# Patient Record
Sex: Female | Born: 1975 | Race: Black or African American | Hispanic: No | Marital: Single | Smoking: Current every day smoker
Health system: Southern US, Community
[De-identification: ages and names within clinical notes are randomized; demographics above are authoritative.]

## PROBLEM LIST (undated history)

## (undated) DIAGNOSIS — D696 Thrombocytopenia, unspecified: Secondary | ICD-10-CM

## (undated) DIAGNOSIS — F319 Bipolar disorder, unspecified: Secondary | ICD-10-CM

## (undated) DIAGNOSIS — K859 Acute pancreatitis without necrosis or infection, unspecified: Secondary | ICD-10-CM

## (undated) DIAGNOSIS — F1010 Alcohol abuse, uncomplicated: Secondary | ICD-10-CM

## (undated) DIAGNOSIS — F1910 Other psychoactive substance abuse, uncomplicated: Secondary | ICD-10-CM

## (undated) HISTORY — PX: NO PAST SURGERIES: SHX2092

---

## 2011-10-29 ENCOUNTER — Emergency Department: Payer: Self-pay | Admitting: Emergency Medicine

## 2012-02-16 ENCOUNTER — Inpatient Hospital Stay: Payer: Self-pay | Admitting: Internal Medicine

## 2012-02-16 LAB — SALICYLATE LEVEL
Salicylates, Serum: 4.2 mg/dL — ABNORMAL HIGH
Salicylates, Serum: 3.2 mg/dL — ABNORMAL HIGH
Salicylates, Serum: <1.7 mg/dL

## 2012-02-16 LAB — COMPREHENSIVE METABOLIC PANEL
Albumin: 4.3 g/dL (ref 3.4–5.0)
Alkaline Phosphatase: 77 U/L (ref 50–136)
Anion Gap: 34 — ABNORMAL HIGH (ref 7–16)
Bilirubin,Total: 0.9 mg/dL (ref 0.2–1.0)
Calcium, Total: 8.4 mg/dL — ABNORMAL LOW (ref 8.5–10.1)
Chloride: 95 mmol/L — ABNORMAL LOW (ref 98–107)
Co2: 4 mmol/L — CL (ref 21–32)
Creatinine: 0.52 mg/dL — ABNORMAL LOW (ref 0.60–1.30)
Glucose: 78 mg/dL (ref 65–99)
Osmolality: 263 (ref 275–301)
Potassium: 3.8 mmol/L (ref 3.5–5.1)
Sodium: 133 mmol/L — ABNORMAL LOW (ref 136–145)
Total Protein: 9.5 g/dL — ABNORMAL HIGH (ref 6.4–8.2)

## 2012-02-16 LAB — ETHANOL
Ethanol %: 0.05 % (ref 0.000–0.080)
Ethanol %: <0.003 % (ref 0.000–0.080)

## 2012-02-16 LAB — DRUG SCREEN, URINE
Amphetamines, Ur Screen: NEGATIVE (ref ?–1000)
Cocaine Metabolite,Ur ~~LOC~~: NEGATIVE (ref ?–300)
MDMA (Ecstasy)Ur Screen: NEGATIVE (ref ?–500)
Methadone, Ur Screen: NEGATIVE (ref ?–300)
Phencyclidine (PCP) Ur S: NEGATIVE (ref ?–25)

## 2012-02-16 LAB — URINALYSIS, COMPLETE
Bacteria: NONE SEEN
Bilirubin,UR: NEGATIVE
Hyaline Cast: 3
Leukocyte Esterase: NEGATIVE
Ph: 6 (ref 4.5–8.0)
Protein: 30
RBC,UR: 1 /HPF (ref 0–5)
Specific Gravity: 1.005 (ref 1.003–1.030)
Squamous Epithelial: <1
WBC UR: <1 /HPF (ref 0–5)

## 2012-02-16 LAB — FOLATE: Folic Acid: 4.7 ng/mL (ref 3.1–100.0)

## 2012-02-16 LAB — BASIC METABOLIC PANEL
Anion Gap: 12 (ref 7–16)
BUN: 4 mg/dL — ABNORMAL LOW (ref 7–18)
BUN: 3 mg/dL — ABNORMAL LOW (ref 7–18)
Calcium, Total: 8.1 mg/dL — ABNORMAL LOW (ref 8.5–10.1)
Creatinine: 0.46 mg/dL — ABNORMAL LOW (ref 0.60–1.30)
Creatinine: 0.32 mg/dL — ABNORMAL LOW (ref 0.60–1.30)
EGFR (Non-African Amer.): >60
EGFR (Non-African Amer.): >60
Glucose: 136 mg/dL — ABNORMAL HIGH (ref 65–99)
Potassium: 3.4 mmol/L — ABNORMAL LOW (ref 3.5–5.1)
Potassium: 3 mmol/L — ABNORMAL LOW (ref 3.5–5.1)
Sodium: 135 mmol/L — ABNORMAL LOW (ref 136–145)

## 2012-02-16 LAB — LACTATE DEHYDROGENASE: LDH: 296 U/L — ABNORMAL HIGH (ref 84–246)

## 2012-02-16 LAB — CBC WITH DIFFERENTIAL/PLATELET
Basophil #: 0.1 10*3/uL (ref 0.0–0.1)
Eosinophil #: 0 10*3/uL (ref 0.0–0.7)
HGB: 13.9 g/dL (ref 12.0–16.0)
Lymphocyte #: 0.6 10*3/uL — ABNORMAL LOW (ref 1.0–3.6)
MCH: 35.6 pg — ABNORMAL HIGH (ref 26.0–34.0)
MCHC: 32.3 g/dL (ref 32.0–36.0)
Monocyte %: 3.9 %
Neutrophil #: 13.8 10*3/uL — ABNORMAL HIGH (ref 1.4–6.5)
Neutrophil %: 91.8 %
Platelet: 177 10*3/uL (ref 150–440)
RDW: 14.5 % (ref 11.5–14.5)
WBC: 15 10*3/uL — ABNORMAL HIGH (ref 3.6–11.0)

## 2012-02-16 LAB — AMMONIA: Ammonia, Plasma: 92 mcmol/L — ABNORMAL HIGH (ref 11–32)

## 2012-02-16 LAB — LIPASE, BLOOD: Lipase: 108 U/L (ref 73–393)

## 2012-02-16 LAB — ACETAMINOPHEN LEVEL: Acetaminophen: <2 ug/mL

## 2012-02-17 LAB — COMPREHENSIVE METABOLIC PANEL
Albumin: 3.4 g/dL (ref 3.4–5.0)
Creatinine: 0.58 mg/dL — ABNORMAL LOW (ref 0.60–1.30)
EGFR (African American): >60
EGFR (Non-African Amer.): >60
Potassium: 2.8 mmol/L — ABNORMAL LOW (ref 3.5–5.1)
SGPT (ALT): 32 U/L
Total Protein: 7 g/dL (ref 6.4–8.2)

## 2012-02-17 LAB — CBC WITH DIFFERENTIAL/PLATELET
Basophil %: 0.1 %
Eosinophil #: 0 10*3/uL (ref 0.0–0.7)
Eosinophil %: 0.3 %
Lymphocyte #: 1.7 10*3/uL (ref 1.0–3.6)
Lymphocyte %: 23.7 %
MCHC: 35.5 g/dL (ref 32.0–36.0)
MCV: 105 fL — ABNORMAL HIGH (ref 80–100)
Monocyte #: 0.5 x10 3/mm (ref 0.2–0.9)
Neutrophil #: 4.9 10*3/uL (ref 1.4–6.5)
Neutrophil %: 68.9 %
RBC: 2.98 10*6/uL — ABNORMAL LOW (ref 3.80–5.20)
WBC: 7.1 10*3/uL (ref 3.6–11.0)

## 2012-02-17 LAB — HEMOGLOBIN A1C: Hemoglobin A1C: 4.6 % (ref 4.2–6.3)

## 2012-02-17 LAB — SALICYLATE LEVEL: Salicylates, Serum: <1.7 mg/dL

## 2012-02-18 LAB — BASIC METABOLIC PANEL
Anion Gap: 5 — ABNORMAL LOW (ref 7–16)
BUN: 3 mg/dL — ABNORMAL LOW (ref 7–18)
Calcium, Total: 8.4 mg/dL — ABNORMAL LOW (ref 8.5–10.1)
EGFR (African American): >60
EGFR (Non-African Amer.): >60
Glucose: 99 mg/dL (ref 65–99)
Osmolality: 274 (ref 275–301)
Potassium: 3.4 mmol/L — ABNORMAL LOW (ref 3.5–5.1)

## 2012-02-18 LAB — AMMONIA: Ammonia, Plasma: <25 mcmol/L (ref 11–32)

## 2012-02-19 LAB — CBC WITH DIFFERENTIAL/PLATELET
Basophil #: 0 10*3/uL (ref 0.0–0.1)
Basophil %: 0.3 %
Eosinophil #: 0.2 10*3/uL (ref 0.0–0.7)
Eosinophil %: 1.9 %
Lymphocyte #: 1.8 10*3/uL (ref 1.0–3.6)
MCHC: 33.7 g/dL (ref 32.0–36.0)
Monocyte #: 0.5 x10 3/mm (ref 0.2–0.9)
Neutrophil #: 5.8 10*3/uL (ref 1.4–6.5)
RBC: 2.39 10*6/uL — ABNORMAL LOW (ref 3.80–5.20)

## 2012-02-19 LAB — BASIC METABOLIC PANEL
BUN: 3 mg/dL — ABNORMAL LOW (ref 7–18)
Calcium, Total: 8.3 mg/dL — ABNORMAL LOW (ref 8.5–10.1)
Chloride: 112 mmol/L — ABNORMAL HIGH (ref 98–107)
Co2: 25 mmol/L (ref 21–32)
Creatinine: 0.48 mg/dL — ABNORMAL LOW (ref 0.60–1.30)
EGFR (African American): >60
EGFR (Non-African Amer.): >60
Glucose: 90 mg/dL (ref 65–99)
Potassium: 3.7 mmol/L (ref 3.5–5.1)
Sodium: 146 mmol/L — ABNORMAL HIGH (ref 136–145)

## 2012-02-20 ENCOUNTER — Inpatient Hospital Stay: Payer: Self-pay | Admitting: Psychiatry

## 2012-02-20 LAB — CBC WITH DIFFERENTIAL/PLATELET
Basophil #: 0.1 10*3/uL (ref 0.0–0.1)
Basophil %: 1.3 %
Eosinophil #: 0.2 10*3/uL (ref 0.0–0.7)
HCT: 27 % — ABNORMAL LOW (ref 35.0–47.0)
MCH: 36.8 pg — ABNORMAL HIGH (ref 26.0–34.0)
Neutrophil #: 8.3 10*3/uL — ABNORMAL HIGH (ref 1.4–6.5)
Neutrophil %: 80.2 %
RDW: 14.2 % (ref 11.5–14.5)

## 2012-02-20 LAB — COMPREHENSIVE METABOLIC PANEL
Albumin: 3.1 g/dL — ABNORMAL LOW (ref 3.4–5.0)
Alkaline Phosphatase: 64 U/L (ref 50–136)
BUN: 4 mg/dL — ABNORMAL LOW (ref 7–18)
Bilirubin,Total: 0.5 mg/dL (ref 0.2–1.0)
Co2: 22 mmol/L (ref 21–32)
EGFR (African American): >60
Osmolality: 280 (ref 275–301)
Potassium: 4 mmol/L (ref 3.5–5.1)
SGOT(AST): 55 U/L — ABNORMAL HIGH (ref 15–37)
SGPT (ALT): 39 U/L
Sodium: 142 mmol/L (ref 136–145)
Total Protein: 6.8 g/dL (ref 6.4–8.2)

## 2012-02-21 LAB — URINALYSIS, COMPLETE
Glucose,UR: NEGATIVE mg/dL (ref 0–75)
Protein: NEGATIVE
RBC,UR: 8 /HPF (ref 0–5)
Specific Gravity: 1.017 (ref 1.003–1.030)
Squamous Epithelial: 2

## 2012-02-22 LAB — CBC WITH DIFFERENTIAL/PLATELET
Basophil #: 0 10*3/uL (ref 0.0–0.1)
Eosinophil %: 1.6 %
HCT: 31.5 % — ABNORMAL LOW (ref 35.0–47.0)
HGB: 10.7 g/dL — ABNORMAL LOW (ref 12.0–16.0)
Lymphocyte #: 1.8 10*3/uL (ref 1.0–3.6)
Lymphocyte %: 22 %
MCH: 36.5 pg — ABNORMAL HIGH (ref 26.0–34.0)
MCHC: 33.9 g/dL (ref 32.0–36.0)
MCV: 108 fL — ABNORMAL HIGH (ref 80–100)
Monocyte #: 1 x10 3/mm — ABNORMAL HIGH (ref 0.2–0.9)
Neutrophil %: 64.1 %

## 2012-02-23 LAB — OCCULT BLOOD X 1 CARD TO LAB, STOOL: Occult Blood, Feces: NEGATIVE

## 2012-02-25 LAB — URINALYSIS, COMPLETE
Bilirubin,UR: NEGATIVE
Blood: NEGATIVE
Ph: 6 (ref 4.5–8.0)
Protein: NEGATIVE
Squamous Epithelial: 1
WBC UR: 2 /HPF (ref 0–5)

## 2012-02-25 LAB — CBC WITH DIFFERENTIAL/PLATELET
Basophil %: 0.7 %
Eosinophil %: 0.7 %
HCT: 30.3 % — ABNORMAL LOW (ref 35.0–47.0)
HGB: 10.3 g/dL — ABNORMAL LOW (ref 12.0–16.0)
Lymphocyte #: 1.7 10*3/uL (ref 1.0–3.6)
MCH: 36.3 pg — ABNORMAL HIGH (ref 26.0–34.0)
MCHC: 34 g/dL (ref 32.0–36.0)
MCV: 107 fL — ABNORMAL HIGH (ref 80–100)
Monocyte #: 1.1 x10 3/mm — ABNORMAL HIGH (ref 0.2–0.9)
Monocyte %: 14.9 %
Neutrophil #: 4.2 10*3/uL (ref 1.4–6.5)
Neutrophil %: 59.9 %
Platelet: 375 10*3/uL (ref 150–440)
RDW: 14.4 % (ref 11.5–14.5)
WBC: 7.1 10*3/uL (ref 3.6–11.0)

## 2012-02-25 LAB — COMPREHENSIVE METABOLIC PANEL
Albumin: 3.4 g/dL (ref 3.4–5.0)
Alkaline Phosphatase: 62 U/L (ref 50–136)
Anion Gap: 9 (ref 7–16)
Calcium, Total: 8.9 mg/dL (ref 8.5–10.1)
Co2: 24 mmol/L (ref 21–32)
EGFR (Non-African Amer.): >60
Glucose: 78 mg/dL (ref 65–99)
Osmolality: 276 (ref 275–301)
Potassium: 3.7 mmol/L (ref 3.5–5.1)
SGPT (ALT): 36 U/L
Sodium: 140 mmol/L (ref 136–145)
Total Protein: 7.5 g/dL (ref 6.4–8.2)

## 2012-02-25 LAB — MAGNESIUM: Magnesium: 2 mg/dL

## 2012-02-26 LAB — CBC WITH DIFFERENTIAL/PLATELET
Basophil #: 0 10*3/uL (ref 0.0–0.1)
Basophil %: 0.7 %
Eosinophil #: 0.1 10*3/uL (ref 0.0–0.7)
Eosinophil %: 1.1 %
Lymphocyte %: 25.9 %
MCH: 36.6 pg — ABNORMAL HIGH (ref 26.0–34.0)
MCHC: 33.3 g/dL (ref 32.0–36.0)
Monocyte %: 11.2 %
Platelet: 394 10*3/uL (ref 150–440)
RBC: 3.01 10*6/uL — ABNORMAL LOW (ref 3.80–5.20)
RDW: 14.5 % (ref 11.5–14.5)
WBC: 6.8 10*3/uL (ref 3.6–11.0)

## 2012-02-26 LAB — URINE CULTURE

## 2012-02-26 LAB — BASIC METABOLIC PANEL
BUN: 4 mg/dL — ABNORMAL LOW (ref 7–18)
Calcium, Total: 8.8 mg/dL (ref 8.5–10.1)
Chloride: 105 mmol/L (ref 98–107)
Co2: 24 mmol/L (ref 21–32)
Creatinine: 0.55 mg/dL — ABNORMAL LOW (ref 0.60–1.30)
EGFR (African American): >60
EGFR (Non-African Amer.): >60
Osmolality: 276 (ref 275–301)

## 2012-06-29 ENCOUNTER — Inpatient Hospital Stay: Payer: Self-pay | Admitting: Student

## 2012-06-29 LAB — COMPREHENSIVE METABOLIC PANEL
Albumin: 3.9 g/dL (ref 3.4–5.0)
Alkaline Phosphatase: 77 U/L (ref 50–136)
BUN: 5 mg/dL — ABNORMAL LOW (ref 7–18)
Calcium, Total: 9 mg/dL (ref 8.5–10.1)
Co2: 9 mmol/L — CL (ref 21–32)
Creatinine: 0.59 mg/dL — ABNORMAL LOW (ref 0.60–1.30)
EGFR (Non-African Amer.): >60
Osmolality: 263 (ref 275–301)
Potassium: 4 mmol/L (ref 3.5–5.1)
SGOT(AST): 46 U/L — ABNORMAL HIGH (ref 15–37)
SGPT (ALT): 25 U/L (ref 12–78)
Total Protein: 9.7 g/dL — ABNORMAL HIGH (ref 6.4–8.2)

## 2012-06-29 LAB — ACETAMINOPHEN LEVEL: Acetaminophen: <2 ug/mL

## 2012-06-29 LAB — DRUG SCREEN, URINE
Amphetamines, Ur Screen: NEGATIVE (ref ?–1000)
Benzodiazepine, Ur Scrn: NEGATIVE (ref ?–200)
Cannabinoid 50 Ng, Ur ~~LOC~~: POSITIVE (ref ?–50)
MDMA (Ecstasy)Ur Screen: NEGATIVE (ref ?–500)
Phencyclidine (PCP) Ur S: NEGATIVE (ref ?–25)
Tricyclic, Ur Screen: NEGATIVE (ref ?–1000)

## 2012-06-29 LAB — BASIC METABOLIC PANEL
Anion Gap: 12 (ref 7–16)
BUN: 2 mg/dL — ABNORMAL LOW (ref 7–18)
Chloride: 92 mmol/L — ABNORMAL LOW (ref 98–107)
Creatinine: 0.5 mg/dL — ABNORMAL LOW (ref 0.60–1.30)
EGFR (Non-African Amer.): >60
Glucose: 256 mg/dL — ABNORMAL HIGH (ref 65–99)
Osmolality: 269 (ref 275–301)
Potassium: 3.4 mmol/L — ABNORMAL LOW (ref 3.5–5.1)
Sodium: 132 mmol/L — ABNORMAL LOW (ref 136–145)

## 2012-06-29 LAB — CBC
MCH: 34.8 pg — ABNORMAL HIGH (ref 26.0–34.0)
MCHC: 34 g/dL (ref 32.0–36.0)
Platelet: 352 10*3/uL (ref 150–440)
RDW: 14.3 % (ref 11.5–14.5)
WBC: 23.5 10*3/uL — ABNORMAL HIGH (ref 3.6–11.0)

## 2012-06-29 LAB — URINALYSIS, COMPLETE
Leukocyte Esterase: NEGATIVE
Nitrite: NEGATIVE
Ph: 5 (ref 4.5–8.0)
Protein: 100
RBC,UR: 2 /HPF (ref 0–5)
Specific Gravity: 1.013 (ref 1.003–1.030)
Squamous Epithelial: NONE SEEN

## 2012-06-29 LAB — ETHANOL
Ethanol %: 0.28 % — ABNORMAL HIGH (ref 0.000–0.080)
Ethanol: 280 mg/dL

## 2012-06-29 LAB — TSH: Thyroid Stimulating Horm: 0.28 u[IU]/mL — ABNORMAL LOW

## 2012-06-29 LAB — SALICYLATE LEVEL: Salicylates, Serum: 2.4 mg/dL

## 2012-06-29 LAB — PREGNANCY, URINE: Pregnancy Test, Urine: NEGATIVE m[IU]/mL

## 2012-06-29 LAB — LITHIUM LEVEL: Lithium: <0.2 mmol/L — ABNORMAL LOW

## 2012-06-29 LAB — T4, FREE: Free Thyroxine: 0.71 ng/dL — ABNORMAL LOW (ref 0.76–1.46)

## 2012-06-29 LAB — HEMOGLOBIN: HGB: 12.2 g/dL (ref 12.0–16.0)

## 2012-06-30 LAB — BASIC METABOLIC PANEL
Anion Gap: 8 (ref 7–16)
BUN: 3 mg/dL — ABNORMAL LOW (ref 7–18)
Calcium, Total: 8.2 mg/dL — ABNORMAL LOW (ref 8.5–10.1)
Co2: 35 mmol/L — ABNORMAL HIGH (ref 21–32)
Creatinine: 0.75 mg/dL (ref 0.60–1.30)
EGFR (African American): >60
EGFR (Non-African Amer.): >60
Glucose: 311 mg/dL — ABNORMAL HIGH (ref 65–99)
Potassium: 3.3 mmol/L — ABNORMAL LOW (ref 3.5–5.1)
Sodium: 134 mmol/L — ABNORMAL LOW (ref 136–145)

## 2012-06-30 LAB — CBC WITH DIFFERENTIAL/PLATELET
Basophil #: 0 10*3/uL (ref 0.0–0.1)
Eosinophil #: 0 10*3/uL (ref 0.0–0.7)
Eosinophil %: 0 %
HCT: 33.6 % — ABNORMAL LOW (ref 35.0–47.0)
Lymphocyte #: 1 10*3/uL (ref 1.0–3.6)
MCH: 35.6 pg — ABNORMAL HIGH (ref 26.0–34.0)
MCV: 99 fL (ref 80–100)
Monocyte %: 8.3 %
Neutrophil #: 11.6 10*3/uL — ABNORMAL HIGH (ref 1.4–6.5)
RBC: 3.4 10*6/uL — ABNORMAL LOW (ref 3.80–5.20)
RDW: 14.5 % (ref 11.5–14.5)
WBC: 13.8 10*3/uL — ABNORMAL HIGH (ref 3.6–11.0)

## 2012-06-30 LAB — MAGNESIUM: Magnesium: 1.5 mg/dL — ABNORMAL LOW

## 2012-07-01 LAB — BASIC METABOLIC PANEL
Anion Gap: 7 (ref 7–16)
Anion Gap: 4 — ABNORMAL LOW (ref 7–16)
BUN: 5 mg/dL — ABNORMAL LOW (ref 7–18)
Calcium, Total: 8.7 mg/dL (ref 8.5–10.1)
Chloride: 99 mmol/L (ref 98–107)
Co2: 35 mmol/L — ABNORMAL HIGH (ref 21–32)
Creatinine: 0.62 mg/dL (ref 0.60–1.30)
Creatinine: 0.84 mg/dL (ref 0.60–1.30)
EGFR (African American): >60
EGFR (Non-African Amer.): >60
Glucose: 116 mg/dL — ABNORMAL HIGH (ref 65–99)
Osmolality: 273 (ref 275–301)
Potassium: 3 mmol/L — ABNORMAL LOW (ref 3.5–5.1)
Sodium: 137 mmol/L (ref 136–145)

## 2012-07-01 LAB — CBC WITH DIFFERENTIAL/PLATELET
Basophil #: 0.1 10*3/uL (ref 0.0–0.1)
Basophil %: 0.7 %
Eosinophil #: 0 10*3/uL (ref 0.0–0.7)
Eosinophil %: 0.1 %
HCT: 35.4 % (ref 35.0–47.0)
HGB: 12.4 g/dL (ref 12.0–16.0)
Lymphocyte #: 1.5 10*3/uL (ref 1.0–3.6)
Lymphocyte %: 15.9 %
MCH: 35.4 pg — ABNORMAL HIGH (ref 26.0–34.0)
MCHC: 35.1 g/dL (ref 32.0–36.0)
MCV: 101 fL — ABNORMAL HIGH (ref 80–100)
Monocyte #: 0.7 x10 3/mm (ref 0.2–0.9)
Monocyte %: 7.9 %
Neutrophil #: 7 10*3/uL — ABNORMAL HIGH (ref 1.4–6.5)
Neutrophil %: 75.4 %
RBC: 3.52 10*6/uL — ABNORMAL LOW (ref 3.80–5.20)
RDW: 14.7 % — ABNORMAL HIGH (ref 11.5–14.5)
WBC: 9.3 10*3/uL (ref 3.6–11.0)

## 2012-07-01 LAB — PROTIME-INR
INR: 0.9
Prothrombin Time: 12.8 secs (ref 11.5–14.7)

## 2012-07-29 ENCOUNTER — Emergency Department: Payer: Self-pay

## 2012-07-29 LAB — COMPREHENSIVE METABOLIC PANEL
Alkaline Phosphatase: 55 U/L (ref 50–136)
Anion Gap: 12 (ref 7–16)
Calcium, Total: 8.4 mg/dL — ABNORMAL LOW (ref 8.5–10.1)
Chloride: 104 mmol/L (ref 98–107)
Co2: 20 mmol/L — ABNORMAL LOW (ref 21–32)
Creatinine: 0.58 mg/dL — ABNORMAL LOW (ref 0.60–1.30)
EGFR (African American): >60
EGFR (Non-African Amer.): >60
SGOT(AST): 24 U/L (ref 15–37)
SGPT (ALT): 13 U/L (ref 12–78)
Total Protein: 7.3 g/dL (ref 6.4–8.2)

## 2012-07-29 LAB — CBC
HCT: 35.4 % (ref 35.0–47.0)
HGB: 11.9 g/dL — ABNORMAL LOW (ref 12.0–16.0)
MCH: 34.4 pg — ABNORMAL HIGH (ref 26.0–34.0)
MCHC: 33.6 g/dL (ref 32.0–36.0)
Platelet: 163 10*3/uL (ref 150–440)
RBC: 3.46 10*6/uL — ABNORMAL LOW (ref 3.80–5.20)
RDW: 13.6 % (ref 11.5–14.5)

## 2012-07-29 LAB — URINALYSIS, COMPLETE
Glucose,UR: NEGATIVE mg/dL (ref 0–75)
Ketone: NEGATIVE
Leukocyte Esterase: NEGATIVE
Nitrite: NEGATIVE
Ph: 7 (ref 4.5–8.0)
WBC UR: 1 /HPF (ref 0–5)

## 2012-07-29 LAB — DRUG SCREEN, URINE
Amphetamines, Ur Screen: NEGATIVE (ref ?–1000)
Benzodiazepine, Ur Scrn: NEGATIVE (ref ?–200)
Cocaine Metabolite,Ur ~~LOC~~: NEGATIVE (ref ?–300)
MDMA (Ecstasy)Ur Screen: NEGATIVE (ref ?–500)
Methadone, Ur Screen: NEGATIVE (ref ?–300)
Phencyclidine (PCP) Ur S: NEGATIVE (ref ?–25)
Tricyclic, Ur Screen: NEGATIVE (ref ?–1000)

## 2012-07-29 LAB — ETHANOL
Ethanol %: 0.125 % — ABNORMAL HIGH (ref 0.000–0.080)
Ethanol: 125 mg/dL

## 2012-07-29 LAB — TROPONIN I: Troponin-I: <0.02 ng/mL

## 2012-07-29 LAB — LIPASE, BLOOD: Lipase: 64 U/L — ABNORMAL LOW (ref 73–393)

## 2012-10-09 ENCOUNTER — Observation Stay: Payer: Self-pay | Admitting: Family Medicine

## 2012-10-09 LAB — DRUG SCREEN, URINE
Barbiturates, Ur Screen: NEGATIVE (ref ?–200)
Cocaine Metabolite,Ur ~~LOC~~: NEGATIVE (ref ?–300)
Methadone, Ur Screen: NEGATIVE (ref ?–300)
Opiate, Ur Screen: POSITIVE (ref ?–300)
Phencyclidine (PCP) Ur S: NEGATIVE (ref ?–25)
Tricyclic, Ur Screen: NEGATIVE (ref ?–1000)

## 2012-10-09 LAB — COMPREHENSIVE METABOLIC PANEL
Alkaline Phosphatase: 66 U/L (ref 50–136)
Calcium, Total: 7.8 mg/dL — ABNORMAL LOW (ref 8.5–10.1)
Chloride: 109 mmol/L — ABNORMAL HIGH (ref 98–107)
Co2: 21 mmol/L (ref 21–32)
Creatinine: 0.7 mg/dL (ref 0.60–1.30)
EGFR (African American): >60
EGFR (Non-African Amer.): >60
Potassium: 4.2 mmol/L (ref 3.5–5.1)
SGOT(AST): 33 U/L (ref 15–37)
SGPT (ALT): 17 U/L (ref 12–78)
Sodium: 142 mmol/L (ref 136–145)
Total Protein: 7.6 g/dL (ref 6.4–8.2)

## 2012-10-09 LAB — CBC
HGB: 11 g/dL — ABNORMAL LOW (ref 12.0–16.0)
MCV: 100 fL (ref 80–100)
Platelet: 229 10*3/uL (ref 150–440)
RDW: 14.7 % — ABNORMAL HIGH (ref 11.5–14.5)
WBC: 11.2 10*3/uL — ABNORMAL HIGH (ref 3.6–11.0)

## 2012-10-09 LAB — URINALYSIS, COMPLETE
Nitrite: NEGATIVE
Ph: 8 (ref 4.5–8.0)
Protein: 100
Specific Gravity: 1.032 (ref 1.003–1.030)
WBC UR: 6 /HPF (ref 0–5)

## 2012-10-09 LAB — PREGNANCY, URINE: Pregnancy Test, Urine: NEGATIVE m[IU]/mL

## 2012-10-09 LAB — TROPONIN I: Troponin-I: <0.02 ng/mL

## 2012-10-10 LAB — COMPREHENSIVE METABOLIC PANEL
Albumin: 3.1 g/dL — ABNORMAL LOW (ref 3.4–5.0)
Alkaline Phosphatase: 58 U/L (ref 50–136)
Anion Gap: 9 (ref 7–16)
Calcium, Total: 7.2 mg/dL — ABNORMAL LOW (ref 8.5–10.1)
Co2: 22 mmol/L (ref 21–32)
EGFR (African American): >60
EGFR (Non-African Amer.): >60
Glucose: 166 mg/dL — ABNORMAL HIGH (ref 65–99)
Potassium: 2.6 mmol/L — ABNORMAL LOW (ref 3.5–5.1)
SGOT(AST): 15 U/L (ref 15–37)

## 2012-10-10 LAB — CBC WITH DIFFERENTIAL/PLATELET
Basophil %: 0.7 %
Eosinophil %: 1 %
Lymphocyte %: 25.2 %
MCH: 37.4 pg — ABNORMAL HIGH (ref 26.0–34.0)
Monocyte %: 5.6 %
Neutrophil %: 67.5 %
Platelet: 127 10*3/uL — ABNORMAL LOW (ref 150–440)
WBC: 9.1 10*3/uL (ref 3.6–11.0)

## 2012-10-10 LAB — LITHIUM LEVEL: Lithium: 0.5 mmol/L — ABNORMAL LOW

## 2012-10-16 ENCOUNTER — Emergency Department: Payer: Self-pay | Admitting: Emergency Medicine

## 2012-10-16 LAB — LIPASE, BLOOD: Lipase: 64 U/L — ABNORMAL LOW (ref 73–393)

## 2012-10-16 LAB — CBC
HCT: 32.7 % — ABNORMAL LOW (ref 35.0–47.0)
HGB: 11.4 g/dL — ABNORMAL LOW (ref 12.0–16.0)
MCHC: 34.8 g/dL (ref 32.0–36.0)
Platelet: 280 10*3/uL (ref 150–440)
RBC: 3.25 10*6/uL — ABNORMAL LOW (ref 3.80–5.20)

## 2012-10-16 LAB — COMPREHENSIVE METABOLIC PANEL
Albumin: 3.7 g/dL (ref 3.4–5.0)
Anion Gap: 11 (ref 7–16)
Calcium, Total: 8.8 mg/dL (ref 8.5–10.1)
Chloride: 109 mmol/L — ABNORMAL HIGH (ref 98–107)
Glucose: 117 mg/dL — ABNORMAL HIGH (ref 65–99)
Osmolality: 282 (ref 275–301)
Potassium: 3 mmol/L — ABNORMAL LOW (ref 3.5–5.1)
Total Protein: 7.8 g/dL (ref 6.4–8.2)

## 2012-10-16 LAB — URINALYSIS, COMPLETE
Bacteria: NONE SEEN
Bilirubin,UR: NEGATIVE
Blood: NEGATIVE
Glucose,UR: NEGATIVE mg/dL (ref 0–75)
Nitrite: NEGATIVE
Ph: 9 (ref 4.5–8.0)
Specific Gravity: 1.023 (ref 1.003–1.030)
Squamous Epithelial: 25
WBC UR: 2 /HPF (ref 0–5)

## 2012-10-16 LAB — PREGNANCY, URINE: Pregnancy Test, Urine: NEGATIVE m[IU]/mL

## 2012-10-16 LAB — LITHIUM LEVEL: Lithium: <0.2 mmol/L — ABNORMAL LOW

## 2013-01-28 ENCOUNTER — Emergency Department: Payer: Self-pay | Admitting: Emergency Medicine

## 2013-01-31 ENCOUNTER — Inpatient Hospital Stay: Payer: Self-pay | Admitting: Internal Medicine

## 2013-01-31 LAB — CBC
HCT: 35.4 % (ref 35.0–47.0)
MCH: 37.4 pg — ABNORMAL HIGH (ref 26.0–34.0)
Platelet: 637 10*3/uL — ABNORMAL HIGH (ref 150–440)
RDW: 17 % — ABNORMAL HIGH (ref 11.5–14.5)
WBC: 18.3 10*3/uL — ABNORMAL HIGH (ref 3.6–11.0)

## 2013-01-31 LAB — PREGNANCY, URINE: Pregnancy Test, Urine: NEGATIVE m[IU]/mL

## 2013-01-31 LAB — COMPREHENSIVE METABOLIC PANEL
Alkaline Phosphatase: 88 U/L (ref 50–136)
BUN: 8 mg/dL (ref 7–18)
Bilirubin,Total: 0.6 mg/dL (ref 0.2–1.0)
Calcium, Total: 8.2 mg/dL — ABNORMAL LOW (ref 8.5–10.1)
Chloride: 102 mmol/L (ref 98–107)
Creatinine: 0.45 mg/dL — ABNORMAL LOW (ref 0.60–1.30)
Glucose: 70 mg/dL (ref 65–99)
Osmolality: 274 (ref 275–301)
Potassium: 3.3 mmol/L — ABNORMAL LOW (ref 3.5–5.1)
SGOT(AST): 100 U/L — ABNORMAL HIGH (ref 15–37)
SGPT (ALT): 39 U/L (ref 12–78)
Sodium: 139 mmol/L (ref 136–145)
Total Protein: 8.4 g/dL — ABNORMAL HIGH (ref 6.4–8.2)

## 2013-01-31 LAB — DRUG SCREEN, URINE
Amphetamines, Ur Screen: NEGATIVE (ref ?–1000)
Barbiturates, Ur Screen: NEGATIVE (ref ?–200)
Benzodiazepine, Ur Scrn: NEGATIVE (ref ?–200)
Cannabinoid 50 Ng, Ur ~~LOC~~: POSITIVE (ref ?–50)
Cocaine Metabolite,Ur ~~LOC~~: NEGATIVE (ref ?–300)
MDMA (Ecstasy)Ur Screen: NEGATIVE (ref ?–500)
Methadone, Ur Screen: POSITIVE (ref ?–300)
Tricyclic, Ur Screen: NEGATIVE (ref ?–1000)

## 2013-01-31 LAB — TSH: Thyroid Stimulating Horm: 0.2 u[IU]/mL — ABNORMAL LOW

## 2013-01-31 LAB — BASIC METABOLIC PANEL
BUN: 5 mg/dL — ABNORMAL LOW (ref 7–18)
Calcium, Total: 8 mg/dL — ABNORMAL LOW (ref 8.5–10.1)
Creatinine: 0.63 mg/dL (ref 0.60–1.30)
EGFR (African American): >60
EGFR (Non-African Amer.): >60
Glucose: 180 mg/dL — ABNORMAL HIGH (ref 65–99)
Osmolality: 277 (ref 275–301)
Potassium: 3.4 mmol/L — ABNORMAL LOW (ref 3.5–5.1)

## 2013-01-31 LAB — ETHANOL
Ethanol %: 0.294 % — ABNORMAL HIGH (ref 0.000–0.080)
Ethanol: 294 mg/dL

## 2013-01-31 LAB — SALICYLATE LEVEL: Salicylates, Serum: 2.3 mg/dL

## 2013-01-31 LAB — LITHIUM LEVEL: Lithium: <0.2 mmol/L — ABNORMAL LOW

## 2013-02-01 LAB — COMPREHENSIVE METABOLIC PANEL
Albumin: 2.7 g/dL — ABNORMAL LOW (ref 3.4–5.0)
BUN: 3 mg/dL — ABNORMAL LOW (ref 7–18)
Bilirubin,Total: 1.9 mg/dL — ABNORMAL HIGH (ref 0.2–1.0)
Calcium, Total: 7.7 mg/dL — ABNORMAL LOW (ref 8.5–10.1)
Chloride: 111 mmol/L — ABNORMAL HIGH (ref 98–107)
Co2: 23 mmol/L (ref 21–32)
EGFR (African American): >60
EGFR (Non-African Amer.): >60
SGOT(AST): 50 U/L — ABNORMAL HIGH (ref 15–37)
SGPT (ALT): 23 U/L (ref 12–78)
Total Protein: 5.9 g/dL — ABNORMAL LOW (ref 6.4–8.2)

## 2013-02-01 LAB — CBC WITH DIFFERENTIAL/PLATELET
Basophil #: 0.1 10*3/uL (ref 0.0–0.1)
Eosinophil #: 0 10*3/uL (ref 0.0–0.7)
Eosinophil %: 0 %
HGB: 8.6 g/dL — ABNORMAL LOW (ref 12.0–16.0)
Lymphocyte #: 1.6 10*3/uL (ref 1.0–3.6)
MCH: 36.8 pg — ABNORMAL HIGH (ref 26.0–34.0)
Neutrophil #: 13.4 10*3/uL — ABNORMAL HIGH (ref 1.4–6.5)
Neutrophil %: 86.3 %
Platelet: 302 10*3/uL (ref 150–440)
RBC: 2.32 10*6/uL — ABNORMAL LOW (ref 3.80–5.20)
RDW: 16.1 % — ABNORMAL HIGH (ref 11.5–14.5)
WBC: 15.6 10*3/uL — ABNORMAL HIGH (ref 3.6–11.0)

## 2013-02-01 LAB — LIPID PANEL
Cholesterol: 147 mg/dL (ref 0–200)
HDL Cholesterol: 81 mg/dL — ABNORMAL HIGH (ref 40–60)
Ldl Cholesterol, Calc: 48 mg/dL (ref 0–100)
Triglycerides: 91 mg/dL (ref 0–200)

## 2013-02-02 DIAGNOSIS — I4581 Long QT syndrome: Secondary | ICD-10-CM

## 2013-02-02 LAB — CBC WITH DIFFERENTIAL/PLATELET
Basophil #: 0 10*3/uL (ref 0.0–0.1)
Basophil %: 0.4 %
Eosinophil #: 0.1 10*3/uL (ref 0.0–0.7)
Eosinophil %: 0.9 %
Lymphocyte #: 2.3 10*3/uL (ref 1.0–3.6)
Lymphocyte %: 26.4 %
MCHC: 35 g/dL (ref 32.0–36.0)
MCV: 108 fL — ABNORMAL HIGH (ref 80–100)
Neutrophil #: 6.1 10*3/uL (ref 1.4–6.5)
Neutrophil %: 70.6 %
Platelet: 238 10*3/uL (ref 150–440)
RBC: 2.4 10*6/uL — ABNORMAL LOW (ref 3.80–5.20)
WBC: 8.6 10*3/uL (ref 3.6–11.0)

## 2013-02-02 LAB — COMPREHENSIVE METABOLIC PANEL
Anion Gap: 9 (ref 7–16)
BUN: 1 mg/dL — ABNORMAL LOW (ref 7–18)
Bilirubin,Total: 1 mg/dL (ref 0.2–1.0)
Calcium, Total: 7.8 mg/dL — ABNORMAL LOW (ref 8.5–10.1)
Chloride: 112 mmol/L — ABNORMAL HIGH (ref 98–107)
Co2: 22 mmol/L (ref 21–32)
EGFR (African American): >60
EGFR (Non-African Amer.): >60
Glucose: 180 mg/dL — ABNORMAL HIGH (ref 65–99)
Osmolality: 285 (ref 275–301)
SGPT (ALT): 24 U/L (ref 12–78)
Sodium: 143 mmol/L (ref 136–145)
Total Protein: 6.2 g/dL — ABNORMAL LOW (ref 6.4–8.2)

## 2013-02-03 DIAGNOSIS — I4581 Long QT syndrome: Secondary | ICD-10-CM

## 2013-02-03 LAB — CBC WITH DIFFERENTIAL/PLATELET
Basophil #: 0.1 10*3/uL (ref 0.0–0.1)
Basophil %: 1.3 %
Eosinophil %: 1.6 %
HCT: 27 % — ABNORMAL LOW (ref 35.0–47.0)
HGB: 9.2 g/dL — ABNORMAL LOW (ref 12.0–16.0)
MCHC: 34.1 g/dL (ref 32.0–36.0)
MCV: 109 fL — ABNORMAL HIGH (ref 80–100)
Monocyte %: 2.2 %
Neutrophil #: 4.5 10*3/uL (ref 1.4–6.5)
Neutrophil %: 64.6 %
RBC: 2.48 10*6/uL — ABNORMAL LOW (ref 3.80–5.20)
WBC: 7 10*3/uL (ref 3.6–11.0)

## 2013-02-03 LAB — URINALYSIS, COMPLETE
Bilirubin,UR: NEGATIVE
Blood: NEGATIVE
Glucose,UR: NEGATIVE mg/dL (ref 0–75)
Ketone: NEGATIVE
Nitrite: NEGATIVE
Protein: NEGATIVE
WBC UR: 1 /HPF (ref 0–5)

## 2013-02-03 LAB — BASIC METABOLIC PANEL
Calcium, Total: 7.8 mg/dL — ABNORMAL LOW (ref 8.5–10.1)
Chloride: 117 mmol/L — ABNORMAL HIGH (ref 98–107)
Co2: 22 mmol/L (ref 21–32)
Creatinine: 0.49 mg/dL — ABNORMAL LOW (ref 0.60–1.30)
EGFR (Non-African Amer.): >60

## 2013-02-03 LAB — PHOSPHORUS: Phosphorus: 1.7 mg/dL — ABNORMAL LOW (ref 2.5–4.9)

## 2013-02-04 LAB — MAGNESIUM: Magnesium: 1.5 mg/dL — ABNORMAL LOW

## 2013-02-04 LAB — CBC WITH DIFFERENTIAL/PLATELET
Basophil %: 1.6 %
Eosinophil #: 0.1 10*3/uL (ref 0.0–0.7)
Eosinophil %: 1.3 %
HCT: 25.7 % — ABNORMAL LOW (ref 35.0–47.0)
Lymphocyte #: 2.7 10*3/uL (ref 1.0–3.6)
MCHC: 35 g/dL (ref 32.0–36.0)
Monocyte #: 0.2 x10 3/mm (ref 0.2–0.9)
Platelet: 147 10*3/uL — ABNORMAL LOW (ref 150–440)
RBC: 2.4 10*6/uL — ABNORMAL LOW (ref 3.80–5.20)
WBC: 7.2 10*3/uL (ref 3.6–11.0)

## 2013-02-04 LAB — COMPREHENSIVE METABOLIC PANEL
Albumin: 2.8 g/dL — ABNORMAL LOW (ref 3.4–5.0)
Alkaline Phosphatase: 77 U/L (ref 50–136)
Bilirubin,Total: 0.4 mg/dL (ref 0.2–1.0)
Calcium, Total: 8.5 mg/dL (ref 8.5–10.1)
Chloride: 113 mmol/L — ABNORMAL HIGH (ref 98–107)
Co2: 25 mmol/L (ref 21–32)
Creatinine: 0.4 mg/dL — ABNORMAL LOW (ref 0.60–1.30)
EGFR (African American): >60
EGFR (Non-African Amer.): >60
Osmolality: 281 (ref 275–301)
Potassium: 3.4 mmol/L — ABNORMAL LOW (ref 3.5–5.1)
SGOT(AST): 47 U/L — ABNORMAL HIGH (ref 15–37)
SGPT (ALT): 34 U/L (ref 12–78)
Sodium: 143 mmol/L (ref 136–145)
Total Protein: 6.5 g/dL (ref 6.4–8.2)

## 2013-02-06 LAB — CULTURE, BLOOD (SINGLE)

## 2013-02-13 ENCOUNTER — Observation Stay: Payer: Self-pay | Admitting: Internal Medicine

## 2013-02-13 LAB — COMPREHENSIVE METABOLIC PANEL
Albumin: 3.3 g/dL — ABNORMAL LOW (ref 3.4–5.0)
Anion Gap: 16 (ref 7–16)
BUN: 2 mg/dL — ABNORMAL LOW (ref 7–18)
Bilirubin,Total: 1.2 mg/dL — ABNORMAL HIGH (ref 0.2–1.0)
Chloride: 107 mmol/L (ref 98–107)
Co2: 17 mmol/L — ABNORMAL LOW (ref 21–32)
EGFR (Non-African Amer.): >60
Osmolality: 274 (ref 275–301)
Potassium: 4 mmol/L (ref 3.5–5.1)
SGOT(AST): 40 U/L — ABNORMAL HIGH (ref 15–37)
SGPT (ALT): 23 U/L (ref 12–78)
Sodium: 140 mmol/L (ref 136–145)
Total Protein: 7.9 g/dL (ref 6.4–8.2)

## 2013-02-13 LAB — URINALYSIS, COMPLETE
Bilirubin,UR: NEGATIVE
Glucose,UR: NEGATIVE mg/dL (ref 0–75)
Leukocyte Esterase: NEGATIVE
Nitrite: NEGATIVE
Ph: 6 (ref 4.5–8.0)
Protein: NEGATIVE
Specific Gravity: 1.003 (ref 1.003–1.030)
WBC UR: NONE SEEN /HPF (ref 0–5)

## 2013-02-13 LAB — LITHIUM LEVEL: Lithium: <0.2 mmol/L — ABNORMAL LOW

## 2013-02-13 LAB — DRUG SCREEN, URINE

## 2013-02-13 LAB — CBC
HGB: 11.3 g/dL — ABNORMAL LOW (ref 12.0–16.0)
MCHC: 33.7 g/dL (ref 32.0–36.0)
Platelet: 629 10*3/uL — ABNORMAL HIGH (ref 150–440)
RBC: 3.04 10*6/uL — ABNORMAL LOW (ref 3.80–5.20)

## 2013-02-13 LAB — CK: CK, Total: 71 U/L (ref 21–215)

## 2013-02-13 LAB — ETHANOL: Ethanol %: 0.325 % (ref 0.000–0.080)

## 2013-02-13 LAB — LIPASE, BLOOD: Lipase: 81 U/L (ref 73–393)

## 2013-02-27 ENCOUNTER — Emergency Department: Payer: Self-pay | Admitting: Emergency Medicine

## 2013-02-27 LAB — BASIC METABOLIC PANEL
Anion Gap: 17 — ABNORMAL HIGH (ref 7–16)
BUN: 2 mg/dL — ABNORMAL LOW (ref 7–18)
Co2: 16 mmol/L — ABNORMAL LOW (ref 21–32)
Creatinine: 0.3 mg/dL — ABNORMAL LOW (ref 0.60–1.30)
EGFR (African American): >60
EGFR (Non-African Amer.): >60

## 2013-02-27 LAB — URINALYSIS, COMPLETE
Glucose,UR: NEGATIVE mg/dL (ref 0–75)
Ph: 6 (ref 4.5–8.0)
Protein: NEGATIVE
RBC,UR: NONE SEEN /HPF (ref 0–5)
Squamous Epithelial: <1
WBC UR: <1 /HPF (ref 0–5)

## 2013-02-27 LAB — COMPREHENSIVE METABOLIC PANEL
Alkaline Phosphatase: 61 U/L (ref 50–136)
Anion Gap: 17 — ABNORMAL HIGH (ref 7–16)
BUN: 2 mg/dL — ABNORMAL LOW (ref 7–18)
Bilirubin,Total: 2.1 mg/dL — ABNORMAL HIGH (ref 0.2–1.0)
Calcium, Total: 8.2 mg/dL — ABNORMAL LOW (ref 8.5–10.1)
Chloride: 110 mmol/L — ABNORMAL HIGH (ref 98–107)
EGFR (Non-African Amer.): >60
Glucose: 55 mg/dL — ABNORMAL LOW (ref 65–99)
Total Protein: 7.4 g/dL (ref 6.4–8.2)

## 2013-02-27 LAB — ETHANOL: Ethanol: 209 mg/dL

## 2013-02-27 LAB — CBC WITH DIFFERENTIAL/PLATELET
Basophil #: 0.2 10*3/uL — ABNORMAL HIGH (ref 0.0–0.1)
Basophil %: 1 %
Eosinophil #: 0 10*3/uL (ref 0.0–0.7)
Eosinophil %: 0.1 %
HGB: 11.4 g/dL — ABNORMAL LOW (ref 12.0–16.0)
Lymphocyte #: 1.5 10*3/uL (ref 1.0–3.6)
Lymphocyte %: 8.8 %
MCV: 106 fL — ABNORMAL HIGH (ref 80–100)
Monocyte %: 3.1 %
Neutrophil #: 14.8 10*3/uL — ABNORMAL HIGH (ref 1.4–6.5)
Neutrophil %: 87 %
Platelet: 270 10*3/uL (ref 150–440)
RBC: 3.13 10*6/uL — ABNORMAL LOW (ref 3.80–5.20)
RDW: 14.4 % (ref 11.5–14.5)

## 2013-02-27 LAB — LIPASE, BLOOD: Lipase: 125 U/L (ref 73–393)

## 2013-02-27 LAB — HCG, QUANTITATIVE, PREGNANCY: Beta Hcg, Quant.: <1 m[IU]/mL — ABNORMAL LOW

## 2013-02-27 LAB — PREGNANCY, URINE: Pregnancy Test, Urine: NEGATIVE m[IU]/mL

## 2013-05-22 ENCOUNTER — Emergency Department: Payer: Self-pay | Admitting: Emergency Medicine

## 2013-05-22 LAB — CBC
HCT: 40 % (ref 35.0–47.0)
HGB: 13.3 g/dL (ref 12.0–16.0)
MCH: 35.4 pg — ABNORMAL HIGH (ref 26.0–34.0)
MCHC: 33.2 g/dL (ref 32.0–36.0)
Platelet: 252 10*3/uL (ref 150–440)
WBC: 13.4 10*3/uL — ABNORMAL HIGH (ref 3.6–11.0)

## 2013-05-22 LAB — URINALYSIS, COMPLETE
Glucose,UR: NEGATIVE mg/dL (ref 0–75)
Leukocyte Esterase: NEGATIVE
Nitrite: NEGATIVE
Ph: 8 (ref 4.5–8.0)
Protein: 30
Specific Gravity: 1.021 (ref 1.003–1.030)
Squamous Epithelial: NONE SEEN

## 2013-05-22 LAB — COMPREHENSIVE METABOLIC PANEL
Albumin: 3.7 g/dL (ref 3.4–5.0)
Alkaline Phosphatase: 67 U/L (ref 50–136)
BUN: 12 mg/dL (ref 7–18)
Bilirubin,Total: 0.4 mg/dL (ref 0.2–1.0)
Chloride: 109 mmol/L — ABNORMAL HIGH (ref 98–107)
EGFR (Non-African Amer.): >60
Glucose: 54 mg/dL — ABNORMAL LOW (ref 65–99)
Osmolality: 280 (ref 275–301)
Potassium: 3.4 mmol/L — ABNORMAL LOW (ref 3.5–5.1)
SGOT(AST): 42 U/L — ABNORMAL HIGH (ref 15–37)
SGPT (ALT): 31 U/L (ref 12–78)
Total Protein: 8.6 g/dL — ABNORMAL HIGH (ref 6.4–8.2)

## 2013-05-22 LAB — LIPASE, BLOOD: Lipase: 112 U/L (ref 73–393)

## 2013-05-22 LAB — DRUG SCREEN, URINE
Barbiturates, Ur Screen: NEGATIVE (ref ?–200)
Benzodiazepine, Ur Scrn: NEGATIVE (ref ?–200)
Cocaine Metabolite,Ur ~~LOC~~: NEGATIVE (ref ?–300)
MDMA (Ecstasy)Ur Screen: NEGATIVE (ref ?–500)
Opiate, Ur Screen: NEGATIVE (ref ?–300)

## 2013-06-01 LAB — CBC
HCT: 39.3 % (ref 35.0–47.0)
MCH: 37.1 pg — ABNORMAL HIGH (ref 26.0–34.0)
MCHC: 35.2 g/dL (ref 32.0–36.0)
MCV: 105 fL — ABNORMAL HIGH (ref 80–100)
Platelet: 258 10*3/uL (ref 150–440)
RBC: 3.74 10*6/uL — ABNORMAL LOW (ref 3.80–5.20)

## 2013-06-01 LAB — COMPREHENSIVE METABOLIC PANEL
Anion Gap: 15 (ref 7–16)
BUN: 6 mg/dL — ABNORMAL LOW (ref 7–18)
Calcium, Total: 9.3 mg/dL (ref 8.5–10.1)
Chloride: 102 mmol/L (ref 98–107)
Co2: 22 mmol/L (ref 21–32)
EGFR (African American): >60
EGFR (Non-African Amer.): >60
Glucose: 76 mg/dL (ref 65–99)
Osmolality: 274 (ref 275–301)
SGOT(AST): 86 U/L — ABNORMAL HIGH (ref 15–37)
SGPT (ALT): 48 U/L (ref 12–78)

## 2013-06-01 LAB — LIPASE, BLOOD: Lipase: 85 U/L (ref 73–393)

## 2013-06-01 LAB — ETHANOL
Ethanol %: 0.179 % — ABNORMAL HIGH (ref 0.000–0.080)
Ethanol: 179 mg/dL

## 2013-06-02 ENCOUNTER — Inpatient Hospital Stay: Payer: Self-pay | Admitting: Psychiatry

## 2013-06-02 LAB — URINALYSIS, COMPLETE
Ph: 6 (ref 4.5–8.0)
RBC,UR: 2 /HPF (ref 0–5)
WBC UR: 2 /HPF (ref 0–5)

## 2013-06-02 LAB — DRUG SCREEN, URINE
Cocaine Metabolite,Ur ~~LOC~~: NEGATIVE (ref ?–300)
MDMA (Ecstasy)Ur Screen: NEGATIVE (ref ?–500)
Methadone, Ur Screen: NEGATIVE (ref ?–300)
Phencyclidine (PCP) Ur S: NEGATIVE (ref ?–25)

## 2013-06-04 LAB — COMPREHENSIVE METABOLIC PANEL
Albumin: 3.7 g/dL (ref 3.4–5.0)
Bilirubin,Total: 1.1 mg/dL — ABNORMAL HIGH (ref 0.2–1.0)
Calcium, Total: 9.7 mg/dL (ref 8.5–10.1)
Chloride: 104 mmol/L (ref 98–107)
Creatinine: 0.47 mg/dL — ABNORMAL LOW (ref 0.60–1.30)
EGFR (African American): >60
EGFR (Non-African Amer.): >60
Glucose: 88 mg/dL (ref 65–99)
Potassium: 3.4 mmol/L — ABNORMAL LOW (ref 3.5–5.1)
SGOT(AST): 70 U/L — ABNORMAL HIGH (ref 15–37)
SGPT (ALT): 40 U/L (ref 12–78)
Total Protein: 8 g/dL (ref 6.4–8.2)

## 2013-06-04 LAB — AMMONIA: Ammonia, Plasma: <25 mcmol/L (ref 11–32)

## 2013-11-20 ENCOUNTER — Inpatient Hospital Stay: Payer: Self-pay | Admitting: Student

## 2013-11-20 LAB — URINALYSIS, COMPLETE
BLOOD: NEGATIVE
Bacteria: NONE SEEN
Bilirubin,UR: NEGATIVE
Glucose,UR: >=500 mg/dL (ref 0–75)
Leukocyte Esterase: NEGATIVE
NITRITE: NEGATIVE
PROTEIN: NEGATIVE
Ph: 6 (ref 4.5–8.0)
RBC,UR: NONE SEEN /HPF (ref 0–5)
SPECIFIC GRAVITY: 1.002 (ref 1.003–1.030)
Squamous Epithelial: <1
WBC UR: <1 /HPF (ref 0–5)

## 2013-11-20 LAB — COMPREHENSIVE METABOLIC PANEL
ALK PHOS: 70 U/L
AST: 24 U/L (ref 15–37)
Albumin: 4.2 g/dL (ref 3.4–5.0)
Anion Gap: 13 (ref 7–16)
BILIRUBIN TOTAL: 0.3 mg/dL (ref 0.2–1.0)
BUN: 8 mg/dL (ref 7–18)
CALCIUM: 9 mg/dL (ref 8.5–10.1)
Chloride: 110 mmol/L — ABNORMAL HIGH (ref 98–107)
Co2: 17 mmol/L — ABNORMAL LOW (ref 21–32)
Creatinine: 0.54 mg/dL — ABNORMAL LOW (ref 0.60–1.30)
EGFR (African American): >60
GLUCOSE: 47 mg/dL — AB (ref 65–99)
Osmolality: 275 (ref 275–301)
Potassium: 3 mmol/L — ABNORMAL LOW (ref 3.5–5.1)
SGPT (ALT): 18 U/L (ref 12–78)
SODIUM: 140 mmol/L (ref 136–145)
Total Protein: 9 g/dL — ABNORMAL HIGH (ref 6.4–8.2)

## 2013-11-20 LAB — CBC
HCT: 42.5 % (ref 35.0–47.0)
HGB: 14.2 g/dL (ref 12.0–16.0)
MCH: 32.1 pg (ref 26.0–34.0)
MCHC: 33.4 g/dL (ref 32.0–36.0)
MCV: 96 fL (ref 80–100)
PLATELETS: 320 10*3/uL (ref 150–440)
RBC: 4.43 10*6/uL (ref 3.80–5.20)
RDW: 14.2 % (ref 11.5–14.5)
WBC: 27.4 10*3/uL — ABNORMAL HIGH (ref 3.6–11.0)

## 2013-11-20 LAB — DRUG SCREEN, URINE
Amphetamines, Ur Screen: NEGATIVE (ref ?–1000)
BARBITURATES, UR SCREEN: NEGATIVE (ref ?–200)
Benzodiazepine, Ur Scrn: NEGATIVE (ref ?–200)
COCAINE METABOLITE, UR ~~LOC~~: NEGATIVE (ref ?–300)
Cannabinoid 50 Ng, Ur ~~LOC~~: POSITIVE (ref ?–50)
MDMA (Ecstasy)Ur Screen: NEGATIVE (ref ?–500)
Methadone, Ur Screen: NEGATIVE (ref ?–300)
OPIATE, UR SCREEN: POSITIVE (ref ?–300)
Phencyclidine (PCP) Ur S: NEGATIVE (ref ?–25)
Tricyclic, Ur Screen: NEGATIVE (ref ?–1000)

## 2013-11-20 LAB — T4, FREE: Free Thyroxine: 1.42 ng/dL (ref 0.76–1.46)

## 2013-11-20 LAB — ETHANOL
Ethanol %: 0.367 % (ref 0.000–0.080)
Ethanol: 367 mg/dL

## 2013-11-20 LAB — AMMONIA: Ammonia, Plasma: 16 mcmol/L (ref 11–32)

## 2013-11-20 LAB — ACETAMINOPHEN LEVEL: Acetaminophen: <2 ug/mL

## 2013-11-20 LAB — SALICYLATE LEVEL: SALICYLATES, SERUM: 7.9 mg/dL — AB

## 2013-11-20 LAB — TSH: THYROID STIMULATING HORM: 0.38 u[IU]/mL — AB

## 2013-11-21 LAB — VANCOMYCIN, TROUGH: VANCOMYCIN, TROUGH: 9 ug/mL — AB (ref 10–20)

## 2013-11-22 LAB — COMPREHENSIVE METABOLIC PANEL
ALK PHOS: 71 U/L
AST: 18 U/L (ref 15–37)
Albumin: 3.4 g/dL (ref 3.4–5.0)
Anion Gap: 9 (ref 7–16)
BUN: 4 mg/dL — AB (ref 7–18)
Bilirubin,Total: 1.2 mg/dL — ABNORMAL HIGH (ref 0.2–1.0)
CHLORIDE: 107 mmol/L (ref 98–107)
CO2: 18 mmol/L — AB (ref 21–32)
Calcium, Total: 8.9 mg/dL (ref 8.5–10.1)
Creatinine: 0.5 mg/dL — ABNORMAL LOW (ref 0.60–1.30)
EGFR (Non-African Amer.): >60
Glucose: 167 mg/dL — ABNORMAL HIGH (ref 65–99)
OSMOLALITY: 269 (ref 275–301)
POTASSIUM: 3.3 mmol/L — AB (ref 3.5–5.1)
SGPT (ALT): 13 U/L (ref 12–78)
SODIUM: 134 mmol/L — AB (ref 136–145)
Total Protein: 7.9 g/dL (ref 6.4–8.2)

## 2013-11-22 LAB — CBC WITH DIFFERENTIAL/PLATELET
BASOS ABS: 0.2 10*3/uL — AB (ref 0.0–0.1)
Basophil %: 1.4 %
Eosinophil #: 0 10*3/uL (ref 0.0–0.7)
Eosinophil %: 0.3 %
HCT: 36.8 % (ref 35.0–47.0)
HGB: 12.2 g/dL (ref 12.0–16.0)
Lymphocyte #: 1.7 10*3/uL (ref 1.0–3.6)
Lymphocyte %: 12.9 %
MCH: 32 pg (ref 26.0–34.0)
MCHC: 33.2 g/dL (ref 32.0–36.0)
MCV: 96 fL (ref 80–100)
Monocyte #: 0.8 x10 3/mm (ref 0.2–0.9)
Monocyte %: 6.1 %
NEUTROS PCT: 79.3 %
Neutrophil #: 10.4 10*3/uL — ABNORMAL HIGH (ref 1.4–6.5)
PLATELETS: 204 10*3/uL (ref 150–440)
RBC: 3.81 10*6/uL (ref 3.80–5.20)
RDW: 14.1 % (ref 11.5–14.5)
WBC: 13.1 10*3/uL — ABNORMAL HIGH (ref 3.6–11.0)

## 2013-11-22 LAB — LIPID PANEL
Cholesterol: 131 mg/dL (ref 0–200)
HDL Cholesterol: 35 mg/dL — ABNORMAL LOW (ref 40–60)
LDL CHOLESTEROL, CALC: 77 mg/dL (ref 0–100)
Triglycerides: 97 mg/dL (ref 0–200)
VLDL Cholesterol, Calc: 19 mg/dL (ref 5–40)

## 2013-11-22 LAB — MAGNESIUM: MAGNESIUM: 1.5 mg/dL — AB

## 2013-11-22 LAB — HEMOGLOBIN A1C: Hemoglobin A1C: 5.1 % (ref 4.2–6.3)

## 2013-11-22 LAB — URINE CULTURE

## 2013-11-23 LAB — BASIC METABOLIC PANEL
Anion Gap: 5 — ABNORMAL LOW (ref 7–16)
BUN: 2 mg/dL — AB (ref 7–18)
CO2: 22 mmol/L (ref 21–32)
CREATININE: 0.48 mg/dL — AB (ref 0.60–1.30)
Calcium, Total: 8.7 mg/dL (ref 8.5–10.1)
Chloride: 110 mmol/L — ABNORMAL HIGH (ref 98–107)
EGFR (African American): >60
GLUCOSE: 137 mg/dL — AB (ref 65–99)
OSMOLALITY: 272 (ref 275–301)
POTASSIUM: 3.2 mmol/L — AB (ref 3.5–5.1)
Sodium: 137 mmol/L (ref 136–145)

## 2013-11-23 LAB — VANCOMYCIN, TROUGH: Vancomycin, Trough: 10 ug/mL (ref 10–20)

## 2013-11-25 LAB — CULTURE, BLOOD (SINGLE)

## 2014-07-16 ENCOUNTER — Emergency Department: Payer: Self-pay | Admitting: Emergency Medicine

## 2014-07-16 LAB — DRUG SCREEN, URINE
Amphetamines, Ur Screen: NEGATIVE (ref ?–1000)
Barbiturates, Ur Screen: NEGATIVE (ref ?–200)
Benzodiazepine, Ur Scrn: NEGATIVE (ref ?–200)
COCAINE METABOLITE, UR ~~LOC~~: NEGATIVE (ref ?–300)
Cannabinoid 50 Ng, Ur ~~LOC~~: POSITIVE (ref ?–50)
MDMA (ECSTASY) UR SCREEN: NEGATIVE (ref ?–500)
METHADONE, UR SCREEN: NEGATIVE (ref ?–300)
Opiate, Ur Screen: POSITIVE (ref ?–300)
PHENCYCLIDINE (PCP) UR S: NEGATIVE (ref ?–25)
Tricyclic, Ur Screen: POSITIVE (ref ?–1000)

## 2014-07-16 LAB — CBC WITH DIFFERENTIAL/PLATELET
Basophil #: 0.1 10*3/uL (ref 0.0–0.1)
Basophil %: 1.4 %
Eosinophil #: 0 10*3/uL (ref 0.0–0.7)
Eosinophil %: 0.1 %
HCT: 35.8 % (ref 35.0–47.0)
HGB: 11.7 g/dL — AB (ref 12.0–16.0)
LYMPHS ABS: 1.2 10*3/uL (ref 1.0–3.6)
LYMPHS PCT: 17.6 %
MCH: 30.5 pg (ref 26.0–34.0)
MCHC: 32.7 g/dL (ref 32.0–36.0)
MCV: 93 fL (ref 80–100)
MONOS PCT: 2.6 %
Monocyte #: 0.2 x10 3/mm (ref 0.2–0.9)
NEUTROS ABS: 5.3 10*3/uL (ref 1.4–6.5)
NEUTROS PCT: 78.3 %
Platelet: 387 10*3/uL (ref 150–440)
RBC: 3.84 10*6/uL (ref 3.80–5.20)
RDW: 14.4 % (ref 11.5–14.5)
WBC: 6.8 10*3/uL (ref 3.6–11.0)

## 2014-07-16 LAB — URINALYSIS, COMPLETE
Bacteria: NONE SEEN
Bilirubin,UR: NEGATIVE
Blood: NEGATIVE
Ketone: NEGATIVE
Leukocyte Esterase: NEGATIVE
Nitrite: NEGATIVE
PH: 7 (ref 4.5–8.0)
PROTEIN: NEGATIVE
Specific Gravity: 1.015 (ref 1.003–1.030)
Squamous Epithelial: 3

## 2014-07-16 LAB — COMPREHENSIVE METABOLIC PANEL
ALBUMIN: 3.4 g/dL (ref 3.4–5.0)
ALK PHOS: 57 U/L
ANION GAP: 15 (ref 7–16)
AST: 27 U/L (ref 15–37)
BILIRUBIN TOTAL: 0.5 mg/dL (ref 0.2–1.0)
BUN: 5 mg/dL — AB (ref 7–18)
CO2: 18 mmol/L — AB (ref 21–32)
Calcium, Total: 8.9 mg/dL (ref 8.5–10.1)
Chloride: 112 mmol/L — ABNORMAL HIGH (ref 98–107)
Creatinine: 0.67 mg/dL (ref 0.60–1.30)
EGFR (African American): >60
EGFR (Non-African Amer.): >60
Glucose: 99 mg/dL (ref 65–99)
Osmolality: 286 (ref 275–301)
Potassium: 3.6 mmol/L (ref 3.5–5.1)
SGPT (ALT): 24 U/L
Sodium: 145 mmol/L (ref 136–145)
Total Protein: 8.7 g/dL — ABNORMAL HIGH (ref 6.4–8.2)

## 2014-07-16 LAB — TROPONIN I: Troponin-I: <0.02 ng/mL

## 2014-07-16 LAB — ETHANOL: Ethanol: 221 mg/dL

## 2014-07-16 LAB — HCG, QUANTITATIVE, PREGNANCY: Beta Hcg, Quant.: <1 m[IU]/mL — ABNORMAL LOW

## 2014-07-16 LAB — LIPASE, BLOOD: Lipase: 67 U/L — ABNORMAL LOW (ref 73–393)

## 2014-07-17 ENCOUNTER — Emergency Department: Payer: Self-pay | Admitting: Emergency Medicine

## 2014-07-18 ENCOUNTER — Emergency Department: Payer: Self-pay | Admitting: Emergency Medicine

## 2014-07-27 ENCOUNTER — Emergency Department: Payer: Self-pay | Admitting: Emergency Medicine

## 2014-07-29 ENCOUNTER — Emergency Department: Payer: Self-pay | Admitting: Emergency Medicine

## 2014-09-19 ENCOUNTER — Emergency Department: Payer: Self-pay | Admitting: Emergency Medicine

## 2015-02-16 NOTE — Consult Note (Signed)
General Aspect need for emergent dialysis secondary to acidosis    Present Illness The patient is a 39 year old female with history of hypertension, history of significant alcohol abuse and withdrawals in the past, depression, and hepatosteatosis who presented to the hospital for alcohol poisoning. She recently started drinking about a week ago drinking a bottle of vodka. Two days ago she started to experience nausea and vomiting and abdominal pain which is more in the epigastric and lower abdomen area. The vomiting initially was per her regular, however, she then started to have hematemesis about six to seven times.  She continues to have hematemesis with severe abdominal pain.  In hte ER she was noted to have severe acidosis with serum CO2 of 9 and pH of 7.12 and the patient was also showing alcohol in the system with level of 0.28%. She stated she last drank alcohol last night. The decision has been made to initiate dialysis secondary to her acidosis.  PAST MEDICAL HISTORY:  1. Alcohol abuse. 2. Hypertension likely in the setting of withdrawals in the past. 3. Depression. 4. Hepatosteatosis.  5. Renal cyst. 6. History of anemia and thrombocytopenia. 7. History of severe acute anion gap metabolic acidosis in the past, required one time dialysis in the middle of April 2013. 8. History of hypokalemia and hypomagnesemia.   Home Medications: Medication Instructions Status  citalopram 40 mg oral tablet 1 tab(s) orally once a day Active  lithium 300 mg oral capsule 1 cap(s) orally once a day Active  trazodone 100 mg oral tablet 1 tab(s) orally once a day (at bedtime). Active    No Known Allergies:   Case History:   Family History Non-Contributory    Social History negative tobacco, positive ETOH, negative Illicit drugs   Review of Systems:   ROS Pt not able to provide ROS  confused with inappropriate responces   Physical Exam:   GEN well developed, well nourished, disheveled     HEENT pale conjunctivae, PERRL, hearing intact to voice    NECK supple  trachea midline    RESP normal resp effort  no use of accessory muscles    CARD regular rate  no JVD    ABD positive tenderness  soft    EXTR negative cyanosis/clubbing, negative edema    SKIN No rashes, No ulcers, skin turgor poor    PSYCH poor insight, lethargic   Nursing/Ancillary Notes: **Vital Signs.:   31-Aug-13 13:46   Vital Signs Type Admission   Temperature Temperature (F) 98.5   Celsius 36.9   Pulse Pulse 98   Respirations Respirations 56   Systolic BP Systolic BP 132   Diastolic BP (mmHg) Diastolic BP (mmHg) 440   Mean BP 131   Pulse Ox % Pulse Ox % 99   Pulse Ox Heart Rate 98   Thyroid:  31-Aug-13 05:17    Thyroid Stimulating Hormone  0.28 (0.45-4.50 (International Unit)  ----------------------- Pregnant patients have  different reference  ranges for TSH:  - - - - - - - - - -  Pregnant, first trimetser:  0.36 - 2.50 uIU/mL)    14:01    Thyroxine, Free  0.71 (Result(s) reported on 29 Jun 2012 at 03:06PM.)  Hepatic:  31-Aug-13 05:17    Bilirubin, Total 0.5   Alkaline Phosphatase 77   SGPT (ALT) 25   SGOT (AST)  46   Total Protein, Serum  9.7   Albumin, Serum 3.9  TDMs:  31-Aug-13 14:01    Lithium, Serum  <  0.20 (0.60-1.20 TOXIC >= 1.50 mmol/L)  Routine BB:  31-Aug-13 08:52    ABO Group + Rh Type O Positive   Antibody Screen NEGATIVE (Result(s) reported on 29 Jun 2012 at 10:00AM.)  Routine Micro:  31-Aug-13 10:49    Micro Text Report WET PREP   COMMENT                   NO TRICHOMONAS,SPERMATOZOA,YEAST,OR CLUE CELLS SEEN   COMMENT                   NO WHITE BLOOD CELLS SEEN   ANTIBIOTIC                        Comment 1. NO TRICHOMONAS,SPERMATOZOA,YEAST,OR CLUE CELLS SEEN   Comment 2. NO WHITE BLOOD CELLS SEEN  Result(s) reported on 29 Jun 2012 at 10:55AM.  Lab:  31-Aug-13 07:15    Lactic Acid, Cardiopulmonary  4.80 (Result(s) reported on 29 Jun 2012 at 10:47AM.)    pH (ABG)  7.12   PCO2  25   PO2 94   FiO2 21   Base Excess  -19.7   HCO3  8.1   O2 Saturation 94.0   Specimen Site (ABG) RT BRACHIAL   Specimen Type (ABG) ARTERIAL   Patient Temp (ABG) 37.0  General Ref:  31-Aug-13 14:01    Acetaminophen, Serum < 2.0 (10-30 POTENTIALLY TOXIC:  > 200 mcg/mL  > 50 mcg/mL at 12 hr after  ingestion  > 300 mcg/mL at 4 hr after  ingestion)   Salicylates, Serum 2.4 (0.0-2.8 Therapeutic 2.8-20.0 mg/dL Toxic >30.0 mg/dL)  Routine Chem:  31-Aug-13 05:17    Result Comment CO2 - RESULTS VERIFIED BY REPEAT TESTING.  - NOTIFIED OF CRITICAL VALUE  - C/ HANNA MEYER @0621  06-29-12  - READ-BACK PROCESS PERFORMED.  Result(s) reported on 29 Jun 2012 at 06:16AM.   Lipase  51 (Result(s) reported on 29 Jun 2012 at 07:44AM.)   Glucose, Serum  149   BUN  5   Creatinine (comp)  0.59   Sodium, Serum  131   Potassium, Serum 4.0   Chloride, Serum 99   CO2, Serum  9   Calcium (Total), Serum 9.0   Osmolality (calc) 263   eGFR (African American) >60   eGFR (Non-African American) >60 (eGFR values <43m/min/1.73 m2 may be an indication of chronic kidney disease (CKD). Calculated eGFR is useful in patients with stable renal function. The eGFR calculation will not be reliable in acutely ill patients when serum creatinine is changing rapidly. It is not useful in  patients on dialysis. The eGFR calculation may not be applicable to patients at the low and high extremes of body sizes, pregnant women, and vegetarians.)   Anion Gap  23   Ethanol, S. 280   Ethanol % (comp)  0.280 (Result(s) reported on 29 Jun 2012 at 06:16AM.)    07:15    Result Comment - NOTIFIED OF CRITICAL VALUE  - READ-BACK PROCESS PERFORMED.  - dr mCinda Questat [phone] 06/29/2012  Result(s) reported on 29 Jun 2012 at 07:24AM.    08:14    Result Comment OPIATE/CANNABINOIDS - RESULTS VERIFIED BY REPEAT TESTING.  Result(s) reported on 29 Jun 2012 at 08:52AM.    08:20    Result Comment OSMOLALITY BLOOD  - RESULTS VERIFIED BY REPEAT TESTING.  - NOTIFIED OF CRITICAL VALUE  - C/CHRIS VODILA @ 1001 ON 06/29/2012 [facility]  - READ-BACK PROCESS PERFORMED.  Result(s) reported on 29 Jun 2012 at 09:59AM.   Osmolality, Blood  342  Urine Drugs:  [id number]-YNW-[id number] 56:21    Tricyclic Antidepressant, Ur Qual (comp) NEGATIVE (Result(s) reported on 29 Jun 2012 at 08:52AM.)   Amphetamines, Urine Qual. NEGATIVE   MDMA, Urine Qual. NEGATIVE   Cocaine Metabolite, Urine Qual. NEGATIVE   Opiate, Urine qual POSITIVE   Phencyclidine, Urine Qual. NEGATIVE   Cannabinoid, Urine Qual. POSITIVE   Barbiturates, Urine Qual. NEGATIVE   Benzodiazepine, Urine Qual. NEGATIVE (----------------- The URINE DRUG SCREEN provides only a preliminary, unconfirmed analytical test result and should not be used for non-medical  purposes.  Clinical consideration and professional judgment should be  applied to any positive drug screen result due to possible interfering substances.  A more specific alternate chemical method must be used in order to obtain a confirmed analytical result.  Gas chromatography/mass spectrometry (GC/MS) is the preferred confirmatory method.)   Methadone, Urine Qual. NEGATIVE  Routine UA:  31-Aug-13 08:14    Color (UA) Yellow   Clarity (UA) Hazy   Glucose (UA) Negative   Bilirubin (UA) Negative   Ketones (UA) 2+   Specific Gravity (UA) 1.013   Blood (UA) 2+   pH (UA) 5.0   Protein (UA) 100 mg/dL   Nitrite (UA) Negative   Leukocyte Esterase (UA) Negative (Result(s) reported on 29 Jun 2012 at 08:52AM.)   RBC (UA) 2 /HPF   WBC (UA) 1 /HPF   Bacteria (UA) TRACE   Epithelial Cells (UA) NONE SEEN   Mucous (UA) PRESENT   Amorphous Crystal (UA) PRESENT (Result(s) reported on 29 Jun 2012 at 08:52AM.)  Routine Sero:  31-Aug-13 08:14    Pregnancy Test, Urine NEGATIVE (The results of the qualitative urine HCG (Pregnancy Test) should be evaluated in light of other clinical information.  There are limitations to the  test which, in certain clinical situations, may result in a false positive or negative result. Thehigh dose hook effect can occur in urine samples with extremely high HCG concentrations.  This effect can produce a negative result in certain situations. It is suggested that results of the qualitative HCG be confirmed by an alternate methodology, such as the quantitative serum beta HCG test.)  Routine Hem:  31-Aug-13 05:17    WBC (CBC)  23.5   RBC (CBC) 4.22   Hemoglobin (CBC) 14.7   Hematocrit (CBC) 43.2   Platelet Count (CBC) 352 (Result(s) reported on 29 Jun 2012 at 05:45AM.)   MCV  102   MCH  34.8   MCHC 34.0   RDW 14.3     Impression 1.  Acidosis         will place right IJ temp cath with ultrasound 2.  Alcohol toxicity         will need to cover for withdrawl 3.  GI bleed          IV ppi    Plan level 3 consult   Electronic Signatures: Hortencia Pilar (MD)  (Signed 31-Aug-13 17:04)  Authored: General Aspect/Present Illness, Home Medications, Allergies, History and Physical Exam, Vital Signs, Labs, Impression/Plan   Last Updated: 31-Aug-13 17:04 by Hortencia Pilar (MD)

## 2015-02-16 NOTE — Discharge Summary (Signed)
PATIENT NAME:  Sarah Serrano, Sarah Serrano MR#:  [id number] DATE OF BIRTH:  August 19, 1976  DATE OF ADMISSION:  10/09/2012 DATE OF DISCHARGE:  10/10/2012  Patient left AGAINST MEDICAL ADVICE on 10/10/2012.    REASON FOR ADMISSION: Intractable nausea and vomiting with abdominal pain.   DIAGNOSES AT DISCHARGE:  1. Alcohol withdrawal.  2. Abdominal pain, likely due to gastritis.  3. Patient was admitted mostly with vomiting.  4. She has history of severe erosive esophagitis and gastritis per recent esophagogastroduodenoscopy done on 07/02/2012. We consulted gastroenterology to address this issue. The patient was taken off non-steroidal anti-inflammatory medications, she was given intravenous Protonix and Carafate and she was counseled for alcohol problems.   NOTE: I did have a long discussion with her. I spent probably over an hour dealing with her issues but she states that since we were not going to give her high dose of narcotics and IV doses of benzodiazepines that she was going to leave. She compromised at some point to get her CT scan done and if it was normal probably go the next day. I had a long discussion about why we were not going to give her Toradol, why we were not going to give her heavy doses of narcotics. Patient still felt adamant about the decisions. I confronted her about the fact that she had no prescription for opiates and she was opiate positive. She had urine positive for marijuana and urine positive for MDMA which is ecstasy. Patient says that she does not do drugs and that probably somebody put all those things on one of her cigarettes. The patient had her CT scan. Her CT scan did not show any significant pathology. Patient had an H. pylori antigen check and that is pending. Needs to follow up with GI. At this moment the patient just left the hospital without any prescriptions and without waiting for any test results or anything. She had a very bad behavior during this hospitalization where  she was pretty much saying that her pain was 10/10, but she was walking around the nursing station, she was being a little bit adamant about her care and she was very high strung against the nurses. The patient also had very concerning behavior calling defunct friends and boyfriend to bring her pills, to bring her medications and acting suspicious.   Her potassium was 2.6 and her magnesium was 0.6 which were replaced IV prior to patient leaving.   ____________________________ [place] Sink, MD rsg:cms D: 10/11/2012 07:22:33 ET T: 10/11/2012 12:45:03 ET JOB#: [id number]  cc: [place] Sink, MD, <Dictator> Murel Wigle America Brown MD ELECTRONICALLY SIGNED 10/13/2012 14:12

## 2015-02-16 NOTE — Consult Note (Signed)
Chief Complaint:   Subjective/Chief Complaint continues with abdominal pain and nausea.  no recurrent emesis. c/o retrosternal burning. no bm. tremulous   VITAL SIGNS/ANCILLARY NOTES: **Vital Signs.:   02-Sep-13 06:00   Vital Signs Type Routine   Temperature Temperature (F) 98.9   Celsius 37.1   Temperature Source Oral   Pulse Pulse 106   Respirations Respirations 18   Systolic BP Systolic BP 166   Diastolic BP (mmHg) Diastolic BP (mmHg) 76   Mean BP 88   Pulse Ox % Pulse Ox % 96   Pulse Ox Activity Level  At rest   Oxygen Delivery Room Air/ 21 %   Brief Assessment:   Cardiac Regular    Respiratory clear BS    Gastrointestinal details normal Soft  No rebound tenderness  mild diffuse tenderness, positive bowel sounds.   Lab Results:  Routine Chem:  02-Sep-13 05:36    Glucose, Serum  116   BUN  5   Creatinine (comp) 0.62   Sodium, Serum 137   Potassium, Serum  3.0   Chloride, Serum 98   CO2, Serum 32   Calcium (Total), Serum 8.6   Anion Gap 7   Osmolality (calc) 272   eGFR (African American) >60   eGFR (Non-African American) >60 (eGFR values <88m/min/1.73 m2 may be an indication of chronic kidney disease (CKD). Calculated eGFR is useful in patients with stable renal function. The eGFR calculation will not be reliable in acutely ill patients when serum creatinine is changing rapidly. It is not useful in  patients on dialysis. The eGFR calculation may not be applicable to patients at the low and high extremes of body sizes, pregnant women, and vegetarians.)   Result Comment POTASSIUM - Slight hemolysis, interpret results with  - caution.  Result(s) reported on 01 Jul 2012 at 06:36AM.  Routine Coag:  02-Sep-13 05:36    Prothrombin 12.8   INR 0.9 (INR reference interval applies to patients on anticoagulant therapy. A single INR therapeutic range for coumarins is not optimal for all indications; however, the suggested range for most indications is 2.0 -  3.0. Exceptions to the INR Reference Range may include: Prosthetic heart valves, acute myocardial infarction, prevention of myocardial infarction, and combinations of aspirin and anticoagulant. The need for a higher or lower target INR must be assessed individually. Reference: The Pharmacology and Management of the Vitamin K  antagonists: the seventh ACCP Conference on Antithrombotic and Thrombolytic Therapy. CAYTKZ.6010Sept:126 (3suppl): [id number] A HCT value >55% may artifactually increase the PT.  In one study,  the increase was an average of 25%. Reference:  "Effect on Routine and Special Coagulation Testing Values of Citrate Anticoagulant Adjustment in Patients with High HCT Values." American Journal of Clinical Pathology 2006;126:400-405.)  Routine Hem:  02-Sep-13 05:36    WBC (CBC) 9.3   RBC (CBC)  3.52   Hemoglobin (CBC) 12.4   Hematocrit (CBC) 35.4   Platelet Count (CBC) 161   MCV  101   MCH  35.4   MCHC 35.1   RDW  14.7   Neutrophil % 75.4   Lymphocyte % 15.9   Monocyte % 7.9   Eosinophil % 0.1   Basophil % 0.7   Neutrophil #  7.0   Lymphocyte # 1.5   Monocyte # 0.7   Eosinophil # 0.0   Basophil # 0.1 (Result(s) reported on 01 Jul 2012 at 06:34AM.)   Assessment/Plan:  Assessment/Plan:   Assessment 1) hematemesis in the setting of etoh abuse, likely etoh  related gastropathy.  no recurrent emesis, no melena.  2) polysubstance abuse    Plan 1) patietn threatening to go AMA, declining EGD.  recommend psych involvement if possible.   Electronic Signatures for Addendum Section:  Loistine Simas (MD) (Signed Addendum 02-Sep-13 08:57)  patient has changed her mind and wishes to do EGD.   Electronic Signatures: Loistine Simas (MD)  (Signed 02-Sep-13 08:10)  Authored: Chief Complaint, VITAL SIGNS/ANCILLARY NOTES, Brief Assessment, Lab Results, Assessment/Plan   Last Updated: 02-Sep-13 08:57 by Loistine Simas (MD)

## 2015-02-16 NOTE — Consult Note (Signed)
Brief Consult Note: Diagnosis: Alcohol dependence.   Patient was seen by consultant.   Consult note dictated.   Recommend further assessment or treatment.   Orders entered.   Discussed with Attending MD.   Comments: Ms. Pepitone is an alcoholic. She relapsed 2 weeks ago. Here for abdominal pain and GI bleed, kidney failure. The patient initially threatened to leave AMA and was IVCed. She initially refused EGD but eventually agreed to it.  MSE: Alert, oriented, pleasant, polite apologetic. She explains that "she was confused and hurting" when she refuswed treatment and was difficult. She wants to get better and is ready to accept treatment. She understands that she needs to change her lifestyle. She is ready to start therapy for substance abuse treatment.   PLAN: 1. Please continue CIWA protocol.   2. Will continue Celexa and Trazodone for depression and insomnia. She has been off Lithium lately. Will hold it.   3. We will make appointment with [facility] for medication managmenet and substance abuyse.   4. I will follow up.  Electronic Signatures: Orson Slick (MD)  (Signed 03-Sep-13 18:34)  Authored: Brief Consult Note   Last Updated: 03-Sep-13 18:34 by Orson Slick (MD)

## 2015-02-16 NOTE — Consult Note (Signed)
PATIENT NAME:  Sarah, Serrano MR#:  [id number] DATE OF BIRTH:  [birth year]  DATE OF CONSULTATION:  06/29/2012  REFERRING PHYSICIAN:  Dr. Bridgette Habermann CONSULTING PHYSICIAN:  Lollie Sails, MD  REASON FOR CONSULTATION: Hematemesis, abdominal pain.   HISTORY OF PRESENT ILLNESS: [name] is a 39 year old African American female who presented to the Emergency Room this morning with abdominal pain and emesis. She has had several episodes of a coffee-ground-colored emesis. She states that she has been throwing up and having abdominal pain for the past several days. However, she denies seeing any black stools, except perhaps some last week. She has a long term history of alcohol abuse, began drinking very heavily in her late 47s. She stated that she was in the hospital this past April at [facility] and had at that time decided to quit drinking. However, over the past week she has been drinking quite heavily as well as using marijuana. Her abdominal pain is exacerbated when she throws up. She denies any previous history of peptic ulcer disease or having had an esophagogastroduodenoscopy or colonoscopy in the past. She denied to me that she had seen psychiatry in the past. However, in looking over her hospitalization notes from this past April she was seen by psychiatry on multiple occasions by multiple providers. She denies use of aspirins or NSAIDs.   PAST MEDICAL HISTORY:  1. History of severe alcohol abuse as noted above.  2. Bilateral tubal ligation.  3. On her last hospitalization there was some concern in regards to possible ethylene, glycol and/or methanol consumption. She also has a history of multiple substance abuse including heroin as well as other medications, use of cocaine and marijuana.  4. She presented in April as well with a severe acidosis with a bicarbonate at 4. She did not undergo a luminal evaluation at that time.   GASTROINTESTINAL FAMILY HISTORY:  Pertinent  for alcohol abuse, uncertain for other gastrointestinal problems.   PHYSICAL EXAMINATION:  VITAL SIGNS: Temperature 98.9, pulse 91, respirations 19, blood pressure 176/89, pulse oximetry 98%.   GENERAL: She is a 39 year old African American female in obvious discomfort. She quiets down fairly well after being given pain medications.   HEENT: Normocephalic, atraumatic.   EYES: Anicteric.   NOSE: Septum midline.   OROPHARYNX: Fair dentition.   NECK: Supple. No JVD.   HEART: Regular rate and rhythm without rub or gallop.   LUNGS: Clear.   ABDOMEN: Soft. She is mild to moderately tender throughout the epigastrium and into the right upper quadrant, somewhat toward the right lower quadrant. The opposite side of the abdomen is much less tender. Bowel sounds are positive, normoactive. There are no masses or rebound noted.   ANORECTAL: Deferred.   EXTREMITIES: No clubbing, cyanosis, or edema.   NEUROLOGICAL: Cranial nerves II through XII grossly intact. Muscle strength bilaterally equal and symmetric.   OUTPATIENT MEDICATIONS:  1. Citalopram 40 mg once a day. 2. Lithium 300 mg once a day. 3. Trazodone 100 mg once a day.   ALLERGIES: She has no known drug allergies.   LABORATORY, DIAGNOSTIC, AND RADIOLOGICAL DATA: On admission to the hospital she had a glucose 149, BUN 5, creatinine 0.59, sodium 131, potassium 4.0, chloride 99, bicarbonate 9, lipase 51. Her blood osmolality was 342. Ethanol level was 0.280. Her hepatic profile showing a total protein of 9.7, albumin 3.9, bilirubin 0.5, alkaline phosphatase 77, AST 46, ALT 25. TSH decreased at 0.28. She was positive on urine drug screen for both  cannabinoid and opiates. Her hemogram shows a white count of 23.5, hemoglobin and hematocrit of 14.7 and 43.2, platelet count 352. Her MCV is 102. She has been screened. Urinalysis shows 2+ ketones, 2+ blood, 100 mg/dL protein, negative for nitrites, negative for leukocyte esterase. An ABG shows her  to have a pH of 7.12, pCO2 of 25, pO2 94, lactic acid 4.80. She had a portable chest film which showed no evidence of acute cardiopulmonary disease. Lungs were clear. Otherwise, unremarkable film. She had a CT scan of the abdomen and pelvis with contrast showing some mild to moderate diffuse bowel wall thickening, alternatively these may represent nondistention. No evidence of significant inflammatory change in the surrounding mesenteric fat or free fluid. This also speaking toward nondistention. No evidence of obstructive or inflammatory abnormalities. The liver shows a diffuse, mild low attenuation, a focal 5 to 7 mm nodule in the right kidney likely a small cyst. Small to moderate hiatal hernia. Possible thickening of the mucosa in the hiatal hernia, probably secondary to gastroesophageal reflux disease.   ASSESSMENT:  1. The patient is presenting with coffee-ground emesis and significant alcohol level following recrudescence of alcohol abuse. She is quite acidotic with ketones, likely secondary to above as well. She has demonstrated some coffee-ground type emesis. However, no black stools at least over the course of today or the past day or two. Hemoglobin is normal, however, she may also be somewhat volume contracted.  2. Alcohol abuse/polysubstance abuse.   RECOMMENDATIONS:  1. The case was discussed with Dr. Bridgette Habermann. Recommend octreotide bolus and then drip with scheduled around the clock antiemetics as well as IV PPI.  2. Correct acidosis.  3. Esophagogastroduodenoscopy when clinically feasible.  4. Serial hemoglobins she is rehydrated.  5. Would also consider repeat evaluation by psych in regards to recrudescence substance abuse. 6. We will follow with you.  ____________________________ Lollie Sails, MD mus:ap D: 06/29/2012 12:54:49 ET T: 06/29/2012 13:23:18 ET JOB#: [id number] cc: Lollie Sails, MD, <Dictator> Lollie Sails MD ELECTRONICALLY SIGNED 07/22/2012 9:05

## 2015-02-16 NOTE — Consult Note (Signed)
Brief Consult Note: Diagnosis: alcohol dependence.   Recommend further assessment or treatment.   Orders entered.   Comments: Psychiatry: I came by to see this patient for consult today. She had been taken to have a CT scan done. I did review her chart. Patient has a history of alcohol dependence with a past history of complicated withdrawal and possible delirium at times. No alcohol level was done on admission here and the patient says that she has not had a drink in several days according to the admitting note. Nevertheless, this would suggest that she is still at risk for alcohol withdrawal and delirium tremens. I have put in the CIWA orders for now. I will followup with seeing the patient tomorrow.  Electronic Signatures: Gonzella Lex (MD)  (Signed 12-Dec-13 18:06)  Authored: Brief Consult Note   Last Updated: 12-Dec-13 18:06 by Gonzella Lex (MD)

## 2015-02-16 NOTE — Consult Note (Signed)
Chief Complaint:   Subjective/Chief Complaint continues with generalized abdominal pain.  positive n no vomiting no bm.   VITAL SIGNS/ANCILLARY NOTES: **Vital Signs.:   01-Sep-13 08:00   Vital Signs Type Routine   Temperature Temperature (F) 98.1   Celsius 36.7   Temperature Source oral   Pulse Pulse 116   Respirations Respirations 21   Systolic BP Systolic BP 601   Diastolic BP (mmHg) Diastolic BP (mmHg) 97   Mean BP 115   Pulse Ox % Pulse Ox % 96   Oxygen Delivery Room Air/ 21 %   Pulse Ox Heart Rate 116    10:00   Vital Signs Type Routine   Pulse Pulse 110   Respirations Respirations 18   Systolic BP Systolic BP 093   Diastolic BP (mmHg) Diastolic BP (mmHg) 235   Mean BP 118   Pulse Ox % Pulse Ox % 98   Oxygen Delivery Room Air/ 21 %   Pulse Ox Heart Rate 110   Brief Assessment:   Cardiac tachycardia    Respiratory clear BS    Gastrointestinal details normal Soft  Nondistended  No rebound tenderness  positive bowel sounds, moderate diffuse discomfort/tenderness to palpation   Lab Results: Routine Chem:  01-Sep-13 03:25    Result Comment LABS - This specimen was collected through an   - indwelling catheter or arterial line.  - A minimum of 102ms of blood was wasted prior    - to collecting the sample.  Interpret  - results with caution.  Result(s) reported on 30 Jun 2012 at 03:58AM.   Glucose, Serum  311   BUN  3   Creatinine (comp) 0.75   Sodium, Serum  134   Potassium, Serum  3.3   Chloride, Serum  91   CO2, Serum  35   Calcium (Total), Serum  8.2   Anion Gap 8   Osmolality (calc) 277   eGFR (African American) >60   eGFR (Non-African American) >60 (eGFR values <650mmin/1.73 m2 may be an indication of chronic kidney disease (CKD). Calculated eGFR is useful in patients with stable renal function. The eGFR calculation will not be reliable in acutely ill patients when serum creatinine is changing rapidly. It is not useful in  patients on dialysis. The  eGFR calculation may not be applicable to patients at the low and high extremes of body sizes, pregnant women, and vegetarians.)   Magnesium, Serum  1.5 (1.8-2.4 THERAPEUTIC RANGE: 4-7 mg/dL TOXIC: > 10 mg/dL  -----------------------)  Routine Hem:  01-Sep-13 03:25    WBC (CBC)  13.8   RBC (CBC)  3.40   Hemoglobin (CBC) 12.1   Hematocrit (CBC)  33.6   Platelet Count (CBC) 159   MCV 99   MCH  35.6   MCHC  36.1   RDW 14.5   Neutrophil % 84.0   Lymphocyte % 7.4   Monocyte % 8.3   Eosinophil % 0.0   Basophil % 0.3   Neutrophil #  11.6   Lymphocyte # 1.0   Monocyte #  1.1   Eosinophil # 0.0   Basophil # 0.0   Assessment/Plan:  Assessment/Plan:   Assessment 1) abdominal pain-etoh gastropathy, many episodes of emesis and wretching.  2) etoh abuse, polysubstance abuse 2) abnormal ct scan-inferring colitis.  likely underdistension.  no diarrhea. 4) coffeeground emesis-not recurrent, likely etoh related gastropathsy, less likely Mallory-Weiss tear-on octreotide and iv ppi.  continue.    Plan 1) will plan for egd tomorrow.  I  have discussed the risks benefits and complicatons of egd to include not limited to bleeding infection perforation and sedation and she wishes to proceed.   Electronic Signatures: Loistine Simas (MD)  (Signed 01-Sep-13 11:22)  Authored: Chief Complaint, VITAL SIGNS/ANCILLARY NOTES, Brief Assessment, Lab Results, Assessment/Plan   Last Updated: 01-Sep-13 11:22 by Loistine Simas (MD)

## 2015-02-16 NOTE — H&P (Signed)
PATIENT NAME:  Sarah Serrano, Sarah Serrano MR#:  [id number] DATE OF BIRTH:  [birth year]  DATE OF ADMISSION:  06/29/2012  REFERRING PHYSICIAN: Conni Slipper, MD  CHIEF COMPLAINT: Nausea, vomiting, and abdominal pain.   HISTORY OF PRESENT ILLNESS: The patient is a 39 year old African American female with history of hypertension, history of significant alcohol abuse and withdrawals in the past, depression, and hepatosteatosis who presented to the hospital for the above chief complaint. The patient stated that since her last discharge from the hospital earlier this year she stopped drinking. She typically before that was drinking at 8 to 10 vodka drinks per day. She recently started drinking about a week ago drinking a bottle of vodka. Two days ago she started to experience nausea and vomiting and abdominal pain which is more in the epigastric and lower abdomen area. The vomiting initially was per her regular, however, she then started to have hematemesis about six to seven times.  She continues to have hematemesis with severe abdominal pain which  She has no fevers but has been feeling "hot and cold". There is a dry cough. Here she was noted to have severe acidosis with serum CO2 of 9 and pH of 7.12 and the patient was also showing alcohol in the system with level of 0.28%. She stated she last drank alcohol last night. Hospitalist service was contacted for further evaluation and management.   PAST MEDICAL HISTORY:  1. Alcohol abuse. 2. Hypertension likely in the setting of withdrawals in the past. 3. Depression. 4. Hepatosteatosis.  5. Renal cyst. 6. History of anemia and thrombocytopenia. 7. History of severe acute anion gap metabolic acidosis in the past, required one time dialysis in the middle of April 2013. 8. History of hypokalemia and hypomagnesemia.   PAST SURGICAL HISTORY: Bilateral tubal ligation.  DRUG ALLERGIES: No known drug allergies.   CURRENT MEDICATIONS:  1. Celexa 40 mg  daily. 2. Trazodone 100 mg at bedtime.  3. Lithium 300 mg.   FAMILY HISTORY: Alcohol abuse, otherwise she does not know.   SOCIAL HISTORY: Positive for marijuana. No tobacco. Positive for alcohol, recently 8 to 10 drinks a day for the past week.   REVIEW OF SYSTEMS: CONSTITUTIONAL: No fever or fatigue, but has been feeling "hot and cold". No weight changes. EYES: Some blurry vision. ENT: No tinnitus. Positive for some dizziness. RESPIRATORY: Positive for dry cough. No wheezing. No hemoptysis. CARDIOVASCULAR: Denies chest pain. No edema. No swelling in the legs. GI: Positive for nausea, vomiting and abdominal pain as above. Positive for hematemesis. No dark stools that she could see, but she is unsure. No diarrhea. GU: Positive for dysuria. ENDOCRINE: Denies polyuria or nocturia. HEME/LYMPH: History of anemia in the past, also history of thrombocytopenia. SKIN: No new rashes. MUSCULOSKELETAL: Denies arthritis. NEUROLOGIC: Denies any focal weakness but has some global weakness. No history of cerebrovascular accident or transient ischemic attack. PSYCHIATRIC: History of depression.   PHYSICAL EXAMINATION:   VITAL SIGNS: Temperature on arrival 98.9, initial blood pressure 193/113, and the last one 176/89, respiratory rate 19, and oxygen saturation 98% on room air. Pulse rate was 91, last one.  GENERAL: An obese African American female lying in bed in moderate distress, heaving and actively vomiting.   HEENT: Normocephalic, atraumatic. Pupils are equal and reactive. Dry mucous membranes. Extraocular muscles are intact.   NECK: Supple. No thyroid tenderness.  RESPIRATORY: Clear to auscultation bilaterally without wheezing or rhonchi. Good effort.   CARDIOVASCULAR: S1 and S2 slightly tachycardic. No murmurs, rubs, or gallops.  ABDOMEN: Soft and nondistended. Generalized tenderness to palpation without any rebound or guarding. Hyperactive bowel sounds in all quadrants.   EXTREMITIES: No  significant lower extremity edema.   SKIN: No obvious rashes.   NEUROLOGIC: Cranial nerves II through XII grossly intact. Strength is 5 out of 5 in all extremities, however, neuro exam is limited due to patient being in discomfort and not fully cooperating.   PSYCH: Awake, alert, oriented x3; somewhat anxious.   LABORATORY, DIAGNOSTIC AND RADIOLOGIC DATA: BUN 5, creatinine 0.59, sodium 131, and serum CO2 9. Anion gap 23. Blood osmolality 342. Alcohol is elevated at 0.28%. Lipase 51. Total protein 9.7 and ALT 46. TSH 0.28. Urine tox positive for cannabinoids and opiates. WBC 23.5, hemoglobin 14.7, hematocrit 43.2, and platelets 352,000.  Urinalysis: 2+ ketones, 2+ blood, and no nitrites or leukocyte esterase.   ABG: pH 7.12, pCO2 25, pO2 94, base excess negative 19.7, and lactic acid 4.8.   CT of abdomen and pelvis with contrast is showing findings which may represent early or mild colitis within the ascending colon. No evidence of associated free air or loculated fluid collection.   Chest x-ray, portable, is showing no acute cardiopulmonary disease.   ASSESSMENT AND PLAN: We have a 39 year old [place] American female with history of alcohol abuse, polysubstance abuse, and depression with history of severe acidosis requiring dialysis previously which was thought to be secondary to possible salicylate ingestion as well as mouthwash who presents with severe acidosis again with elevated alcohol level and low serum CO2 and high osmolar gap with nausea, vomiting, abdominal pain, and hematemesis. At this point, we will admit the patient to the Critical Care Unit.   In regards to severe acidosis, it is unclear if there is a multifactorial component, but at least the patient does have alcoholic ketoacidosis likely. The patient will be checked for a salicylate level and ethylene glycol and Tylenol level as well. The patient had stopped drinking alcohol for several months after her previous discharge in  April but has started to drink again in the last week. At this point, we would start her on a bicarbonate drip, check a BMP q12 hours and obtain a nephrology consult.   In regards to abdominal pain, the patient described it as more burning and pain is worse with vomiting. It is possible the patient has been having alcoholic gastritis or esophagitis. Also Mallory-Weiss tears are possible in the setting of severe vomiting. There is also the possibility of variceal bleeding. We would obtain a gastroenterology consult and start the patient on PPI drip and octreotide drip. I will check hemoglobin every 8 hours as well. The patient has stable hemoglobin, but I suspect it will trend down given multiple episodes of hematemesis and the fact that the patient appears to be dehydrated. I have discussed the patient's condition with Dr. Gustavo Lah already. There might be a possibility of doing an EGD once the patient is stable to rule out portal gastropathy and variceal. We would also start the patient on around-the-clock Zofran and morphine for pain. In regards to abdominal pain, the CAT scan shows possibility of mild colitis. I would check stool cultures. There is no diarrhea, however. The patient does have significant leukocytosis and SIRS criteria given the tachycardia and leukocytosis. We would start Cipro and Flagyl and follow with stool cultures at this point.   The patient also had complained of some dysuria, but has a negative urinalysis. I would check urine cultures.   In regards to alcohol abuse,  I would start the patient on CIWA protocol. The patient does have elevated blood pressure currently, but I think it is likely in the setting of her acute pain. We would follow with CIWA protocol and clonidine p.r.n. I would start the patient on SCDs and TEDs for deep vein thrombosis prophylaxis.   The case was discussed with Dr. Candiss Norse and Dr. Gustavo Lah and the patient.     CODE STATUS: FULL CODE   TOTAL CRITICAL  CARE TIME: 65 minutes.  ____________________________ Vivien Presto, MD sa:slb D: 06/29/2012 11:57:25 ET T: 06/29/2012 12:20:36 ET JOB#: [id number]  cc: Vivien Presto, MD, <Dictator> Vivien Presto MD ELECTRONICALLY SIGNED 07/10/2012 0:24

## 2015-02-16 NOTE — Consult Note (Signed)
CC: hematemesis.  EGD showed severe esophagitis worse moving proximal to distal.  Minimal focal gastritis.  Recommend Carafate slurry if tolerates, bid PPI start iv then oral.  Clear liquid then advance to full liquids.   Zofran and or phenergan if needed to prevent vomiting as vomiting could be very detrimental to her esophagus and possibly cause rupture.   Will follow with you.  Electronic Signatures: Manya Silvas (MD)  (Signed on 03-Sep-13 11:48)  Authored  Last Updated: 03-Sep-13 11:48 by Manya Silvas (MD)

## 2015-02-16 NOTE — Consult Note (Signed)
Serrano NAME:  Sarah Serrano, Sarah Serrano MR#:  [id number] DATE OF BIRTH:  May 16, 1976  DATE OF CONSULTATION:  07/01/2012  REFERRING PHYSICIAN:  Dr. Vivien Presto  CONSULTING PHYSICIAN:  Carvel Huskins B. Rowyn Spilde, MD  REASON FOR CONSULTATION: To evaluate Serrano with alcoholism.   IDENTIFYING DATA: Sarah Serrano is a 39 year old female with history of substance abuse and mood instability.   CHIEF COMPLAINT: "They are running a test."   HISTORY OF PRESENT ILLNESS: Sarah Serrano has a history of alcoholism and prescription pill abuse. She was admitted to medical floor for abdominal pain and GI bleed. She was consulted by Dr. Gustavo Lah who suggested EGD. Sarah Serrano is ambivalent; sometimes she agrees to it but to Dr. Gustavo Lah she said no. He suggested psychiatric consult. Sarah Serrano states to me that she is ready to undergone testing but would not want to stay in [facility] hospital and specifically she would not want to stay in [facility] hospital while receiving IV fluids because she believes that it limits her mobility. She also hates alarms in beds that would make nurses aware about her every move. She is rather irritable and short but makes a good attempt to cooperate in Sarah interview. She adamantly denies any symptoms of psychiatric problems. She does hold her stomach and complains of abdominal pain. She has not been eating. She is hardly able to hold down a drop of water. She denies psychotic symptoms, symptoms of anxiety. She denies other than alcohol, illicit substance use currently.   PAST PSYCHIATRIC HISTORY: Sarah Serrano was diagnosed in Sarah past with depression and anxiety. She has been admitted several times for substance abuse treatment, at least 3 times in [place], in [place] and at [facility] in April 2013. She reports drinking alcohol. She relapsed two weeks ago. She has used cocaine, marijuana in Sarah past. She has a history of narcotic painkiller abuse. She was able to maintain sobriety by  going to [place] and NA meetings.   PAST MEDICAL HISTORY: Hypertension.   ALLERGIES: No known drug allergies.    MEDICATIONS ON ADMISSION:  1. Celexa 40 mg daily.  2. Trazodone 100 mg at bedtime. 3. Lithium 300 mg at bedtime. She has not been compliant with lithium.   SOCIAL HISTORY: She lives with her fiance. She is not forthcoming with information. From Sarah old chart I know that she is originally from [place]. She has a 57 year old son who lives with his father. She is divorced. She has a boyfriend now.    REVIEW OF SYSTEMS: CONSTITUTIONAL: No fevers or chills. No weight changes. EYES: No double or blurred vision. ENT: No hearing loss. RESPIRATORY: No shortness of breath or cough. CARDIOVASCULAR: No chest pain or orthopnea. GASTROINTESTINAL: Positive for abdominal pain and nausea. GENITOURINARY: No incontinence or frequency. ENDOCRINE: No heat or cold intolerance. LYMPHATIC: No anemia or easy bruising. INTEGUMENTARY: No acne or rash. MUSCULOSKELETAL: No muscle or joint pain. NEUROLOGIC: No tingling or weakness. PSYCHIATRIC: See history of present illness for details.   PHYSICAL EXAMINATION:  VITAL SIGNS: Blood pressure 132/85, pulse 108, respirations 20, temperature 98.8.   GENERAL: This is a well-developed, slightly obese female in no acute distress. Sarah rest of Sarah physical examination is deferred to her primary attending.   LABORATORY, DIAGNOSTIC AND RADIOLOGICAL DATA: Chemistries are within normal limits except for blood glucose of 105 and potassium 3.2. Blood alcohol level on admission 0.28. LFTs within normal limits except for AST of 46. TSH 0.22 with free thyroxine of 0.71. Lithium serum level less  than 0.2. Urine tox screen positive for cannabinoids, opiates. CBC within normal limits. Urinalysis is not suggestive of urinary tract infection. Serum acetaminophen and salicylates are low. Urine pregnancy test is negative.   MENTAL STATUS EXAMINATION: Sarah Serrano is examined on Sarah medical  floor. She is in bed. She is rather irritable and short, at times unable to answer simple questions properly. I spoke with her primary attending this morning. She seemed to be delirious. Delirium may still be present. She tells me that she agrees to EGD but again seems confused about when Sarah test is going to be and surprised that she will have to wait until tomorrow. She is oriented to person, place, time, and somewhat to situation. She is marginally cooperative. There is limited eye contact. Her speech is pressured at times. Her mood is fine with expansive affect. Thought processing is logical. Thought content: She denies thoughts of hurting herself or others but refuses important laboratory testing. There are no delusions or paranoia. There are no auditory or visual hallucinations. Her cognition is grossly intact but Sarah Serrano is unwilling to participate in a formal assessment. Her insight and judgment are questionable.   SUICIDE RISK ASSESSMENT: This is a Serrano with history of mood instability and alcoholism who is most likely in alcohol withdrawal delirium who wants to leave [place] and refuses EGD.   DIAGNOSIS:  AXIS I:  1. Alcohol dependence.  2. Opioid, cannabis and cocaine abuse by history.   AXIS II: Personality disorder, not otherwise specified.   AXIS III:  1. GI bleed. 2. Alcoholic fatty liver.   AXIS IV: Mental illness, substance abuse, primary support, noncompliance with treatment.   AXIS V: GAF 35.   PLAN:  1. Sarah Serrano is threatening to leave Sarah hospital. We will initiate involuntary inpatient psychiatric commitment.  2. Will continue Celexa and trazodone for sleep and depression. We, will hold lithium for now as Sarah Serrano was noncompliant and I am not sure who prescribes it now. 3. I will follow up.   ____________________________ Herma Ard B. Bary Leriche, MD jbp:cms D: 07/01/2012 18:40:16 ET T: 07/02/2012 09:40:57 ET JOB#: [id number]  cc: Nia Nathaniel B.  Bary Leriche, MD, <Dictator> Clovis Fredrickson MD ELECTRONICALLY SIGNED 07/03/2012 23:57

## 2015-02-16 NOTE — Consult Note (Signed)
Chief Complaint:   Subjective/Chief Complaint unable to do EGD today due to hypokalemia and concern of acidosis noted on anesthesia review.  Patient rescheduled to tomorrow.  Recheck electrolytes in am.   Electronic Signatures: Loistine Simas (MD)  (Signed 02-Sep-13 22:03)  Authored: Chief Complaint   Last Updated: 02-Sep-13 22:03 by Loistine Simas (MD)

## 2015-02-16 NOTE — Consult Note (Signed)
PATIENT NAME:  Sarah Serrano, HORNSBY MR#:  [id number] DATE OF BIRTH:  1975/12/05  DATE OF CONSULTATION:  10/10/2012  REFERRING PHYSICIAN:  Fulton Reek, MD  CONSULTING PHYSICIAN:  Gaylyn Cheers, MD/Dejanae Helser A. Jerelene Redden, ANP  REASON FOR CONSULTATION: Nausea and vomiting.   HISTORY OF PRESENT ILLNESS: This is a 39 year old patient with history of alcohol abuse, hypertension, depression, and polysubstance abuse who was recently hospitalized in August for severe nausea, vomiting, and abdominal pain. She was severely acidotic and required hemodialysis. Dr. Vira Agar and Dr. Gustavo Lah attended to her case three months ago and she had an upper endoscopy that showed LA grade D reflux and acute erosive esophagitis as well as gastritis. The patient was treated with IV medication, Carafate. She left the hospital [place] and did not continue any further treatment for severe erosive esophagitis. She now presents with continued alcohol abuse, continued polysubstance abuse, and has had flare of nausea and vomiting starting over the last few days. She saw a little bit of blood at the end of the vomiting-no coffe grounds. The patient is a poor historian at this time.   PAST MEDICAL HISTORY:  1. Alcohol abuse.  2. Polysubstance abuse.  3. Anxiety/depression.  4. Hypertension.  5. History of hepatic steatosis.  6. Renal cyst.  7. Chronic anemia.  8. Thrombocytopenia.  9. Severe acidosis requiring hemodialysis with 08/31 through 07/04/2012 admission.  10. Chronic electrolyte disorder.  11. Personality disorder.  12. Severe esophagitis/gastritis found, severe LA grade D esophagitis as well as gastritis found per EGD 07/02/2012.   MEDICATIONS ON ARRIVAL: None listed.   ALLERGIES: No known drug allergies.   HABITS: The patient smokes 1 pack per day. She states she has not had any alcohol in 1-1/2 weeks and at that time it was some liquor. Says she stopped hard liquor. Vague historian.   FAMILY  HISTORY: Positive for alcohol abuse.   REVIEW OF SYSTEMS: The patient has received Ativan orally. She complains of abdominal pain from vomiting. She said soreness started after vomiting. She denies fevers or chills. She denies difficulty swallowing, food hanging, or painful swallowing. She denies chest pain or shortness of breath. She says she's not getting enough pain medication or enough pain relief. The patient admits to feeling depressed and anxious. Remaining 10 systems negative-although she is a vague and wondering historian.  PHYSICAL EXAMINATION:   VITAL SIGNS: Temperature 98, heart rate 89, respirations 22, blood pressure 154/78, 98% on 2 liters nasal cannula.   GENERAL: Well nourished African American female sitting up in bed. She is a little restless, tearful, difficult to obtain history as she directs conversation to the ill treatment she received on her last admission, repeated attempts to discuss the reasons why she left the hospital.   HEENT: Head is normocephalic. Conjunctivae pink. Trachea is midline.   NECK: Supple.   HEART: Heart tones S1, S2 without murmur.   LUNGS: Clear to auscultation.   ABDOMEN: Soft. Positive musculoskeletal tenderness. Bowel sounds are present. Soft abdomen.   RECTAL: Deferred.   EXTREMITIES: Without edema.   SKIN: Warm and dry. Affect and mood as noted.   EXTREMITIES: Equal bilateral. Follows commands. No tremors noted.   LABORATORY, DIAGNOSTIC, AND RADIOLOGICAL DATA: Admission blood work notable for BUN 4, creatinine 0.7, potassium 4.2 now down 2.6 today, magnesium 0.6, lipase 55, albumin 3.5 to 3.1, total bilirubin 1.3 to 1.2, alkaline phosphatase 58. Liver enzymes otherwise unremarkable. Troponin negative. Lithium low 0.50. Urine drug screen positive for cannabinoid, opiate, MDMA. Negative  for cocaine. Hemoglobin is 11 to 11.1, platelet count 229 down to 127 today. MCV is 102. Pregnancy test negative. Urine was positive for RBCs, 6 WBCs,  epithelial cells, mucus.   Chest x-ray, PA and lateral, obtained on admission was normal.   IMPRESSION: This patient has a history of significant alcohol abuse and presents with vomiting, associated musculoskeletal tenderness from repetitive vomiting. She has known severe erosive esophagitis as well as gastritis per recent EGD 07/02/2012. The patient left the hospital AGAINST MEDICAL ADVICE and did not take Carafate or proton pump inhibitor on discharge. She presents with similar presentation, although she admits that she is not as severe as she was in August.   PLAN:  1. Recommend IV Protonix, Carafate slurry.  2. Agree with drug and alcohol counseling.  3. The patient is emotionally labile during this evaluation with tearfulness, depression, admitted, and there is denial about alcohol use.  4. Further recommendations pending. This case was discussed with Dr. Vira Agar.    These services provided by Joelene Millin A. Jerelene Redden, ANP under collaborative agreement with Gaylyn Cheers, MD.   ____________________________ Janalyn Harder. Jerelene Redden, ANP kam:drc D: 10/10/2012 18:08:04 ET T: 10/11/2012 07:35:28 ET JOB#: [id number]  cc: Joelene Millin A. Jerelene Redden, ANP, <Dictator> Janalyn Harder. Sherlyn Hay, MSN, ANP-BC Adult Nurse Practitioner ELECTRONICALLY SIGNED 10/11/2012 8:54

## 2015-02-16 NOTE — Consult Note (Signed)
Details:    - Psychiatry: Came back to follow up on consult. PAtient no longer here. Seems to have left AMA. No further input.   Electronic Signatures: Clapacs, Madie Reno (MD)  (Signed 13-Dec-13 16:04)  Authored: Details   Last Updated: 13-Dec-13 16:04 by Gonzella Lex (MD)

## 2015-02-16 NOTE — Consult Note (Signed)
Brief Consult Note: Diagnosis: coffeeground emesis in the setting of etoh abuse.   Patient was seen by consultant.   Recommend further assessment or treatment.   Discussed with Attending MD.   Comments: Patient seen and examined full consult to follow.  Patietn presenting with a week of heavy etoh consumption (a fifth of vodka daily) after about 2-3 months of described abstinence. This past week using marijuana and etoh, several days of nausea and abdominal pain, states last etoh a day or so ago.  Material in emesis is cola colored, no red.  Patietn states she saw some black in the stool last week but apparently not since. Abdominal pain mostly epigastric and ruq, no rebound, moderate. Lfts mild /minimal etoh pattern.  Hemodynamically stable, normal hgb, although may be volume contracted/dehydrated.  Impression: etoh related gastropathy in the setting of long term etoh abuse. Acidosis possibly related to etoh ketosis.  Recommend octreotide bolus and drip, scheduled antiemetics, iv ppi.  Correct acidosis. EGD when clinically feasible, serial hgb with rehydration. Discussed with Dr Bridgette Habermann.  Electronic Signatures for Addendum Section:  Loistine Simas (MD) (Signed Addendum 31-Aug-13 12:55)  full consult [phone]   Electronic Signatures: Loistine Simas (MD)  (Signed 31-Aug-13 12:38)  Authored: Brief Consult Note   Last Updated: 31-Aug-13 12:55 by Loistine Simas (MD)

## 2015-02-16 NOTE — Consult Note (Signed)
CC severe esophagitis.  I spent 15 min talking to her about her esophagitis, diet, medication needed, avoid alcohol, find other coping mechanisms. Diet needs to be ultra soft, pureed, full liquid, mashed potatoes type food and take PPI and carafate slurry.  She can follow up in our office in 2-3 weeks for the esophagitis.  Electronic Signatures: Manya Silvas (MD)  (Signed on 04-Sep-13 15:00)  Authored  Last Updated: 04-Sep-13 15:00 by Manya Silvas (MD)

## 2015-02-16 NOTE — Consult Note (Signed)
Brief Consult Note: Diagnosis: Alcohol dependence.   Patient was seen by consultant.   Recommend further assessment or treatment.   Orders entered.   Discussed with Attending MD.   Comments: Ms. Ketchem is an alcoholic. She relapsed 2 weeks ago. Here for abdominal pain and GI bleed. The patient threatened this am to leave AMA. She initially refused EGD but now is agreable.   PLAN: 1. Please continue CIWA protocol.   2. Will continue Clelexa and Trazodone for depression and insomnia. She has been off Lithium lately. Will hold it.   3. I will follow up.  Electronic Signatures: Orson Slick (MD)  (Signed 02-Sep-13 15:54)  Authored: Brief Consult Note   Last Updated: 02-Sep-13 15:54 by Orson Slick (MD)

## 2015-02-16 NOTE — Consult Note (Signed)
Brief Consult Note: Diagnosis: Alcohol dependence.   Patient was seen by consultant.   Consult note dictated.   Recommend further assessment or treatment.   Orders entered.   Discussed with Attending MD.   Comments: Sarah Serrano is an alcoholic. She relapsed 2 weeks ago. Here for abdominal pain and GI bleed, kidney failure. The patient initially threatened to leave AMA and was IVCed. She initially refused treatment but is cooperative and agreable to treatment.   MSE: Alert, oriented, pleasant, polite apologetic. She wants to get well. She is ready to stay in the hospital as long as necessary but "not too long". She has someone sitting with her dogs and worries about money. She understands that she needs to change her lifestyle. She is ready to start psychotherapy for substance abuse.   PLAN: 1. Please continue CIWA protocol.   2. Will continue Celexa and Trazodone for depression and insomnia. She has been off Lithium lately. Will hold it.   3. She was given information about [facility], new provider in town dedicated to mental illness and substance abuse treatment. Her appointment there is on 09/10 at 11:00 am with [facility]. The provider is within a walking distance from her residence.    4. I will sign off.  Electronic Signatures: Orson Slick (MD)  (Signed 04-Sep-13 17:50)  Authored: Brief Consult Note   Last Updated: 04-Sep-13 17:50 by Orson Slick (MD)

## 2015-02-16 NOTE — Discharge Summary (Signed)
PATIENT NAME:  Sarah Serrano, Sarah Serrano MR#:  [id number] DATE OF BIRTH:  07-14-1976  DATE OF ADMISSION:  06/29/2012 DATE OF DISCHARGE:  07/04/2012  NOTE: The patient walked out [place] on 07/04/2012.   CONSULTANTS:  1. Dr. Gustavo Lah from GI. 2. Dr. Vira Agar from GI.  3. Dr. Bary Leriche from Psychiatry.  4. Dr. Delana Meyer from Vascular Surgery.  5. Dr. Candiss Norse from Nephrology.   DIAGNOSES: Diagnoses at the time of walking out [place].  1. Severe acidosis, likely from alcoholic ketoacidosis, requiring bicarbonate drip and one-time dialysis. 2. Polysubstance abuse, including marijuana, opioids and alcohol.  3. Abdominal pain from gastritis, and severe esophagitis seen on esophagogastroduodenoscopy done on 07/02/2012.  4. Possible mild colitis seen on the computerized tomography of the abdomen.  5. Volume depletion.  6. Hypertension.  7. Hypokalemia.  8. Hypomagnesemia.  9. Significant alcohol abuse with alcohol withdrawal.  10. Chronic alcohol abuse.  11. Personality disorder.   MEDICATIONS:  (At time of leaving [place]) 1. Tegretol 200 mg b.i.d.  2. Celexa 20 mg daily.  3. Lidocaine 2% viscous solution, 15 mL t.i.d.  4. Lorazepam 0.5 mg every 6 hours. 5. Nicotine patch 21 mg transdermal daily.  6. Protonix 40 mg p.o. b.i.d.  7. Potassium 40 mEq daily. 8. Percocet 5/325, 2 tabs every 6 hours p.r.n.  9. Clonidine 0.1 mg every 3 hours p.r.n. for hypertension.  10. Haldol p.r.n.  11. CIWA  protocol. 12. Phenergan 12.5 mg IV every 4 hours p.r.n. for nausea. 13. Zofran 4 mg IV every 4 hours p.r.n. for nausea.   SIGNIFICANT LABORATORY, DIAGNOSTIC AND RADIOLOGICAL DATA: Creatinine on arrival 0.59, sodium 131. Ethanol level elevated at 0.28. Lipase on arrival 51. Serum osmolality 342, calculated was 263, potassium on arrival 4.0. LFTs on arrival: AST 46, total protein 9.7, otherwise within normal limits. TSH 0.28, free thyroxine 0.71. Serum lithium less  than 0.2. Urine toxicity screen positive for cannabinoids and opiates. WBC on arrival 23.5 as of September 2nd and normalized to 9.3. Initial hemoglobin 14.7. Urinalysis was not suggestive of infection. Serum acetaminophen level within normal limits. Chlamydia and gonorrhea are negative. Serum ethylene glycol nondetected. Serum salicylates 2.4. Volatile serum acetone was high at 0.018% as well as ethanol at 0.54%. Initial ABG showed a pH of 7.12, pCO2 of 25, pO2 of 94. Lactic acid 4.8. CT of abdomen and pelvis with contrast: Evaluation of the ascending colon demonstrates a finding concerning for mild-to-moderate diffuse bowel wall thickening. Alternatively, these findings may represent nondistention. No evidence of significant inflammatory changes within the surrounding mesenteric fat nor free fluid. No evidence of bowel obstruction or enteritis. Possible mild colitis within the descending colon.[place] of the chest, one view on arrival: No acute cardiopulmonary disease. Upper gastrointestinal endoscopy done on 07/02/2012 showing grade D reflux, acute erosive esophagitis and gastritis.   HISTORY AND PHYSICAL: For full details of History and Physical, please see the dictation on 06/29/2012 by Dr. Bridgette Habermann. Briefly, this is a 39 year old African American female with significant alcohol abuse and withdrawals in the past, depression, hepatocytosis, who presented with nausea, vomiting, and severe abdominal pain and was found with severe acidosis and serum CO2 of 9, with pH of 7.12 on ABG.  The patient was also experiencing hematemesis for several days after she recently resorted to alcohol drinking again and was admitted to the Critical Care Unit.   HOSPITAL COURSE:  1. Severe acidosis: The patient did have acidosis as described above. Initial thought was perhaps this  is multifactorial, and multiple labs were sent including serum volatiles, ethylene glycol, salicylates, Tylenol and lactic acid. Lactic acid was  elevated as well as a serum ethanol, and the patient was seen by Nephrology. She was started on bicarbonate drip, and the patient was also dialyzed x1 and this acidosis resolved.  2. Abdominal pain: This was likely in the setting of alcohol and reflux, gastritis and esophagitis. The patient was started on Protonix drip as well as octreotide drip on arrival as she also had some hematemesis and some coffee-ground. She was seen by GI. She was also started on Carafate and started on an n.p.o. diet initially. The initial CAT scan of the abdomen had also shown possible mild colitis, and the patient did have significant leukocytosis on arrival and, therefore, was started on Cipro and Flagyl as well. The patient underwent an EGD, the result of which is above, and had severe dysphagia with burning in the esophagus and gastritis symptoms. She was started on around-the-clock high-dose Zofran as well as Phenergan, IV fluids, and Protonix drip. The leukocytosis did reverse; and after five days Cipro and Flagyl were held, however, the patient has significant abdominal pain and morphine was continued. The patient was seen by Dr. Vira Agar as well as Dr. Gustavo Lah from GI and counseled by me multiple times about not using alcohol. She was seen by Psychiatry as well for the her alcohol abuse. The nausea, vomiting, and hematemesis, resolved, and hemoglobin was stable. The PPI was changed to p.o. b.i.d. and octreotide drip was discontinued. She was started on Percocet, and morphine was unchanged. 3. Alcohol abuse: The patient did have elevated CIWA  scores on this admission and needed Ativan. The patient was also tremulous and tachycardic with high blood pressures and was getting Ativan and at that time wanted to walk out [place] even prior to resolution of her nausea and vomiting prior to the EGD. She was seen by Psychiatry and deemed to lack capacity at that time to make medical judgments adequately, and therefore she  was committed. Her lithium was held. She was not taking the medication anyway, and she was started on Celexa. Later she was also started on Tegretol for mood stabilization, and as her symptoms improved her commitment was reversed. The patient did have bouts of hypokalemia and hypomagnesemia which were repleted, and her blood pressures did come down; however, the patient walked out [place] on September 5th. At that time she had already had multiple counseling sessions by Psychiatry, and I had multiple conversations with the patient as well; and she was given information about [facility] and an appointment was given with Heather at 07/09/2012 at 11:00 a.m.   CODE STATUS:  The patient is FULL CODE.     TOTAL TIME SPENT: 40 minutes.  ____________________________ Vivien Presto, MD sa:cbb D: 07/05/2012 17:58:42 ET T: 07/08/2012 12:53:20 ET JOB#: [id number] Vivien Presto MD ELECTRONICALLY SIGNED 07/19/2012 14:20

## 2015-02-16 NOTE — Consult Note (Signed)
Pt seen by NP and we discussed her case.  I came to see pt but she was in CT.  Will see tomorrow.  Due to ulcerative esophagitis seen previously recommend d/c toradol.  Electronic Signatures: Manya Silvas (MD)  (Signed on 12-Dec-13 17:53)  Authored  Last Updated: 12-Dec-13 17:53 by Manya Silvas (MD)

## 2015-02-16 NOTE — Consult Note (Signed)
Brief Consult Note: Diagnosis: Alcohol dependence, mood disorder NOS.   Patient was seen by consultant.   Consult note dictated.   Recommend further assessment or treatment.   Orders entered.   Discussed with Attending MD.   Comments: Sarah Serrano has a h/o mood instability and alcoholism. She was discharged from [facility] recently but has not been compliant with Lithium as it made her into a "zombie". She relapsed on alcohol 2 weeks ago. Here for abdominal pain and GI bleed, kidney failure. The patient initially threatened to leave AMA and was placed on IVC. She is cooperative and agreable to treatment.   MSE: Alert, oriented, pleasant, polite apologetic. She wants to get well. She is ready to stay in the hospital as long as necessary but "not too long". She has someone sitting with her dogs and worries about money. She understands that she needs to change her lifestyle. She is ready to start psychotherapy for substance abuse.   PLAN: 1. She completed iv CIWA protocol but is still tremulous. I will extend standing Ativan taper for the next two days.   2. Will continue Celexa and Trazodone for depression and insomnia. We will add Tegretol for mood stabilization.    3. She was given information about [facility], new provider in town dedicated to mental illness and substance abuse treatment. Her appointment there is on 09/10 at 11:00 am with [facility]. The provider is within a walking distance from her residence.    4. Spoke with [facility] about [facility] application.   5. I will d/c IVC.  Electronic Signatures: Orson Slick (MD)  (Signed 05-Sep-13 13:39)  Authored: Brief Consult Note   Last Updated: 05-Sep-13 13:39 by Orson Slick (MD)

## 2015-02-16 NOTE — H&P (Signed)
PATIENT NAME:  Sarah Serrano, Sarah Serrano MR#:  [id number] DATE OF BIRTH:  14-Jul-1976  DATE OF ADMISSION:  10/09/2012  REFERRING PHYSICIAN: Dr. Cinda Quest.   FAMILY PHYSICIAN: None local.   REASON FOR ADMISSION: Intractable nausea and vomiting.   HISTORY OF PRESENT ILLNESS: The patient is a 39 year old female with a history of alcohol abuse, hypertension, and depression. She has a significant history of polysubstance abuse and was hospitalized in August with severe nausea, vomiting, and abdominal pain at which time she was severely acidotic and required hemodialysis. The patient now presents with a three day history of nausea, vomiting, epigastric pain, and burning. She is unable to keep food or liquids down. Her symptoms persisted in the Emergency Room and she is now admitted for further evaluation.   PAST MEDICAL HISTORY:  1. Alcohol abuse.  2. Anxiety/depression.  3. Benign hypertension.  4. History of hepatic steatosis.  5. Renal cyst.  6. Chronic anemia.  7. Chronic thrombocytopenia.  8. History of severe acidosis requiring hemodialysis.  9. Chronic electrolyte disorder.   MEDICATIONS: None.   ALLERGIES: No known drug allergies.   SOCIAL HISTORY: The patient smokes 1 pack per day. She states that she drinks alcohol regularly but not on a daily basis any longer. She has had no alcohol now in approximately one week.   FAMILY HISTORY: Positive for alcohol abuse but otherwise unremarkable.   REVIEW OF SYSTEMS: CONSTITUTIONAL: No fever or change in weight. EYES: No blurred or double vision. No glaucoma. ENT: No tinnitus or hearing loss. No nasal discharge or bleeding. No difficulty swallowing. RESPIRATORY: No cough or wheezing. Denies hemoptysis. CARDIOVASCULAR: No chest pain or orthopnea. No palpitations or syncope. GI: No diarrhea. GU: No dysuria or hematuria. No incontinence. ENDOCRINE: No polyuria or polydipsia. No heat or cold intolerance. HEMATOLOGIC: The patient denies anemia, easy bruising,  or bleeding. LYMPHATIC: No swollen glands. MUSCULOSKELETAL: The patient denies pain in her neck, back, shoulders, knees, or hips. No gout. NEUROLOGIC: No numbness or migraines. Denies stroke or seizures. PSYCH: The patient denies anxiety, insomnia, or depression.   PHYSICAL EXAMINATION:   GENERAL: The patient is in no acute distress.   VITAL SIGNS: Vital signs remarkable for a blood pressure of 156/96 with a heart rate of 99 and a respiratory rate of 16.   HEENT: Normocephalic, atraumatic. Pupils equally round and reactive to light and accommodation. Extraocular movements are intact. Sclerae are nonicteric. Conjunctivae are clear. Oropharynx is dry but clear.   NECK: Supple without JVD. No adenopathy or thyromegaly is noted.   LUNGS: Clear to auscultation and percussion without wheezes, rales, or rhonchi. No dullness.   CARDIAC: Regular rate and rhythm with a normal S1 and S2. No significant rubs, murmurs, or gallops. PMI is nondisplaced. Chest wall is nontender.   ABDOMEN: Soft but mildly tender. No rebound or guarding. Hyperactive bowel sounds.   EXTREMITIES: Without clubbing, cyanosis, edema. Pulses were 2+ bilaterally.   SKIN: Warm and dry without rash or lesions.   NEUROLOGIC: Cranial nerves II through XII grossly intact. Deep tendon reflexes were symmetric. Motor and sensory exams nonfocal.   PSYCH: Alert and oriented to person, place, and time. She was cooperative and used good judgment.   LABORATORY DATA: Urinalysis was unremarkable. White count was 11.2 with a hemoglobin of 11.0. Lipase was 55. Glucose 88 with a BUN of 4, creatinine 0.70 with a sodium of 142 and a potassium of 4.2. GFR was greater than 60. Troponin less than 0.02.   ASSESSMENT:  1. Intractable  nausea and vomiting.  2. Alcohol abuse.  3. Tobacco abuse.  4. Hypertension.  5. History of hepatic steatosis.  6. Renal cyst.  7. Chronic anemia.   PLAN:  1. The patient will be observed on the floor with IV  fluids and will use IV Zofran as needed for nausea and vomiting.  2. Will begin IV Reglan q.6 hours as well as Carafate.  3. Will obtain an abdominal ultrasound as well as a GI consult.  4. Follow-up routine labs in the morning.  5. Further treatment and evaluation will depend upon the patient's progress.   TOTAL TIME SPENT ON THIS PATIENT: 45 minutes.   ____________________________ Leonie Douglas Doy Hutching, MD jds:drc D: 10/09/2012 21:08:25 ET T: 10/10/2012 06:19:18 ET JOB#: [id number]  cc: Leonie Douglas. Doy Hutching, MD, <Dictator> Meilin Brosh Lennice Sites MD ELECTRONICALLY SIGNED 10/10/2012 6:50

## 2015-02-16 NOTE — Op Note (Signed)
PATIENT NAME:  Sarah Serrano, Sarah Serrano MR#:  [id number] DATE OF BIRTH:  01/03/1976  DATE OF PROCEDURE:  06/29/2012  PREOPERATIVE DIAGNOSES:  1. Acidosis.  2. Alcohol poisoning.  3. Abdominal pain.   POSTOPERATIVE DIAGNOSES:    1. Acidosis.  2. Alcohol poisoning.  3. Abdominal pain.   PROCEDURE PERFORMED: Insertion of right IJ triple-lumen temporary dialysis catheter with ultrasound guidance.   SURGEON: Katha Cabal, MD.   DESCRIPTION OF PROCEDURE: The patient is in the Intensive Care Unit critically ill. She is positioned supine. The right neck is prepped and draped in a sterile fashion. Ultrasound is placed in a sterile sleeve. Jugular vein is identified. It is echolucent and compressible indicating patency. Image is recorded. Under direct ultrasound visualization after 1% lidocaine has been infiltrated, the micropuncture needle is inserted into the jugular vein, microwire followed by micro sheath, J-wire followed by dilator and the triple-lumen temporary dialysis catheter. All three lumens are then aspirated and flushed easily and the catheter is secured to the neck with 2-0 nylon. Sterile dressing is applied with a Biopatch. The patient tolerated the procedure well. Chest x-ray demonstrates good position of the catheter with no evidence of hemopneumothorax.   ____________________________ Katha Cabal, MD ggs:ap D: 06/29/2012 17:14:24 ET T: 06/30/2012 14:57:02 ET JOB#: [id number]  cc: Katha Cabal, MD, <Dictator> Katha Cabal MD ELECTRONICALLY SIGNED 07/05/2012 16:24

## 2015-02-16 NOTE — Consult Note (Signed)
Brief Consult Note: Diagnosis: Vomiting, known severe erosive esophagitis and gastritis per recent EGD 07/02/12, Etoh abuse suspect the same. Cannot r/o viral illness as acute etiology of abd pain/vomiting given her immunocompromised state.   Patient was seen by consultant.   Comments: Toradol is contraindicated in any erosive gi condition. Continue with IV protonix, carafate slurry. Drug and ETOH counseling. No recommendations for repeat EGD at this time. Monitor pt progress. This case was d/w Dr. Vira Agar in collaboration of care.  Electronic Signatures: Gershon Mussel (NP)  (Signed 12-Dec-13 17:08)  Authored: Brief Consult Note   Last Updated: 12-Dec-13 17:08 by Gershon Mussel (NP)

## 2015-02-19 NOTE — Discharge Summary (Signed)
PATIENT NAME:  [name], Sarah Serrano MR#:  [id number] DATE OF BIRTH:  1976/07/05  DATE OF ADMISSION:  02/13/2013 DATE OF DISCHARGE:  02/13/2013  The patient signed out [place] today, 02/13/2013.   REASON FOR ADMISSION:  Generalized body aching.  HOSPITAL COURSE: The patient is a 39 year old African American female with a history of heavy alcohol abuse, substance abuse presented to the ED with complaints of generalized body aches. She was found to have an alcohol level 335, also have multiple lab abnormalities like elevated white count at 17,000, ABG showed pH 7.3. The patient was placed for observation. The patient has been requesting narcotics and was yelling loudly in ED. For detailed history and physical examination, please refer to the admission note dictated by Dr. Lunette Stands. After patient transferred to the floor, the patient still complains of generalized body pain, request IV pain medication. Since we did give the patient IV pain medication, the patient signed out [place] today.   FINAL DIAGNOSES: Alcohol intoxication, substance abuse, acidosis, hypertension, generalized body aches, tobacco abuse, alcohol abuse.    ____________________________ Demetrios Loll, MD qc:ce D: 02/13/2013 18:08:26 ET T: 02/13/2013 18:24:47 ET JOB#: [id number]  cc: Demetrios Loll, MD, <Dictator> Demetrios Loll MD ELECTRONICALLY SIGNED 02/14/2013 10:12

## 2015-02-19 NOTE — Consult Note (Signed)
Brief Consult Note: Diagnosis: limited IV access.   Comments: I conferred with RN, who confirmed patient has a functioning EJ. I placed an order for a PICC to be placed tomorrow. If patient loses her IV access over night, the RN will call me and I will place a CVC. O/W I believe a PICC would be advantageous.  Electronic Signatures: Consuela Mimes (MD)  (Signed 07-Apr-14 19:26)  Authored: Brief Consult Note   Last Updated: 07-Apr-14 19:26 by Consuela Mimes (MD)

## 2015-02-19 NOTE — Consult Note (Signed)
Brief Consult Note: Diagnosis: Alcohol dependence, Opioid dependence, Alcohol withdrawal delirium.   Patient was seen by consultant.   Consult note dictated.   Recommend further assessment or treatment.   Orders entered.   Comments: Sarah Serrano has a h/o substance dependence per her own admission. She came to the hospital on 4/4 asking for detox from alcohol and heroin. She has a h/o delirium tremens. She was drunk BAL 294 and positive for cannabinoid and methadone. She was admitted to CCU. Since admission, she received multiple doses of ativan: 4/5-14 mg, 4/6-6 mg, 4/7-13 mg that is now used for agitation as she may not receive antipsychotics due to QTc elongation. She also received multiple dose of Morphine for undefined pain of abdomen, hand, right flank and leg: 4/5-1 mg, 4/6-5 mg, 4/7 6 mg.   The patient is oriented to person, place and time. Not so much to situation. Per nursing staff, there are periods of confusion and agitation.   PLAN: 1. Alcohol detox-at this point the patient should be taken off ativan quickly and given standing rather than prn doses.  2. Opioid  dependence-given h/o addiction, opioids should be avoided unless we know what we are treating.   3. I will follow up..  Electronic Signatures: Orson Slick (MD)  (Signed 07-Apr-14 23:58)  Authored: Brief Consult Note   Last Updated: 07-Apr-14 23:58 by Orson Slick (MD)

## 2015-02-19 NOTE — Discharge Summary (Signed)
PATIENT NAME:  Sarah Serrano, Sarah Serrano MR#:  [id number] DATE OF BIRTH:  February 16, 1976  DATE OF ADMISSION:  01/31/2013 DATE OF DISCHARGE:  02/05/2013  DISCHARGE DIAGNOSES:    1.  Delirium tremens due to alcohol withdrawal.  2.  Abdominal pain.    3.  Severe electrolyte imbalance on presentation, with rest and corrected.  4.  Metabolic acidosis on presentation due to starvation 5.  Elevated white blood cell count.  Possibly reactivity to stress.  6.  Drug abuse.  7.  Macrocytic anemia.   Discharge medications and instructions were not given as patient left AMA without me seeing or having information about the patient.   HISTORY OF PRESENT ILLNESS:  A 39 year old female who was in the ER with delirium, was saying that she was drinking for the last 4 to 5 days and started having abdominal pain, not eating anything for the last four days, having vomiting and delirium, shouting for help and not able to give any history in the ER, was found having severe acidosis and electrolyte imbalance and so admitted to medical floor for further management.     HOSPITAL COURSE AND STAY:   1.  For the acid-base disorders she was having on presentation, due to starvation ketosis and vomiting and dehydration.  We gave IV normal saline, also gave D5 normal saline intermittently.  Rechecked electrolytes and that was getting slowly corrected.  Within the next 1 or 2 days her acid-based balance and electrolyte imbalance got corrected.  2.  Delirium tremens.  She was severely delirious on presentation due to alcohol withdrawal.  CIWA protocol was started.  Haldol and Ativan was continued as needed.  Psychiatric consult was done.  3.  Elevated white cell count.  Possibly it was due to delirium tremor and acidosis.  4.  Drug abuse.  She was taking multiple drugs like methadone, cannabinoids.  The plan was to transfer her to psych facility for detox from the drug, but she signed [facility] before this.    LABORATORY  RESULTS:  On presentation potassium was 3.3 which came down 2.9 and later on corrected.  She had severe acidosis with CO2 level of 11 on presentation, anion gap was 26, which was corrected gradually over the next day.  Ethanol level was 294 on presentation.  Lipase was 644.  Lithium level was less than 0.02.  Urine toxicology was positive for cannabinoids, methadone.  Total WBC count was 18,300 on presentation which came down to 8600.  Blood culture is negative.  ABG on presentation, pH 7.30, pCO2 20 and pO2 was 82.  Chest x-ray on presentation, no acute cardiopulmonary abnormality.   TOTAL TIME SPENT IN DISCHARGE SUMMARY:  30 minutes.    ____________________________ Ceasar Lund Anselm Jungling, MD vgv:ea D: 02/08/2013 15:53:18 ET T: 02/09/2013 06:41:54 ET JOB#: [id number]  cc: Ceasar Lund. Anselm Jungling, MD, <Dictator> Vaughan Basta MD ELECTRONICALLY SIGNED 02/09/2013 22:27
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
## 2015-02-19 NOTE — Consult Note (Signed)
General Aspect 39 year old female with a long hx of polysubstance abuse, presenting after ETOH intoxication, in state of delirium,  abdominal pain, not eating anything for the last 4 days, vomiting, shouting for help. ardiology has been consulted for prolonged QTc.  Not able to get any history from the patient. She has been aggitated since arrival, requring precedex, haldol IV, ativan. On arrival, ER physician called psychiatry, but they wanted to admit to medicine for her acute medical issues. Once she recovers, she can be transferred for detox.   She has had poor po intake, combative, "not thinking straight" per the nurses.  Chart indicates h/o of methadone abuse, MAJ, previous heroin.   PAST MEDICAL HISTORY:   alcohol abuse, polysubstance abuse anxiety and depression,  benign hypertension,  history of hepatic steatosis,  renal cyst,  chronic anemia,  chronic thrombocytopenia,  severe acidosis requiring hemodialysis in the past,  chronic electrolyte disorders.    ALLERGIES: No known drug allergies.   SOCIAL HISTORY:  She smokes 1 pack per day, drinks alcohol regularly, has been drinking for the last 5 days continuously.   FAMILY HISTORY:  Positive for alcohol abuse, otherwise unremarkable.   Physical Exam:  GEN well developed   HEENT pink conjunctivae   NECK supple    RESP normal resp effort  clear BS    CARD Regular rate and rhythm  Normal, S1, S2  No murmur    ABD soft    EXTR negative edema   SKIN normal to palpation   NEURO unable to test   [facility] sedated   Review of Systems:  ROS Pt not able to provide ROS    Medications/Allergies Reviewed Medications/Allergies reviewed           Admit Diagnosis:   DT, ACIDOSIS,ALCOHOL WITHDRAWAL DELIRIUM: Onset Date: 02-Feb-2013, Status: Active, Description: DT, ACIDOSIS,ALCOHOL WITHDRAWAL DELIRIUM  Home Medications:  Medication Instructions Status  ondansetron 4 mg oral tablet 1 tab(s) orally every 8 hours, As  Needed- for Nausea, Vomiting  Active  lithium 300 mg oral tablet 1 tab(s) orally once a day Active   Lab Results:  Hepatic:  06-Apr-14 05:15   Bilirubin, Total 1.0  Alkaline Phosphatase 67  SGPT (ALT) 24  SGOT (AST)  43  Total Protein, Serum  6.2  Albumin, Serum  2.6  Routine Chem:  06-Apr-14 05:15   Glucose, Serum  180  BUN  1  Creatinine (comp)  0.38  Sodium, Serum 143  Potassium, Serum  2.5  Chloride, Serum  112  CO2, Serum 22  Calcium (Total), Serum  7.8  Osmolality (calc) 285  eGFR (African American) >60  eGFR (Non-African American) >60 (eGFR values <62m/min/1.73 m2 may be an indication of chronic kidney disease (CKD). Calculated eGFR is useful in patients with stable renal function. The eGFR calculation will not be reliable in acutely ill patients when serum creatinine is changing rapidly. It is not useful in  patients on dialysis. The eGFR calculation may not be applicable to patients at the low and high extremes of body sizes, pregnant women, and vegetarians.)  Result Comment POTASSIUM - RESULTS VERIFIED BY REPEAT TESTING.  - C/BYRON HADDOCK/0630/02-02-13/[facility]  - NOTIFIED OF CRITICAL VALUE  - READ-BACK PROCESS PERFORMED.  Result(s) reported on 02 Feb 2013 at 05:49AM.  Anion Gap 9  Routine Hem:  06-Apr-14 05:15   WBC (CBC) 8.6  RBC (CBC)  2.40  Hemoglobin (CBC)  9.1  Hematocrit (CBC)  26.0  Platelet Count (CBC) 238  MCV  108  MCH  37.9  MCHC 35.0  RDW  15.5  Neutrophil % 70.6  Lymphocyte % 26.4  Monocyte % 1.7  Eosinophil % 0.9  Basophil % 0.4  Neutrophil # 6.1  Lymphocyte # 2.3  Monocyte #  0.1  Eosinophil # 0.1  Basophil # 0.0 (Result(s) reported on 02 Feb 2013 at 05:49AM.)   EKG:  Interpretation EKG showing NSR with prolonged QT, initially 500 on arrival several days agi, now 563 morphine   Radiology Results:  XRay:    05-Apr-14 11:03, Chest Portable Single View  Chest Portable Single View   REASON FOR EXAM:    fever, elevated  wbc  COMMENTS:       PROCEDURE: DXR - DXR PORTABLE CHEST SINGLE VIEW  - Feb 01 2013 11:03AM     RESULT: Comparison is made to the study of September 09, 2012.    The lungs are well-expanded and clear. The cardiac silhouette is normal   in size. The mediastinum is normal in width. The pulmonary vascularity is   not engorged. There is no pneumothorax or pleural effusion or   pneumomediastinum. The bony thorax exhibits no acute abnormality.    IMPRESSION:  There isno acute cardiopulmonary abnormality.     Dictation Site: 5    Verified By: DAVID A. Martinique, M.D., MD  CT:    06-Apr-14 10:24, CT Abdomen and Pelvis Without Contrast  CT Abdomen and Pelvis Without Contrast   REASON FOR EXAM:    (1) abdomina pain- RLQ; (2) Abdominal pain RLQ  COMMENTS:       PROCEDURE: CT  - CT ABDOMEN AND PELVIS W0  - Feb 02 2013 10:24AM     RESULT: History: Pain.    Comparison Study: Prior CT of 10/10/2012.    Findings: Standard nonenhanced CT obtained. Diffuse fatty replacement of   the liver is noted. Indeterminate left hepatic lobe lesion is again noted   and unchanged from 10/10/2012. Gallbladder is contracted. Minimal   gallbladder wall thickening noted. No surrounding inflammatory change. No   stones noted. No biliary distention. Pancreas normal. Spleen normal.   Stomach nondistended. Adrenals normal. Tiny cyst right kidney, stable. No     hydronephrosis or hydroureter. No obstructing stone. Phleboliths.   Moderate bladderdistention noted. Cystocele is noted. Stable left   ovarian cyst. Calcification in uterus most likely from fibroids. Aorta   normal caliber. Appendix normal. No bowel distention. A mild infiltrates   in the lung bases. No free air.    IMPRESSION:   1. Mild infiltrates in the lung bases.  2. Moderate bladder distention. Small cystocele noted.  3. Stable left ovarian cyst.  4. Stable left hepatic lobe indeterminate lesion.        Verified By: Osa Craver, M.D., MD     No Known Allergies:   Vital Signs/Nurse's Notes:  **Vital Signs.:   07-Apr-14 07:00  Pulse Pulse 70  Respirations Respirations 25  Systolic BP Systolic BP 182  Diastolic BP (mmHg) Diastolic BP (mmHg) 993  Mean BP 120  Pulse Ox % Pulse Ox % 100  Pulse Ox Activity Level  At rest  Oxygen Delivery Room Air/ 21 %  Pulse Ox Heart Rate 70    Impression 39 year old female with a long hx of polysubstance abuse, presenting after ETOH intoxication, in state of delirium,  abdominal pain, not eating anything for the last 4 days, vomiting, shouting for help. ardiology has been consulted for prolonged QTc.  1) Prolonged QTc Mid[place] long on arrival, worse  now. Etiology is not clear as QTc can prolong with haldol, also sometimes with precedex. Confusing picture also in the setting of profoundly depressed potassium. --Would replete potassium, add po if patient will take it. --Minimize use of haldol if possible, perhaps change to ativan, morphine for ABD pain --If QT remains long after potassium up to high 3 level to 4, could add low dose b-blocker (metoprolol 12.5 BID to minimize risk of arrhythmia).  2) Polysubstance abuse: management per medical and psychiatry  3) Psych notes indicate hx of prior issues   Electronic Signatures: Ida Rogue (MD)  (Signed 07-Apr-14 09:40)  Authored: General Aspect/Present Illness, History and Physical Exam, Review of System, Health Issues, Home Medications, Labs, EKG , Radiology, Allergies, Vital Signs/Nurse's Notes, Impression/Plan   Last Updated: 07-Apr-14 09:40 by Ida Rogue (MD)

## 2015-02-19 NOTE — Consult Note (Signed)
Brief Consult Note: Diagnosis: Alcohol dependence, opioid dependence.   Patient was seen by consultant.   Consult note dictated.   Recommend further assessment or treatment.   Orders entered.   Comments: Ms. Viernes has a h/o alcoholism and narcotics dependence. She is asking for detox.  PLAN: 1. Will admit to psychiatry.  2. Continue CIWA.  Electronic Signatures: Orson Slick (MD)  (Signed 04-Aug-14 10:48)  Authored: Brief Consult Note   Last Updated: 04-Aug-14 10:48 by Orson Slick (MD)

## 2015-02-19 NOTE — H&P (Signed)
PATIENT NAME:  Sarah Serrano, Sarah Serrano MR#:  [id number] DATE OF BIRTH:  01-03-1976  DATE OF ADMISSION:  01/31/2013  REFERRING PHYSICIAN: Orlie Dakin, MD  PRIMARY CARE PHYSICIAN: Nonlocal.   CHIEF COMPLAINT: Delirium, vomiting, alcohol detoxification.   HISTORY OF PRESENT ILLNESS: A 39 year old female who is in the ER in delirium, and she only says that she was drinking for the last 4 to 5 days and started having abdominal pain, not eating anything for the last 4 days, vomiting and delirium, shouting for help. Not able to get any history from the patient. Whatever history I have I got from the previous records available in the system and ER physician. As per the lab results, she was found having severe acidosis, possibly due to starvation, and alkalosis due to vomiting. ER physician called psychiatry, but they wanted to admit to medicine for her acute medical issues. Once she recovers, she can be transferred for detox.   REVIEW OF SYSTEMS: Unable to get it.   PAST MEDICAL HISTORY: As per the old records, alcohol abuse, anxiety and depression, benign hypertension, history of hepatic steatosis, renal cyst, chronic anemia, chronic thrombocytopenia, severe acidosis requiring hemodialysis in the past, chronic electrolyte disorders.   HOME MEDICATIONS: Not available right now as patient is delirious and not able to give any history, and pharmacy technician also tried to get further information but could not.   ALLERGIES: No known drug allergies.   SOCIAL HISTORY: She smokes 1 pack per day, drinks alcohol regularly, has been drinking for the last 5 days continuously.   FAMILY HISTORY: Positive for alcohol abuse, otherwise unremarkable.  PHYSICAL EXAMINATION: VITALS: Temperature 97.9, pulse rate ranging from 120 to 130, respirations from 20 to 25, blood pressure 171/92 and pulse oximetry 97 on room air.  GENERAL: A 39 year old female who is in acute distress and anxious, and she just vomited before I  entered in the room, so her stretcher in the room is covered with vomitus which is dark color, but appears to be some secretions and some kind of particles.  HEAD AND NECK: Atraumatic. Conjunctivae pale. Oral mucosa dry. Neck supple. No JVD.  RESPIRATORY: Tachypnea, bilateral clear air entry.  CARDIOVASCULAR: S1, S2 present. Tachycardia. Not able to appreciate any murmur due to tachycardia.  ABDOMEN: Mild tenderness. Bowel sounds present. No mass.  SKIN: No rashes.  JOINTS: No edema or tenderness.  NEUROLOGICAL: She is moving all 4 limbs spontaneously. No tremors. No edema on the legs.  PSYCHIATRIC: She is very anxious and agitated and not oriented to time, place and person.   LABORATORY RESULTS: Glucose 70, BUN 8, creatinine 0.45, sodium 139, potassium 3.3, chloride 102, CO2 11, anion gap 26, osmolarity 274, calcium 8.2. Ethanol 294 at 7:00 a.m. in the morning. Total protein 8.4, albumin 3.5, bilirubin 0.6, alkaline phosphate 88, SGOT 100, SGPT 39. Thyroid stimulating hormone 0.20. Lithium less than 0.2. Urine for drugs positive for cannabinoids and for methadone. WBCs 18.3, hemoglobin 12.1, platelet count 637, MCV 109. Acetaminophen level less than 2. Salicylate level 2.3. Pregnancy test negative. ABG: pH 7.30, pCO2 20, pO2 82 on room air.   ASSESSMENT AND PLAN: A 39 year old female with chronic alcohol and drug abuse, who came to the ER after drinking for 4 to 5 days and having vomiting and is delirious right now.   1. Acid-base disorders. She is currently having metabolic acidosis, respiratory alkalosis and metabolic alkalosis, triple disorder, due to starvation ketosis possibly and due to vomiting and dehydration. Will give IV normal  saline bolus, ER is also giving fluids, and we will maintain after that on D5 normal saline at 150 mL/hour. Recheck electrolytes and acid base for correction in the evening and will make necessary changes. 2. Delirium tremens. CIWA protocol, Haldol. CCU monitoring  and psych for detox when medically stable.  3. Elevated white blood cells. Possibly this is due to delirium tremens and acidosis, but will check urinalysis and blood cultures.  4. Drug abuse, methadone and cannabinoids are positive in the urine. Will continue monitoring and supportive care in CCU and wait for the improvement.   CODE STATUS: Full code.   TOTAL TIME SPENT IN CRITICAL CARE: 60 minutes in the management.   ____________________________ Ceasar Lund. Anselm Jungling, MD vgv:OSi D: 01/31/2013 13:54:44 ET T: 01/31/2013 14:13:27 ET JOB#: [id number]  cc: Ceasar Lund. Anselm Jungling, MD, <Dictator> Vaughan Basta MD ELECTRONICALLY SIGNED 02/09/2013 22:21

## 2015-02-19 NOTE — H&P (Signed)
PATIENT NAME:  Sarah Serrano, Sarah Serrano MR#:  [id number] DATE OF BIRTH:  Jun 11, 1976  DATE OF ADMISSION:  02/12/2013  REFERRING PHYSICIAN:  Dr. Lavonia Drafts.   PRIMARY CARE PHYSICIAN:  None.   CHIEF COMPLAINT:  Generalized body aches.   HISTORY OF PRESENT ILLNESS:  The patient is a 39 year old with history of heavy alcohol use, polysubstance abuse presented to the Emergency Department with complaints of generalized body aches.  The patient was found to have alcohol level of 335.  Also was found to have multiple lab abnormalities like elevated white blood cell count of 17,000, low bicarb and a pH of 7.3.  The decision was made to observe the patient.  The patient states has been experiencing generalized body aches for the last 3 to 4 days associated with some subjective fevers.  Denies having any cough, shortness of breath.  States has mild nausea.  States has some diarrhea.  Denies any dysuria.  The patient had a recent admission on January 31, 2013.  At that time, the patient was treated for delirium tremens and was also treated for the electrolyte imbalances.  The patient was extremely narcotic-seeking.  Concerning this, psychiatry was consulted for polysubstance dependence as well as alcohol detoxification.  Recommended to avoid any benzodiazepines or opiates, unless there is a strong indication.  The patient has been requesting in the Emergency Department and has been yelling loudly if patient is not given any narcotic medications.   PAST MEDICAL HISTORY:  1.  Alcohol dependence.  2.  Benzodiazepine dependence.  3.  Opiate dependence.  4.  Anxiety, depression.  5.  Hypertension.  6.  Hepatic steatosis. Marland Kitchen  8.  Chronic anemia.  9.  Chronic thrombocytopenia.  10.  History of acute severe acidosis requiring hemodialysis.  11.  Chronic electrolyte disorder.   ALLERGIES:  No known drug allergies.   HOME MEDICATIONS:  None.   SOCIAL HISTORY:  Continues to smoke 1 to 2 packs a day.  Drinks 1 to 2 pints  a day.  Denied using any illicit drugs.  Lives by herself.   FAMILY HISTORY:  Positive for alcoholic abuse.   REVIEW OF SYSTEMS: CONSTITUTIONAL:  Generalized body aches.  EYES:  No change in vision.  No drainage.  EARS, NOSE, THROAT:  No sore throat, runny nose.   RESPIRATORY:  Has mild cough.  No shortness of breath.  CARDIOVASCULAR:  No chest pain, shortness of breath.  No pedal edema.  GASTROINTESTINAL:  Has nausea, diarrhea.  GENITOURINARY:  Denies any dysuria or hematuria.  SKIN:  No rash or lesions.  MUSCULOSKELETAL:  Generalized body aches.  ENDOCRINE:  No polyuria or polydipsia.  NEUROLOGIC:  Has no weakness or numbness.   PHYSICAL EXAMINATION:  GENERAL:  This is a well-built, well-nourished, age-appropriate female lying down in the bed, not in distress.  VITAL SIGNS:  Temperature 97.3, pulse 105, blood pressure 173/100 and respiratory rate of 16, oxygen saturation 100% on room air.  HEENT:  Head normocephalic, atraumatic.  Eyes, no scleral icterus.  Conjunctivae normal.  Pupils equal and react to light.  Mucous membranes dry.  NECK:  Supple.  No lymphadenopathy.  No JVD.  No carotid bruit.  CHEST:  Has no focal tenderness. LUNGS:  Bilateral clear to auscultation.  HEART:  S1, S2 regular.  No murmurs are heard.  ABDOMEN:  Bowel sounds plus, soft, mild tenderness in the epigastric area.  No rebound or guarding.  No hepatosplenomegaly. MUSCULOSKELETAL:  Good range of motion in all the extremities.  LYMPHATIC:  No neck, axillary or inguinal lymphadenopathy.  SKIN:  No rash or lesions.  NEUROLOGIC:  The patient is alert, oriented to place, person and time.  Cranial nerves II through XII intact.  Motor 5 by 5 in upper and lower extremities.  No sensory deficits.   LABORATORY DATA:  ABG, pH of 7.30, pCO2 of 31.  UA negative for nitrites and leukocyte esterase.  CBC, WBC of 17,000, hemoglobin 11.3, platelet count of 629, MCV 110.  Complete metabolic panel, BUN 2, creatinine of 0.23,  bicarbonate 17, AST 40.  The rest of all the values are within normal limits.   ASSESSMENT AND PLAN:  The patient is a 39 year old African American female comes to the Emergency Department with alcohol intoxication and was found to have multiple lab abnormalities.  1.  Alcohol intoxication, alcohol level of 335.  Give the patient thiamine.  Give the patient D5 half-normal saline.  2.  Metabolic acidosis.  This is a combination of alcohol and starvation ketoacidosis.  Continue with D5 half-normal saline.  We will also check ethylene glycol and methanol.  The patient's kidney function is well within normal limits.  Continue with IV fluids.  3.  Hypertension.  The patient had delirium tremens during the previous admission.  We will admit the patient to the monitored bed and continue to follow up with the patient.  4.  Generalized body aches.  We will check the CPK level.  Continue with IV fluids.  We will avoid any IV narcotic medications.  We will keep the patient on Norco and Tylenol as needed.  5.  Alcohol abuse.  We will need further counseling with the patient.  The patient seems to have poor insight.  6.  Tobacco use, counseled, stated that has not smoked in the last 1 day.  7.  Keep the patient on deep vein thrombosis prophylaxis with Lovenox.   TIME SPENT:  50 minutes.    ____________________________ Monica Becton, MD pv:ea D: 02/13/2013 05:15:54 ET T: 02/13/2013 06:01:56 ET JOB#: [id number]  cc: Monica Becton, MD, <Dictator> Monica Becton MD ELECTRONICALLY SIGNED 02/15/2013 7:23
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
## 2015-02-20 NOTE — Discharge Summary (Signed)
PATIENT NAME:  Sarah Serrano, Sarah Serrano MR#:  [id number] DATE OF BIRTH:  1976-02-07  DATE OF ADMISSION:  11/20/2013 DATE OF DISCHARGE:  11/23/2013  DISCHARGE DIAGNOSES: 1.  Alcohol abuse.  2.  Polysubstance abuse including marijuana, opioids, tobacco, alcohol.  3.  Elevated blood pressure without diagnosis of hypertension.  4.  Noncompliance.  5.  Reactive leukocytosis.  6.  Delirium tremens.  7.  Hypokalemia.  8.  Hypomagnesemia.   CONSULTS:  Dr. Bary Leriche of psychiatry.   IMAGING STUDIES:  1.  CT head without contrast showed no acute abnormalities.  2.  Chest x-ray showed no acute cardiopulmonary disease.   ADMITTING HISTORY AND PHYSICAL AND HOSPITAL COURSE: Please see detailed H and P dictated by Dr. Bridgette Habermann. In brief, a 39 year old African American female patient with multiple prior admissions with altered mental status from alcohol withdrawal, presented to the hospital with altered mental status and agitation. The patient was found to have alcohol levels greater than 300 and polysubstance abuse with marijuana and opioids positive. The patient also has history of overdose on her medications in the past. The patient was admitted to ICU, started on Precedex drip along with Bentyl has been scheduled and p.r.n. to prevent any withdrawal. The patient did have delirium tremens during the hospital stay, was seen by psychiatry. By the day of discharge, the patient is doing well on oral scheduled Librium, has requested to be discharged home. She did threaten that if not discharged the patient would leave against medical advise. The patient is afebrile. Blood pressure normal, heart rate at 82 and optimized for discharge.   Today on examination, the patient is alert and oriented x 3. No tremors. Lungs sound clear.   The patient did have elevated blood pressure from her alcohol withdrawal which has resolved. Had hypokalemia and hypomagnesemia which were replaced both orally and IV. Was seen by psychiatry  during the hospital stay.   DISCHARGE MEDICATIONS: Include:  1.  Librium 25 mg 2 times a day for 4 more days.  2.  Acetaminophen 325 mg 2 tablets every 4 hours as needed for pain or temperature.  3.  Trazodone 100 mg oral once a day.  4.  Seroquel 300 mg oral once a day.   DISCHARGE INSTRUCTIONS: Regular food. Activity as tolerated. Follow up with primary care physician in 1 to 2 weeks. The patient has been counseled extensively to quit alcohol. I have counseled her on the day of discharge for greater than 3 minutes to quit smoking. She does verbalize understanding.   TIME SPENT ON DAY OF DISCHARGE IN DISCHARGE ACTIVITY: Was 35 minutes.   ____________________________ Leia Alf Rossie Scarfone, MD srs:cs D: 11/23/2013 15:32:00 ET T: 11/23/2013 20:18:51 ET JOB#: [id number]  cc: Alveta Heimlich R. Riely Oetken, MD, <Dictator> Neita Carp MD ELECTRONICALLY SIGNED 11/25/2013 10:21

## 2015-02-20 NOTE — Consult Note (Signed)
Brief Consult Note: Diagnosis: Alcohol dependence, opioid dependence.   Patient was seen by consultant.   Consult note dictated.   Recommend further assessment or treatment.   Orders entered.   Comments: Sarah Serrano has a h/o alcoholism and narcotics dependence. She was unable to provide abny information on admission. She is still too confused for interview. She insists that she was in Methadone clinic in [place] and was receiving 80-90 mg/daily. She reportedly was given one week supply to last her during her trip to [facility] where she relapsed on alcohol and drugs.   PLAN: 1. Please continue CIWA.  2. The patient unable to provide details enough to identify methadone program she participated in. She is positive for opioids on admission.   3. I will follow along.  Electronic Signatures: Orson Slick (MD)  (Signed 23-Jan-15 19:30)  Authored: Brief Consult Note   Last Updated: 23-Jan-15 19:30 by Orson Slick (MD)

## 2015-02-20 NOTE — Consult Note (Signed)
Brief Consult Note: Diagnosis: Alcohol dependence, opioid dependence, substance withdrawal delirium resolved.   Patient was seen by consultant.   Consult note dictated.   Recommend further assessment or treatment.   Comments: Sarah Serrano has a h/o alcoholism and narcotics dependence. She was admitted to CCU for AMS with BAL >300.   The patient is alert and fully oriented. She is not suicidal, homicidal or psychotic. She looks so good only becasue she is still on Librium taper.   PLAN: 1. The patient does not meet criteria for IVC. PLease discharge as appropriate.   2. We recommend that the patient complets detox protocol.   3. She is connected and will follow up with [facility] and their community support team.  4. She declines treatment at [place] re[facility] facility.   5. I will sign off.  Electronic Signatures: Orson Slick (MD)  (Signed 25-Jan-15 12:46)  Authored: Brief Consult Note   Last Updated: 25-Jan-15 12:46 by Orson Slick (MD)

## 2015-02-20 NOTE — Consult Note (Signed)
Brief Consult Note: Diagnosis: Alcohol dependence, opioid dependence, substance withdrawal delirium.   Patient was seen by consultant.   Consult note dictated.   Recommend further assessment or treatment.   Orders entered.   Comments: Ms. Kaus has a h/o alcoholism and narcotics dependence. She was admitted to CCU for AMS with BAL >300. She is alert and oriented x3. She is sedated but able to participate in interview. She regrets relapse after 8 months of sobriety but she already declines treatment at [facility] re[facility] facility.   PLAN: 1. Please continue CIWA.  2. The patient unable to provide details enough to identify methadone program she participated in. She is positive for opioids on admission.   3. I will follow along.  Electronic Signatures: Orson Slick (MD)  (Signed 24-Jan-15 18:21)  Authored: Brief Consult Note   Last Updated: 24-Jan-15 18:21 by Orson Slick (MD)

## 2015-02-20 NOTE — H&P (Signed)
PATIENT NAME:  Sarah Serrano, Sarah Serrano MR#:  [id number] DATE OF BIRTH:  1976/09/27  DATE OF ADMISSION:  11/20/2013  REFERRING PHYSICIAN:  Dr. Jasmine December.   CHIEF COMPLAINT:  Brought in for altered mental status, agitation.   HISTORY OF PRESENT ILLNESS:  The patient is a 39 year old African American female known to me from the past. She has a history of significant DTs, significant alcohol abuse, hepatic steatosis and hospitalizations for DTs in the past. The patient currently has received multiple doses of Ativan, Narcan, Haldol and is sleepy. The patient is moving her extremities spontaneously but unable to provide any history. The history is obtained from the ER staff, physician and her nurses. Apparently the patient called EMS herself and had told them that she had been using heroin after the door was busted as she, after calling EMS, was not opening her door. So they broke down her door and found her unresponsive. There she was noted to have low a blood sugar, and she was given some glucagon. Initially, apparently they could not give her IV Narcan, but did give her some intranasal Narcan. Per ER physician, her house was filled with drug paraphernalia. She was brought here where she was very agitated, scratching the staff, tachypnea and hypothermic. She was given a nub of D50, started on D5W and has received multiple doses of Ativan for suspected DTs. She had been confused, incoherent and per ER physician on arrival was tachycardic, very tremulous. She underwent a CT of the head, which is degraded by motion artifact, but there is no acute intracranial process. She had a temperature 95.7 and U-tox was positive for cannabinoids and opiates. The patient also has significant acidosis with an ABG pH of 7.23 with lactic acid of 4. Hospitalist services were contacted for further evaluation and management.   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   ALLERGIES:  Per chart:  No known drug allergies.   SOCIAL HISTORY:  Per chart, a smoker, drinks regularly, and per her admission through the  ER staff had used heroin and was found with a significant amount of drug paraphernalia per ER physician.   FAMILY HISTORY:  Per chart, positive alcohol abuse.  REVIEW OF SYSTEMS:  Unable to obtain as the patient is nonverbal at this time.   OUTPATIENT MEDICATIONS:  Unable to obtain at this point, but of note, the patient was last discharged from [place] in April of last year, but no medications are listed.  PHYSICAL EXAMINATION: GENERAL:  We have an [place] American female lying in bed, moving all extremities spontaneously but really unresponsive.  HEENT:  Pupils are pinpoint. Normocephalic, atraumatic. Pupils are minimally reactive. Dry mucous membranes.  NECK:  Supple.  CARDIAC:  Tachycardic. S1, S2 is irregular. RESPIRATORY:  Good respiratory effort.  LUNGS:  Appear to be clear.  ABDOMEN:  Soft, nontender, nondistended. Positive bowel sounds in all quadrants.  EXTREMITIES:  No pitting edema. NEUROLOGIC:  Unable to do a full neuro exam but the patient withdraws on all extremities. The patient also spontaneously moves all extremities.  PSYCHIATRIC:  Unable to do.  SKIN:  No obvious rashes or lesions.   LABORATORIES:  Initial glucose 47, BUN 8, creatinine 0.54, sodium 140, potassium 3, a serum CO2 of 17, alcohol level below detection.  LFTs show total protein of 9, otherwise within normal limits. TSH is 0.38. U-tox positive for cannabinoids and opiates. White count 27.4. Urinalysis does not suggest an infection. Serum salicylates 7.9. A pH 7.23, pCO2 of 32, pO2 of 155 with lactic acid of 4.   X-ray of the chest:  No acute abnormalities.   ASSESSMENT AND PLAN:  We  have a 39 year old female with a history of delirium tremens in the past, regular heavy alcohol abuse in the past, anxiety, depression, severe acidosis in the past requiring dialysis and chronic electrolytes disorders likely secondary to her alcohol abuse. Was by brought in after she called EMS herself, with heroin use and here appears to be in severe delirium tremens. The patient has no alcohol in her blood at this point, but was tremulous, tachycardic and incoherent. She will be admitted to the Critical Care Unit. We would start the patient on CIWA protocol and will have a low threshold to start her on Precedex. The patient received multiple doses of 12 mg of Ativan in addition to Haldol and Narcan. Currently, she is protecting her airway. I will start her on a banana bag daily, obtain a sitter and a Psych consult. We would monitor her carefully as she has significant risk of severe DTs. Once she is more awake, we would be able to do a more thorough on neurological exam. The patient does have significant systemic anti-inflammatory response syndrome criteria with tachycardia, leukocytosis and mild hyperthermia. Symptomatically if these are all reactive or the patient has an infection as patient has been injecting heroin, she is at high risk of developing blood infections, i.e. bacteremia. We would start her on vancomycin at this point prophylactically as blood cultures come back and that could be stopped if the blood cultures are negative in 48 hours or so. She furthermore, is severely acidotic. I suspect from her alcohol abuse. She has a lactate of 4. We would start the patient on aggressive IV fluid resuscitation. In regards to the Systemic inflammatory response syndrome criteria, the patient does have clear urinalysis, clear x-ray of the chest and she has no obvious rashes. I doubt meningitis as the neck is supple and her presentation is more consistent with substance abuse and delirium tremens. In regards to  her hypoglycemia, she has been maintained on D5W at this point and sugars were in the 200s. I suspect her encephalopathy is secondary to metabolic encephalopathies/alcohol/polysubstance abuse. I would start her on heparin for deep vein thrombosis prophylaxis.   CODE STATUS:  The patient is FULL CODE at this point.  CRITICAL CARE TIME OF ABOUT:  50 minutes.    ____________________________ Vivien Presto, MD sa:jm D: 11/20/2013 15:13:57 ET T: 11/20/2013 15:49:49 ET JOB#: [id number]  cc: Vivien Presto, MD, <Dictator> Vivien Presto MD ELECTRONICALLY SIGNED 12/13/2013 10:52

## 2015-02-21 NOTE — Consult Note (Signed)
PATIENT NAME:  Sarah Serrano, Sarah Serrano MR#:  [id number] DATE OF BIRTH:  29-Jan-1976  DATE OF CONSULTATION:  02/26/2012  REFERRING PHYSICIAN:  Dr. Cephus Shelling  CONSULTING PHYSICIAN:  Jill Side, MD  REASON FOR CONSULTATION: Abdominal pain.   HISTORY OF PRESENT ILLNESS: 39 year old female with history of alcohol abuse, narcotic seeking behavior. Patient was admitted with uncontrolled hypertension and acidosis and was later transferred to psychiatric unit for detoxification. I was called by the nurse with consultation yesterday because of abdominal pain. Apparently patient has been complaining of abdominal pain for quite some time and there is some evidence of narcotic seeking behavior. A CT scan of the abdomen done a few days ago showed questionable mild colitis on the right side but was otherwise unremarkable. Patient was examined yesterday. She is basically complaining of pain in the right lower quadrant to right lower back area. She denies any diarrhea or constipation. She had no vomiting but was complaining of some nausea. No fever or chills have been reported. It is not clear exactly how long the patient is having pain although according to her the pain became severe yesterday; prior to that it appears that patient has chronic abdominal pain, mostly on the right side.   PAST MEDICAL HISTORY:  1. History of alcohol abuse. 2. Depression.   ALLERGIES: None.   HOME MEDICATIONS:  1. Seroquel. 2. Celexa. 3. Clonidine.   FAMILY HISTORY: Positive for alcohol addiction.   PAST SURGICAL HISTORY: Tubal ligation.   REVIEW OF SYSTEMS: Grossly negative except for what is mentioned in the History of Present Illness.   PHYSICAL EXAMINATION:  GENERAL: Well built female. She does not appear to be in any acute distress, quite awake, alert, and oriented. She is ambulatory.   VITAL SIGNS: Temperature 97.4, pulse 90, respirations 18, blood pressure 106/75.   HEENT: Appears to be unremarkable.   LUNGS:  Grossly clear to auscultation bilaterally.   CARDIOVASCULAR: Regular rate and rhythm. No gallops or murmur.   ABDOMEN: Fairly benign abdomen which is soft. No significant tenderness was noted even though she was complaining of severe pain in the right lower quadrant area. There is no rebound or guarding. Bowel sounds are fairly normal.   NEUROLOGIC: Examination appears to be unremarkable.   LABORATORY, DIAGNOSTIC, AND RADIOLOGICAL DATA: Her white cell count is 6.8, hemoglobin 11, hematocrit 33, platelet count 394. Electrolytes: BUN and creatinine are quite normal. A repeat CT scan was requested which was done last night. The repeat CT scan is normal. No colitis or appendicitis was noted.  ASSESSMENT AND RECOMMENDATIONS: As of today I was called by Dr. Cephus Shelling that patient is feeling better and she wants to go home. Patient has been evaluated by medical physician and has been cleared for discharge. Most likely her right lower quadrant abdominal pain is either musculoskeletal in origin or is functional. No significant abnormalities have been noted on blood work-up or CT scan. Patient was started on Cipro and Flagyl earlier for suspected colitis which I will continue for not more than a total of a week. Patient has been suggested to follow up with me as outpatient if desired or return to the Emergency Room in case of significant abdominal pain. Plan was discussed with Dr. Cephus Shelling this afternoon. Patient is being discharged. Will sign off. Please do not hesitate to call me if I can be of any further assistance.  ____________________________ Jill Side, MD si:cms D: 02/26/2012 18:47:40 ET T: 02/27/2012 08:05:41 ET  JOB#: [id number] cc: Earlyne Iba  Dionne Milo, MD, <Dictator> Jill Side MD ELECTRONICALLY SIGNED 02/27/2012 20:50

## 2015-02-21 NOTE — Consult Note (Signed)
Brief Consult Note: Diagnosis: RLQ pain.   Patient was seen by consultant.   Consult note dictated.   Recommend further assessment or treatment.   Orders entered.   Comments: 24 hrs rlq pain, nausea no emesis. no prior episode. ate lunch. PE rlq tenderness, guarding. will get Ct of abd pelvis (nml creat).  Electronic Signatures: Florene Glen (MD)  (Signed 28-Apr-13 17:20)  Authored: Brief Consult Note   Last Updated: 28-Apr-13 17:20 by Florene Glen (MD)

## 2015-02-21 NOTE — Consult Note (Signed)
PATIENT NAME:  Sarah Serrano, Sarah Serrano MR#:  [id number] DATE OF BIRTH:  [birth year]  DATE OF CONSULTATION:  02/25/2012  CONSULTING PHYSICIAN:  Jerrol Banana. Burt Knack, MD  CHIEF COMPLAINT: Right lower quadrant pain.   HISTORY OF PRESENT ILLNESS: This patient is in the hospital and has been so for approximately a week with a history of severe alcohol abuse presenting with acidosis. She is in the hospital for detox and for treatment of her acidosis, which has resolved. She was originally requiring dialysis upon her initial admission. That has all resolved.   The patient states that in the last 24 hours she has had the acute onset of right lower quadrant pain. It was never periumbilical. It has not migrated. It started in the right lower quadrant, came on all of a sudden and is quite severe. She has had some nausea but no emesis, and she states that she has been taking antinausea medications which have helped considerably. She has not had a bowel movement in two days, has no diarrhea. No fevers or chills. She ate a normal lunch, although she did not have much of an appetite and did not eat all of her lunch. She has never had an episode like this before.   PAST MEDICAL HISTORY:  1. Hypertension. 2. Depression. 3. Alcohol abuse.  4. Additionally, she has had this recent history of acidosis requiring dialysis. See the chart for details during this hospitalization.   PAST SURGICAL HISTORY: She denies any abdominal surgery.   SOCIAL HISTORY: The patient smokes tobacco. She drinks alcohol, sometimes heavily.   ALLERGIES: None.   MEDICATIONS: Seroquel and others during this hospitalization, see chart.   FAMILY HISTORY: Noncontributory.   REVIEW OF SYSTEMS: A 10-system review was performed and negative with the exception of that mentioned in the history of present illness. She denies hematuria or dysuria.   PHYSICAL EXAMINATION:  GENERAL: An uncomfortable-appearing black female patient. She walks down the hall  holding her right side and limping.   VITAL SIGNS: Temperature is 98.1, pulse 90, respirations 18, blood pressure 92/57. There is only one data point on blood pressure in the last several days, and that was this morning at 06:00 hours.   HEENT: No scleral icterus.   NECK: No palpable neck nodes.   CHEST: Clear to auscultation.   CARDIAC: Regular rate and rhythm.   ABDOMEN: Abdomen is showing some guarding in the right lower quadrant. No scars are noted. There is minimal percussion tenderness in both lower quadrants, more on the right side. There is no rebound tenderness, and the area of maximal tenderness is at McBurney's point.   EXTREMITIES: Extremities are without edema. Calves are nontender.   NEUROLOGIC: Grossly intact.   INTEGUMENT: No jaundice.  LABORATORY, DIAGNOSTIC AND RADIOLOGICAL DATA:  Chemistries done this morning at 5:00 a.m. demonstrate a creatinine of 0.72, BUN of 5, electrolytes are otherwise normal.  CBC done at the same time demonstrates a white blood cell count of 7.1, hemoglobin and hematocrit of 10 and 30, and a platelet count of 375.  A urinalysis is essentially clean.  An abdominal series, flat and erect, has been performed and shows some air-fluid levels but no free air.   ASSESSMENT AND PLAN: This is a patient with the acute onset of right lower quadrant pain following a significant and severe hospitalization for acidosis and detox subsequent to alcohol abuse. The acute onset of this pain and the fact that it is in the right lower quadrant suggests that this could  be appendicitis, although her white blood cell count is normal, and she has no fevers or chills. My recommendations would be to obtain a CT scan. Orders have been entered for that CT scan to be done this evening, and I will sign her out to Dr. Andree Elk, who is coming on shift soon. If she requires surgery due to appendicitis, that will be performed later tonight. This was all discussed with the patient. She  understood and agreed with this plan, and I have made her n.p.o. other than her CT contrast.   ____________________________ Jerrol Banana. Burt Knack, MD rec:cbb D: 02/25/2012 17:42:08 ET T: 02/26/2012 10:31:48 ET JOB#: [id number]  cc: Jerrol Banana. Burt Knack, MD, <Dictator> Florene Glen MD ELECTRONICALLY SIGNED 02/26/2012 12:25

## 2015-02-21 NOTE — Consult Note (Signed)
PATIENT NAME:  Sarah Serrano, SANABIA MR#:  [id number] DATE OF BIRTH:  [birth year]  DATE OF CONSULTATION:  02/25/2012  REFERRING PHYSICIAN:  Alethia Berthold, MD CONSULTING PHYSICIAN:  Kyshaun Barnette, MD  PRIMARY CARE PHYSICIAN: The patient will be establishing with the [facility].   REASON FOR CONSULTATION: Severe abdominal pain.  HISTORY OF PRESENT ILLNESS: [name] is a 39 year old woman with history of alcohol abuse and depression who initially presented on 02/16/2012 and admitted to the medical service for management of alcohol withdrawal, uncontrolled blood pressure, and severe acidosis. She was transferred to the psychiatric unit on 02/20/2012. Medicine continued to follow for management of her medical issues including high blood pressure and anemia as well as abdominal pain.  The patient had been pending for discharge and Medicine had signed off. However, we were reconsulted this evening with reports of developing severe onset of abdominal pain not relieved with Toradol. She denies any chest pain. She felt short of breath with her pain. No nausea or vomiting, no diarrhea, no fevers.  PAST MEDICAL HISTORY:  1. Alcohol abuse. 2. Hypertension, likely in the setting of withdrawals. 3. Depression. 4. Hepatosteatosis. 5. Renal cyst. 6. Anemia, thrombocytopenia.   PAST SURGICAL HISTORY: Bilateral tubal ligation.   DRUG ALLERGIES: No known drug allergies.   CURRENT MEDICATIONS: 1. Antacid double-strength suspension 30 mL every 4 hours as needed. 2. Clonidine 0.1 mg every 3 hours as needed. 3. Milk of Magnesia 30 mL at bedtime as needed. 4. Protonix 40 mg twice a day. 5. Phenergan suppository 25 mg every 4 hours as needed. 6. Phenergan 25 mg p.o. every 4 hours as needed. 7. Seroquel 50 mg twice a day as needed. 8. Nicotine patch 14 mg daily. 9. Seroquel 400 mg at bedtime.  10. Thiamine 50 mg daily.  11. Ativan 0.5 mg twice a day. 12. Nicotrol inhaler one cartridge every 2 hours  as needed. 13. Ciprofloxacin 250 mg twice a day. 14. Celexa 40 mg daily. 15. Tramadol 50 mg every 6 hours as needed.  FAMILY HISTORY:  Alcohol abuse.  SOCIAL HISTORY: She drinks 7 to 10 alcoholic drinks per day. Positive tobacco. No drug use.   REVIEW OF SYSTEMS: CONSTITUTIONAL: No fevers, nausea or vomiting. EYES: No glaucoma or cataracts. ENT: No epistaxis. RESPIRATORY: Shortness of breath with the pain. CARDIOVASCULAR: No chest pain or syncope. GASTROINTESTINAL: Abdominal pain. No bright red blood per rectum or hematemesis. GENITOURINARY: No dysuria or hematuria. ENDOCRINE: No polyuria or polydipsia. HEME: No bleeding. SKIN: No ulcers. MUSCULOSKELETAL: No joint swelling.  NEURO: No history of stroke or seizures. PSYCH: History of depression. No suicidal ideation.   PHYSICAL EXAMINATION:   VITALS: Temperature 96, pulse 100, respiratory rate 20, blood pressure 135/90, and saturating 97% on room air.  GENERAL: Lying in bed in discomfort.  HEENT: Normocephalic, atraumatic. Pupils equal and symmetric. Anicteric. Nares without discharge. Moist mucous membranes.  NECK: Soft and supple. No adenopathy or JVD.   HEART: Slightly tachy. No murmurs, rubs, or gallops.  LUNGS: Clear to auscultation bilaterally. No use of accessory muscles or increased respiratory effort.   ABDOMEN: Positive tenderness mainly in the right lower quadrant to the right upper quadrant. No rebound or guarding. She has hyperactive bowel sounds. No mass appreciated.   EXTREMITIES: No edema. Dorsal pedal pulses intact.   MUSCULOSKELETAL: No joint effusion.  SKIN: No ulcers.  NEURO: No dysarthria or aphasia.    PSYCH: Alert and oriented. The patient is cooperative.  PERTINENT LABS/STUDIES: From 02/23/2012: Occult stool negative.  From 02/22/2012: WBC 8.3, hemoglobin 10.7, hematocrit 31.5, platelets 231, and MCV 108.   From 02/21/2012: Urinalysis with specific gravity of 1.017, blood 5, positive nitrite, 3+ leukocyte  esterase, RBC 8 per high-power field, and WBC 166 per high-power field.   From 02/20/2012: Glucose 93, BUN 4, creatinine 0.5, sodium 142, potassium 4, chloride 111, carbon dioxide 22, and calcium 8.8. AST 55, ALT 39, alkaline phosphatase 64, and total bilirubin 0.5. Magnesium 1.2 on 02/17/2012.    CT of the abdomen and pelvis with contrast on 02/16/2012 revealing for questionable mild colitis but this certainly may be an artifact given the decompressed nature of the colon. There is severe fatty infiltration of the liver with possible posterior left lobe hemangioma versus other etiology such soft tissue. Possible distal esophagitis versus minimal sliding type hiatal hernia. Left ovarian cyst. 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 There is a tiny right renal cyst.   Lipase 108 on 02/16/2012.   ASSESSMENT AND PLAN: [name] is a 39 year old woman with history of alcohol abuse, depression, hypertension, likely withdrawal, and hepatosteatosis transferred from the Medicine service to the Behavior Medicine service for detoxification of alcohol and Medicine reconsulted for evaluation of abdominal pain. 1. Abdominal pain, of questionable etiology, questionable in the setting of UTI versus fatty liver versus colitis. In review of the CT of the abdomen and pelvis, results as above. Cannot rule out possible colitis. We will increase her Cipro and add Flagyl. No recurrent nausea or vomiting, and no diarrhea. She has hyperactive bowel sounds on examination. We will get an abdominal film, also get GI consultation for further recommendations. Continue with symptoms control. Repeat UA and urine culture as her urine culture was not sent with the original UA. We will send CBC and CMP. Her original lipase was within normal limits.  2. Hypomagnesemia. She received five days of replacement. We will repeat her magnesium level. 3. Hypertension, likely in the setting of withdrawals plus/minus pain.  Now controlled. She is on clonidine as needed.  4. Anemia and thrombocytopenia. Stable as above. Repeat CBC. This is likely related to alcohol. Her iron studies are unrevealing. Folate and B-12 within normal limits. She will needed continued followup upon discharge.  5. Alcohol abuse and depression. Per psych. 6. Questionable esophagitis on CT scan. Continue Protonix twice a day.  TIME SPENT: Approximately 50 minutes was spent on patient care.  ____________________________ Rita Ohara, MD ap:slb D: 02/25/2012 01:42:00 ET T: 02/25/2012 10:57:23 ET JOB#: [id number]  cc: Brien Few Ghina Bittinger, MD, <Dictator> Gonzella Lex, MD Rita Ohara MD ELECTRONICALLY SIGNED 02/28/2012 22:27

## 2015-02-21 NOTE — Consult Note (Signed)
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  Needs urgent HD.  HD placed at bedside  Electronic Signatures: Algernon Huxley (MD)  (Signed on 19-Apr-13 12:37)  Authored  Last Updated: 19-Apr-13 12:37 by Algernon Huxley (MD)
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
## 2015-02-21 NOTE — Op Note (Signed)
PATIENT NAME:  Sarah Serrano, Sarah Serrano MR#:  [id number] DATE OF BIRTH:  [birth year]  DATE OF PROCEDURE:  02/16/2012  PREOPERATIVE DIAGNOSES:  1. Lack of appropriate IV access.  2. Metabolic acidosis.  3. Possible methanol poisoning.   POSTOPERATIVE DIAGNOSES:  1. Lack of appropriate IV access.  2. Metabolic acidosis.  3. Possible methanol poisoning.   PROCEDURE PERFORMED: Insertion left IJ triple lumen catheter.   SURGEON: Eloise Levels, M.D.   DESCRIPTION OF PROCEDURE: The patient is in the Intensive Care Unit. She is positioned supine. The left neck is prepped and draped in sterile fashion. Ultrasound is placed in a sterile sleeve. Ultrasound is utilized secondary to lack of appropriate landmarks to avoid vascular, injury. Jugular vein is identified. It is echolucent, homogeneous, and easily compressible indicating patency. Under direct ultrasound visualization after recording an image, the Seldinger needle is inserted into the jugular vein. J-wire is advanced, dilator followed by triple-lumen catheter. All three lumens aspirate and flush easily and the catheter is secured to the skin of the neck with 2-0 silk and a sterile dressing is applied. The patient tolerated the procedure well. There were no immediate complications.   ____________________________ Katha Cabal, MD ggs:bjt D: 02/16/2012 17:10:47 ET T: 02/17/2012 10:43:59 ET JOB#: [id number]  cc: Katha Cabal, MD, <Dictator> Katha Cabal MD ELECTRONICALLY SIGNED 02/18/2012 13:55

## 2015-02-21 NOTE — Op Note (Signed)
PATIENT NAME:  Sarah Serrano, Sarah Serrano MR#:  [id number] DATE OF BIRTH:  1976/02/20  DATE OF PROCEDURE:  02/16/2012  PREOPERATIVE DIAGNOSIS: Severe acidosis with possible methanol poisoning.   POSTOPERATIVE DIAGNOSIS: Severe acidosis with possible methanol poisoning.   PROCEDURE PERFORMED:   1. Ultrasound guidance for vascular access, right femoral vein.  2. Placement of right femoral DuoGlide dialysis catheter.   SURGEON: Algernon Huxley, MD   ANESTHESIA: Local.   ESTIMATED BLOOD LOSS: 20 to 25 mL.  INDICATIONS FOR PROCEDURE: The patient is a 39 year old African American female who was admitted with alcohol intoxication and severe acidosis. There is fear of methanol poisoning. She has a marked level of acidosis, and dialysis is planned for her acidosis and possible methanol poisoning. This will be initiated immediately, and a dialysis catheter is necessary.   DESCRIPTION OF PROCEDURE: The patient was laid flat in the Critical Care bed. The right groin is sterilely prepped and draped, and a sterile surgical field was created. The right femoral vein was visualized with ultrasound and found to be patent. It was then accessed under direct ultrasound guidance without difficulty with a Seldinger needle. A J-wire was placed after skin nick and dilatation. The DuoGlide dialysis catheter 30 cm in length was placed over the wire, and the wire was removed. Both lumens withdrew blood well and flushed easily with sterile saline. It was secured to the skin at 29 cm with 3 nylon sutures.  ____________________________ Algernon Huxley, MD jsd:cbb D: 02/16/2012 14:35:40 ET T: 02/16/2012 18:30:28 ET JOB#: [id number]  cc: Algernon Huxley, MD, <Dictator> Algernon Huxley MD ELECTRONICALLY SIGNED 02/19/2012 9:55

## 2015-02-21 NOTE — Consult Note (Signed)
Brief Consult Note: Diagnosis: Abdominal pain.   Patient was seen by consultant.   Comments: Patient with severe RLA abdominal pain and tenderness. CT one week ago with ? colitis. No fever or leucocytosis. Due to the severity of pain I would repeat the CT scan. Further recommendations to follow.  Electronic Signatures: Jill Side (MD)  (Signed 28-Apr-13 16:34)  Authored: Brief Consult Note   Last Updated: 28-Apr-13 16:34 by Jill Side (MD)

## 2015-02-21 NOTE — Consult Note (Signed)
PATIENT NAME:  Sarah, Serrano MR#:  [id number] DATE OF BIRTH:  [birth year]  DATE OF CONSULTATION:  02/16/2012  REFERRING PHYSICIAN:  Lala Lund, MD CONSULTING PHYSICIAN:  Sinai Mahany Lilian Kapur, MD  REASON FOR CONSULTATION: Severe acidosis.   HISTORY OF PRESENT ILLNESS: The patient is a 39 year old African American female with past medical history of long-standing alcohol abuse and bilateral tubal ligation who presented to [facility] with complaints of nausea, vomiting, and hematemesis. As above, the patient has long-standing history of alcohol abuse and drinks approximately 8 to 10 vodka drinks a day. Over the past several days, the patient has developed nausea and vomiting. She has had some periods of hematemesis as well. Upon arrival here, she was found to have severe metabolic derangements. Her serum bicarbonate was noted to be extremely low at 4. The patient had an alcohol level of 0.05. ABG showed an acidosis with a pH of 7.23 and a pCO2 that was quite low at 20, which means that there is some compensation for her acute metabolic acidosis. Anion gap is quite high at 34. We also calculated osmole gap, which was found to be normal. The patient was questioned about additional alcohol intoxications and we questioned her specifically regarding methanol and ethylene glycol. The patient denies ingestion of these types of alcohols. The patient does report that she did have some mouthwash ingestion about a week ago. Poison Control has been contacted by Dr. Lala Lund. The patient is currently maintained on a bicarbonate drip. Serum volatile screen has been sent, however, the results of this will not be available for several days likely. There is still some suspicion for non-ethanol alcohol ingestion. Therefore, we are currently considering institution of hemodialysis.   PAST MEDICAL HISTORY:  1. Ongoing alcohol abuse.  2. History of bilateral tubal ligation.  3. History of heroin  addiction   CURRENT INPATIENT MEDICATIONS:  1. Sodium bicarbonate drip 100 mEq at 120 mL/hour. 2. Banana bag with multivitamin, thiamine, folic acid, and magnesium at 40 mL/h.  3. Tylox 5/325 mg 1 to 2 tablets p.o. every four hours p.r.n.  4. Librium 10 mg p.o. every six hours. 5. Clonidine 0.2 mg p.o. three times daily. 6. Haldol 2 mg IV every four hours p.r.n. 7. Hydralazine 10 to 20 mg IV every four hours. 8. Ativan 2 mg IV every one hour p.r.n. anxiety. 9. Zofran 4 mg IV every four hours p.r.n. nausea.  10. Protonix 40 mg IV every 6 a.m.  11. Phenergan 12.5 mg IV every four hours p.r.n.   DRUG ALLERGIES: No known drug allergies   SOCIAL HISTORY: The patient lives in [place]. She has a boyfriend. She has one son who lives with his father. She reports she works from home, but did not elaborate on this further. She has active tobacco abuse. She has active alcohol abuse and drinks 8 to 10 vodka drinks per day. She denies illicit drug use at present, but has a history of heroin use and she takes Suboxone for this.   FAMILY HISTORY: History is significant for alcohol abuse.   REVIEW OF SYSTEMS: CONSTITUTIONAL: Denies fevers, chills, and weight loss. EYES: Denies any loss of vision or any floaters in her visual fields or any other visual disturbance at present. HEENT: Denies headaches or hearing loss. Denies tinnitus, epistaxis, or sore throat. PULMONARY: Does have some shortness of breath and tachypnea. Denies hemoptysis. CARDIOVASCULAR: Denies chest pain, palpitations, or PND. Noted to be tachycardic. GASTROINTESTINAL: Reports nausea and vomiting. Has  had some loose stools but not numbering more than 1 or 2 per day. Also had some hematemesis. GU: Denies frequency, urgency, or dysuria. MUSCULOSKELETAL: Denies joint pain, swelling, or redness. NEUROLOGIC: Denies focal extremity numbness, weakness, or tingling. ENDOCRINE: Denies polyuria, polydipsia, or polyphagia. HEMATOLOGIC/LYMPHATIC: Denies easy  bruisability, bleeding, or swollen lymph nodes. INTEGUMENTARY: Denies skin rashes or lesions. PSYCHIATRIC: Denies depression or bipolar disorder. Does have history of substance abuse. ALLERGY/IMMUNOLOGIC: Denies seasonal allergies or history of immunodeficiency.   PHYSICAL EXAMINATION:   VITAL SIGNS: Temperature 99.3, pulse 128, respirations 20, blood pressure 181/112, and pulse oximetry 100%.   GENERAL: Well-developed, well-nourished African American female who appears her stated age, currently in no acute distress.   HEENT: Normocephalic, atraumatic. Extraocular movements are intact. Pupils are equal, round, and reactive to light. No scleral icterus, muddy brown sclera noted. No epistaxis noted. Gross hearing intact. Oral mucosa moist.   NECK: Supple and without JVD or lymphadenopathy.   LUNGS: Clear to auscultation bilaterally, but the patient is noted to be tachypneic.   HEART: S1 and S2. The patient is noted to be significantly tachycardic. No murmurs or rubs appreciated.   ABDOMEN: Soft, mild bilateral upper quadrant tenderness noted. No rebound or guarding. No gross organomegaly appreciated.   EXTREMITIES: No clubbing, cyanosis, or edema.   NEUROLOGIC: The patient is alert and oriented to time, person, and place. Strength is 5/5 in both upper and lower extremities.   MUSCULOSKELETAL: No joint redness, swelling, or tenderness appreciated.   SKIN: Warm and dry. No rashes noted.   PSYCHIATRIC: The patient is noted to be a bit anxious at present. She has some insight into her current illness.   LABS/STUDIES: Sodium 133, potassium 3.8, chloride 95, CO2 4, BUN 6, creatinine 0.52, and glucose 78. Anion gap 34. Calculated osmolality 263. Measured osmolality 283. Osmole gap is negative at 2.9. LFTs show total protein 9.5, albumin 4.3, total bilirubin 0.9, alkaline phosphatase 77, AST 128, and ALT 58. Urine drug screen thus far negative. CBC shows WBC 15, hemoglobin 13.9, hematocrit 43, and  platelets 177.   Urinalysis shows urine protein of 30 mg/dL, nitrite negative, leukocyte esterase negative, 1 RBC per high-power field, 1 WBC per high-power field, no bacteria.   Serum salicylate level is noted as being 4.2. Acetaminophen level was less than 2. Urine pregnancy test is negative.   ABG shows pH 7.2, pCO2 20, and pO2 101.  CT of the abdomen and pelvis shows severe fatty infiltration of the liver with posterior left lobe hemangioma versus focal fatty sparing. There is possible distal esophagitis versus minimal sliding type hiatal hernia. Left ovarian cyst is noted. 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 Tiny right renal cyst.   IMPRESSION: This is a 39 year old African American female with past medical history of severe alcohol abuse, prior history of heroin abuse, and bilateral tubal ligation who presented to [facility] with nausea, vomiting, and hematemesis and now found to have severe acidosis.   PROBLEM LIST:  1. Severe metabolic acidosis.  2. Elevated salicylate level.  3. Alcohol abuse.  4. Leukocytosis.   PLAN: The patient presents with a very severe acidosis. Differential diagnosis is extensive, but given her alcohol abuse, non-ethanol alcohol ingestion is a possibility. We directly questioned her about ethanol and ethylene glycol and she denies ingestion of these compounds. She denies any visual disturbance at this point in time. She is unable to give me urine at present to review in the lab. Salicylate toxicity is also a possibility  as the first salicylate level was 3.2 and now is 4.2. The patient is currently receiving bicarbonate infusion; however, we will increase this to 150 mEq at 150 mL/hour. In addition, we will continue cardiac monitoring of the patient. Since volatile acid screen will not be available for several days, and there is some suspicion for occult alcohol ingestion, we will recommend and institute hemodialysis at this point  in time. The patient is agreeable to undergoing hemodialysis. We would also recommend checking a lactic acid level. There did not appear to be any signs of bowel rupture on the CT scan; however, there were signs of fatty liver infiltration. We will continue to follow salicylate levels at present. Hemodialysis should certainly help remove occult alcohol metabolites as well as salicylates. The patient could potentially decompensate quickly. Therefore, we recommend monitoring in the         Critical Care Unit where intubation could be carried out if needed urgently. I have consulted Dr. Leotis Pain and he will be by shortly to place a temporary hemodialysis catheter. Further plan as the patient progresses.   I would like to thank Dr. Lala Lund for this interesting and kind referral.  ____________________________ Tama High, MD mnl:slb D: 02/16/2012 10:43:12 ET T: 02/16/2012 11:01:55 ET JOB#: [id number]  cc: Tama High, MD, <Dictator> Mariah Milling Jaylenn Baiza MD ELECTRONICALLY SIGNED 03/04/2012 21:11

## 2015-02-21 NOTE — H&P (Signed)
PATIENT NAME:  Sarah Serrano, Sarah Serrano MR#:  [id number] DATE OF BIRTH:  1976-09-15  DATE OF ADMISSION:  02/16/2012  REASON FOR HOSPITAL VISIT: Patient came to the hospital with nausea, vomiting.   HISTORY OF PRESENT ILLNESS: This is a 39 year old African American female with history significant for:  1. Severe alcohol abuse.  2. History of bilateral tubal ligation. 3. No other medical problems, which are known to the patient.   39 year old female who drinks eight to ten vodka drinks a night presents to the hospital with three day history of nausea, vomiting and diarrhea, denies any fever or chills, denies any blood in stool or urine, denies eating any bad foods that she can remember, denies any illicit drug use, denies using any illegal alcohol, moonshine or ingestion of any other chemicals. In the ER patient was noted to have a white count of 15,000. She was also found to have a bicarbonate level of 4 along with anion gap of 34 and I was called to admit the patient.   Case was discussed by me with Poison Control and nephrologist on-call, Dr. Holley Raring. Salicylate level, acetaminophen level, toxic alcohol level, serum ethanol level have been all sent out. Patient will currently be admitted to Critical Care Unit for further care.    REVIEW OF SYSTEMS: In my interview patient denies any fever, chills, denies any headache. Positive nausea, vomiting. No problems with vision or hearing. No problems swallowing food or liquids. Denies any chest pain, palpitations, cough, phlegm or shortness of breath. Generalized abdominal ache but no pain per se. Positive diarrhea. No blood in stool or urine. No mucus in stool, no new joint pains or aches. No weakness, tingling, numbness in any extremity. No new mental stressors. Patient is not suicidal or homicidal. No recent weight gain, weight loss.   Full 10 point review of systems was obtained. Except as dictated above all other review of systems is negative.     PAST  MEDICAL AND SURGICAL HISTORY: As above.    FAMILY HISTORY: Positive for alcohol addiction.   PERSONAL HISTORY: Seven to ten alcohol drinks a day. Patient denies any smoking.     PHYSICAL EXAMINATION: VITAL SIGNS: Temperature 98.2, pulse 126, respiration 22, blood pressure 150/103, 98% on room air.   GENERAL: Young African American female lying in hospital bed, anxious, but in no distress.   HEENT: Normocephalic, atraumatic head. Pupils equal in size, reactive to light.    NECK: Supple. No JVD.   PSYCH: Insight is intact. Not suicidal or homicidal.   CENTRAL NERVOUS SYSTEM: Awake, alert, oriented x3. All cranial nerves intact. No focal neurological deficit.   CHEST WALL: Movement bilaterally symmetrical. Good air movement bilaterally. No rhonchi. No wheezes.   CARDIOVASCULAR: Regular rhythm. Normal S1, S2. No gallops. No murmurs.   ABDOMEN: Obese soft, questionable mild ascites. Nontender abdomen.   EXTREMITIES: No cyanosis, clubbing, or edema. No palpable cervical lymph nodes.   MUSCULOSKELETAL: No evidence of joint effusion. Good range of motion in all four extremities.   LABORATORY, DIAGNOSTIC AND RADIOLOGICAL DATA: White count 15,000, hemoglobin 13.9, hematocrit 43, platelets 177. Urinalysis unremarkable. Sodium 133, potassium 3.8, chloride 95, bicarbonate 4, BUN 6, creatinine 0.5, glucose 78, lipase 108. Ethanol level 0.050. Salicylate level 3.2. Urine pregnancy test is negative. ABG: pH 7.23, pCO2 20, pO2 101, bicarbonate 8.4. This is after amp of IV bicarbonate that patient had received.   Prelim report of right upper quadrant ultrasound: Fatty liver.   Please note, there is a CT  abdomen and pelvis ordered by ER physician along with toxic alcohol level which is pending. Please follow up on the results.   ASSESSMENT AND PLAN:  1. Severe metabolic acidosis. This could be alcoholic ketoacidosis worsened by diarrhea, nausea, vomiting. Plan is to admit the patient to Intensive  Care Unit on IV bicarbonate drip. Toxic alcohol level, salicylate, acetaminophen level have been sent out. Please follow on the report. Renal has been consulted, who will see the patient shortly.  2. Alcohol abuse. Patient has been counseled to quit alcohol. She will be placed on CIWA protocol, we had scheduled Librium to reduce withdrawal intensity.  3. High blood pressure. Could be early DTs. Patient will be placed on clonidine scheduled and monitored.  4. Fatty liver on ultrasound. Please follow full ultrasound report along with the CT abdomen and pelvis ordered in the ER by the ER physician.  5. Patient will get SCDs for deep vein thrombosis prophylaxis.  6. CODE STATUS: She is FULL CODE.   ____________________________ Margaree Mackintosh. Candiss Norse, MD pks:cms D: 02/16/2012 07:30:10 ET T: 02/16/2012 07:52:29 ET JOB#: [id number]  cc: Deno Etienne K. Candiss Norse, MD, <Dictator>  Margaree Mackintosh [facility] MD ELECTRONICALLY SIGNED 02/16/2012 8:35

## 2015-02-21 NOTE — Discharge Summary (Signed)
PATIENT NAME:  Sarah Serrano, Sarah Serrano MR#:  [id number] DATE OF BIRTH:  [birth year]  DATE OF ADMISSION:  02/16/2012 DATE OF DISCHARGE:  02/19/2012  DISCHARGE DIAGNOSES:  1. Severe acute anion gap metabolic acidosis now resolved status post dialysis. Could be due to salicylate now requiring hemodialysis.  2. Alcohol abuse and withdrawal requiring detox, per psychiatry.  3. Abdominal pain, nausea and vomiting. Providing symptomatic management and pain medication as needed. 4. Accelerated hypertension, improving. Could be due to delirium tremens. 5. Hepatic encephalopathy likely due to alcohol abuse, resolved while on lactulose. Ammonia less than 25.  6. Depression. Stable and further management per psychiatry.  7. Hypokalemia, repleted and resolved.  8. Hypomagnesemia, repleted and resolved.  9. Thrombocytopenia, likely alcohol related, now stable count.   SECONDARY DIAGNOSES:  1. Severe alcohol abuse.  2. History of tubal ligation.   CONSULTANTS:  1. Cephus Shelling, MD - Psychiatry. 2. Anthonette Legato, MD - Nephrology.  PROCEDURES/RADIOLOGY: Left IJ triple-lumen catheter insertion by Dr. Lucky Cowboy on 02/16/2012.   Placement of right femoral dialysis catheter by Dr. Lucky Cowboy on 02/16/2012.   Inpatient hemodialysis, as per nephrology.   CT scan of the abdomen and pelvis with contrast on 02/16/2012 showed severe fatty infiltration of the liver with a possible posterior left lobe hemangioma versus focal fatty sparing. Possible distal esophagitis versus sliding type hiatal hernia. Left-sided ovarian cyst. Uterine leiomyomatous calcification.   Chest x-ray on 02/16/2012 showed clear lung fields. Left jugular venous catheter inserted with the tip projecting over the upper portion of the right atrium. No pneumothorax or pleural effusion.   Abdominal ultrasound on 02/16/2012 showed no acute abnormality, except fatty infiltration of the liver.   MAJOR LABORATORY PANEL: Urinalysis on admission was negative.    Urine culture grew more than 100,000 colonies of anaerobic gram-positive rod.   Urine pregnancy test was negative. Serum salicylate level less than 1.7. Vitamin B12 level within normal limits with value of 448. Initial serum salicylate level was 4.2 on admission. Tylenol level less than 2. Serum ethanol level was negative.   HISTORY AND SHORT HOSPITAL COURSE: The patient is a 39 year old female with the above-mentioned medical problems who was admitted for severe acute anion gap metabolic acidosis thought to be due to salicylate. Nephrology consultation was obtained for the same. They recommended bicarbonate infusion and emergent hemodialysis. Dialysis catheter was placed by Dr. Lucky Cowboy along with central line and the patient was started on hemodialysis to remove toxins. Psychiatry consultation was obtained with Dr. Cephus Shelling for alcohol dependence who recommended detox and subsequently inpatient psychiatry transfer once the patient is medically stable. The patient was slowly improving status post hemodialysis. She was found to have abdominal pain, nausea and vomiting which was thought to be possible transient viral etiology and was improving while on symptomatic treatment. She was also found to have accelerated hypertension which was thought to be due to delirium tremens and was improving while in the hospital. Her ammonia level was found to be very elevated for which she was started on lactulose and was slowly improving. Her ammonia level was 92 and after lactulose it came down to less than 25. She also had severe electrolyte abnormalities including low potassium and magnesium and was repleted and resolved. Her thrombocytopenia was thought to be alcohol-related and her platelet count remained stable and was slowly improving. After discussion with psych, the patient was discharged to inpatient Behavioral Medicine floor for further detoxification.    DISCHARGE PHYSICAL EXAMINATION:   VITALS: On the date  of discharge, her temperature was 97.9, heart rate 90 per minute, respirations 18 per minute, blood pressure 122/81 mmHg, and she was saturating 100% on room air.   CARDIOVASCULAR: S1 and S2 normal. No murmurs, rubs, or gallops.   LUNGS: Clear to auscultation bilaterally. No wheezes, rales, rhonchi, or crepitation.   ABDOMEN: Soft and benign.   NEUROLOGIC: Nonfocal examination. All other physical examination remained at baseline.   DISCHARGE MEDICATIONS:  1. Seroquel 250 mg p.o. at bedtime and 50 mg p.o. twice a day as needed.  2. Celexa 20 mg p.o. daily.  3. Clonidine 0.2 mg p.o. twice a day.   DISCHARGE DIET: Low sodium.   DISCHARGE ACTIVITY: As tolerated.   DISCHARGE INSTRUCTIONS AND FOLLOW-UP: The patient was instructed to follow-up with her primary care physician in 1 to 2 weeks.   TOTAL TIME DISCHARGING THIS PATIENT: 45 minutes.  ____________________________ Lucina Mellow. Manuella Ghazi, MD vss:slb D: 02/28/2012 17:36:29 ET     T: 02/28/2012 17:52:53 ET         JOB#: [id number] cc: Rook Maue S. Manuella Ghazi, MD, <Dictator> Aarti K. Nicolasa Ducking, MD [place], MD Lucina Mellow [facility] MD ELECTRONICALLY SIGNED 02/28/2012 19:09

## 2015-02-21 NOTE — Consult Note (Signed)
PATIENT NAME:  Sarah Serrano, Sarah Serrano MR#:  [id number] DATE OF BIRTH:  [birth year]  DATE OF CONSULTATION:  02/20/2012  REFERRING PHYSICIAN:  Cephus Shelling, MD   CONSULTING PHYSICIAN:  Ether Goebel S. Manuella Ghazi, MD  PRIMARY CARE PHYSICIAN: Not local.    REASON FOR CONSULTATION: Uncontrolled blood pressure.   HISTORY OF PRESENT ILLNESS: The patient is a 39 year old female with a known history of severe alcohol abuse, was admitted on the Medical Service from the 19th of April until the 22nd of April, and she was being treated for alcohol withdrawal and uncontrolled blood pressure along with severe acidosis, which was resolved. She was subsequently transferred to the Psychiatric Unit yesterday by Dr. Nicolasa Ducking for subsequent detoxification. We are being consulted for blood pressure management. The patient does not want to talk and says, "Why did I have to stay here?" She is falling asleep intermittently. Her blood pressure earlier was elevated to 180s but then has normalized to 130s and has been fluctuating, ranging anywhere from 130s to 140s. Her hemoglobin has remained stable, was 8.7 yesterday, and now it is 9.3. There is no gross bleeding. The patient claims one vomiting this morning, although she denies any other symptoms at this time. She wants to sleep.   PAST MEDICAL HISTORY:  1. Severe alcohol abuse.  2. Depression.   ALLERGIES: No known drug allergies.   MEDICATIONS AT HOME: Based on recent discharge instructions:  1. Seroquel 250 mg p.o. at bedtime and 50 mg p.o. b.i.d. as needed.  2. Celexa 20 mg p.o. daily. 3. Clonidine 0.2 mg p.o. b.i.d.   SOCIAL HISTORY: She drinks 7 to 10 alcohol drinks a day. No smoking.   FAMILY HISTORY: Positive for alcohol addiction.   PAST SURGICAL HISTORY: Bilateral tubal ligation.   REVIEW OF SYSTEMS: Review of systems is unable to be obtained as the patient falls back to sleep very easily, does not want to talk.   PHYSICAL EXAMINATION:  VITAL SIGNS: Temperature 98.2,  heart rate 91 per minute, respirations 20 per minute, blood pressure 132/90 mmHg. She is saturating 98% on room air based on the last oxygen saturation while on the Medical floor.   GENERAL: Patient is a 39 year old female lying in the bed comfortably without any acute distress.   EYES: Pupils are equal, round, reactive to light and accommodation. No scleral icterus. Extraocular muscles are intact.   HEENT: Head atraumatic, normocephalic. Oropharynx and nasopharynx clear.   NECK: Supple. No jugular venous distention. No thyroid enlargement or tenderness.   LUNGS: Clear to auscultation bilaterally. No rales, rhonchi or crepitation.   CARDIOVASCULAR: S1, S2 normal. No murmurs, rubs, or gallop.   ABDOMEN: Soft, nontender, nondistended. Bowel sounds are present. No edema or mass.   EXTREMITIES: No cyanosis, clubbing or edema.   NEUROLOGIC: Difficult to evaluate as she would not cooperate but seems nonfocal, moving all her extremities without any difficulties.   PSYCHIATRIC: She cries very easily while talking, seems alert and oriented. She is slow to respond at times and falls back to sleep very easily.   SKIN: No obvious rash, lesion, or ulceration.   LABORATORY, DIAGNOSTIC AND RADIOLOGICAL DATA:  Normal BMP.  Normal liver function tests except AST of 55.  CBC within normal limits except hemoglobin of 9.3, hematocrit 27, platelets 109.   IMPRESSION AND PLAN:  1. Anemia: Likely of chronic disease from ongoing alcohol abuse, There is no obvious blood loss reported.  I do not recommend monitoring her hemoglobin at this time as this is likely  chronic, and she is also not symptomatic from same; so she can follow up outpatient with Hematology, if needed.  2. Uncontrolled hypertension: She is on clonidine, and we recommend continuing the same. At this time she is also on as needed clonidine for better blood pressure control, which is appropriate.  3. Alcohol abuse:  She is on CIWA protocol as  per Psychiatry, and I recommend continuing the same.  4. Depression: She is on Seroquel, and we will continue the same. Further management per Dr. Nicolasa Ducking from Psychiatry.  5.   Abdominal pain: not sure how much of this is real, strong psych component, possible nartcotics seeking, can use Ultram as need. avoid narcotics if at all possible.  CODE STATUS: FULL CODE.   TIME TAKEN: Total time taking care of this patient is  40 minutes.   ____________________________ Lucina Mellow. Manuella Ghazi, MD vss:cbb D: 02/20/2012 16:54:29 ET T: 02/20/2012 17:50:40 ET JOB#: [id number]  cc: Kloie Whiting S. Manuella Ghazi, MD, <Dictator> Lucina Mellow [facility] MD ELECTRONICALLY SIGNED 02/20/2012 21:04
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
## 2018-11-15 ENCOUNTER — Other Ambulatory Visit: Payer: Self-pay

## 2018-11-15 ENCOUNTER — Emergency Department
Admission: EM | Admit: 2018-11-15 | Discharge: 2018-11-15 | Disposition: A | Discharge status: Home / Self Care | Payer: Medicaid Other | Attending: Emergency Medicine | Admitting: Emergency Medicine

## 2018-11-15 ENCOUNTER — Encounter: Payer: Self-pay | Admitting: Emergency Medicine

## 2018-11-15 DIAGNOSIS — L0231 Cutaneous abscess of buttock: Secondary | ICD-10-CM | POA: Diagnosis not present

## 2018-11-15 DIAGNOSIS — F17210 Nicotine dependence, cigarettes, uncomplicated: Secondary | ICD-10-CM | POA: Diagnosis not present

## 2018-11-15 MED ORDER — LIDOCAINE HCL (PF) 1 % IJ SOLN
10.0000 mL | Freq: Once | INTRAMUSCULAR | Status: AC
Start: 2018-11-15 — End: 2018-11-15
  Administered 2018-11-15: 10 mL via INTRADERMAL
  Filled 2018-11-15: qty 10

## 2018-11-15 MED ORDER — SULFAMETHOXAZOLE-TRIMETHOPRIM 800-160 MG PO TABS
1.0000 | ORAL_TABLET | Freq: Once | ORAL | Status: AC
  Administered 2018-11-15: 1 via ORAL
  Filled 2018-11-15: qty 1

## 2018-11-15 MED ORDER — NAPROXEN 500 MG PO TABS: 500.0000 mg | ORAL_TABLET | Freq: Two times a day (BID) | ORAL | 0 refills | Status: DC

## 2018-11-15 MED ORDER — SULFAMETHOXAZOLE-TRIMETHOPRIM 800-160 MG PO TABS: 1.0000 | ORAL_TABLET | Freq: Two times a day (BID) | ORAL | 0 refills | Status: DC

## 2018-11-15 MED ORDER — NAPROXEN 500 MG PO TABS
500.0000 mg | ORAL_TABLET | Freq: Once | ORAL | Status: AC
  Administered 2018-11-15: 500 mg via ORAL
  Filled 2018-11-15: qty 1

## 2018-11-15 NOTE — Discharge Instructions (Signed)
Please take the antibiotic as prescribed twice daily.  Return to the ER for symptoms that change or worsen if unable to schedule an appointment.
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
## 2019-07-06 ENCOUNTER — Encounter: Payer: Self-pay | Admitting: Emergency Medicine

## 2019-07-06 ENCOUNTER — Inpatient Hospital Stay
Admission: EM | Admit: 2019-07-06 | Discharge: 2019-07-14 | DRG: 439 | Disposition: A | Discharge status: Home / Self Care | Payer: Medicaid Other | Attending: Internal Medicine | Admitting: Internal Medicine

## 2019-07-06 ENCOUNTER — Emergency Department: Payer: Medicaid Other

## 2019-07-06 ENCOUNTER — Other Ambulatory Visit: Payer: Self-pay

## 2019-07-06 DIAGNOSIS — Y902 Blood alcohol level of 40-59 mg/100 ml: Secondary | ICD-10-CM | POA: Diagnosis present

## 2019-07-06 DIAGNOSIS — F319 Bipolar disorder, unspecified: Secondary | ICD-10-CM | POA: Diagnosis present

## 2019-07-06 DIAGNOSIS — W010XXA Fall on same level from slipping, tripping and stumbling without subsequent striking against object, initial encounter: Secondary | ICD-10-CM | POA: Diagnosis present

## 2019-07-06 DIAGNOSIS — R4182 Altered mental status, unspecified: Secondary | ICD-10-CM | POA: Diagnosis not present

## 2019-07-06 DIAGNOSIS — G40909 Epilepsy, unspecified, not intractable, without status epilepticus: Secondary | ICD-10-CM | POA: Diagnosis present

## 2019-07-06 DIAGNOSIS — I10 Essential (primary) hypertension: Secondary | ICD-10-CM | POA: Diagnosis present

## 2019-07-06 DIAGNOSIS — K852 Alcohol induced acute pancreatitis without necrosis or infection: Principal | ICD-10-CM | POA: Diagnosis present

## 2019-07-06 DIAGNOSIS — E538 Deficiency of other specified B group vitamins: Secondary | ICD-10-CM | POA: Diagnosis not present

## 2019-07-06 DIAGNOSIS — Z79899 Other long term (current) drug therapy: Secondary | ICD-10-CM

## 2019-07-06 DIAGNOSIS — Z0189 Encounter for other specified special examinations: Secondary | ICD-10-CM | POA: Diagnosis not present

## 2019-07-06 DIAGNOSIS — D61818 Other pancytopenia: Secondary | ICD-10-CM | POA: Diagnosis not present

## 2019-07-06 DIAGNOSIS — K769 Liver disease, unspecified: Secondary | ICD-10-CM | POA: Diagnosis present

## 2019-07-06 DIAGNOSIS — F17210 Nicotine dependence, cigarettes, uncomplicated: Secondary | ICD-10-CM | POA: Diagnosis present

## 2019-07-06 DIAGNOSIS — N83202 Unspecified ovarian cyst, left side: Secondary | ICD-10-CM | POA: Diagnosis present

## 2019-07-06 DIAGNOSIS — Z818 Family history of other mental and behavioral disorders: Secondary | ICD-10-CM

## 2019-07-06 DIAGNOSIS — K146 Glossodynia: Secondary | ICD-10-CM | POA: Diagnosis present

## 2019-07-06 DIAGNOSIS — F1910 Other psychoactive substance abuse, uncomplicated: Secondary | ICD-10-CM | POA: Diagnosis present

## 2019-07-06 DIAGNOSIS — F10239 Alcohol dependence with withdrawal, unspecified: Secondary | ICD-10-CM | POA: Diagnosis not present

## 2019-07-06 DIAGNOSIS — Z0279 Encounter for issue of other medical certificate: Secondary | ICD-10-CM | POA: Diagnosis not present

## 2019-07-06 DIAGNOSIS — E663 Overweight: Secondary | ICD-10-CM | POA: Diagnosis present

## 2019-07-06 DIAGNOSIS — D693 Immune thrombocytopenic purpura: Secondary | ICD-10-CM | POA: Diagnosis present

## 2019-07-06 DIAGNOSIS — D649 Anemia, unspecified: Secondary | ICD-10-CM | POA: Diagnosis present

## 2019-07-06 DIAGNOSIS — E876 Hypokalemia: Secondary | ICD-10-CM | POA: Diagnosis present

## 2019-07-06 DIAGNOSIS — Z20828 Contact with and (suspected) exposure to other viral communicable diseases: Secondary | ICD-10-CM | POA: Diagnosis present

## 2019-07-06 DIAGNOSIS — D259 Leiomyoma of uterus, unspecified: Secondary | ICD-10-CM | POA: Diagnosis present

## 2019-07-06 DIAGNOSIS — D696 Thrombocytopenia, unspecified: Secondary | ICD-10-CM

## 2019-07-06 DIAGNOSIS — K859 Acute pancreatitis without necrosis or infection, unspecified: Secondary | ICD-10-CM | POA: Diagnosis present

## 2019-07-06 DIAGNOSIS — Z6829 Body mass index (BMI) 29.0-29.9, adult: Secondary | ICD-10-CM

## 2019-07-06 HISTORY — DX: Other psychoactive substance abuse, uncomplicated: F19.10

## 2019-07-06 HISTORY — DX: Alcohol abuse, uncomplicated: F10.10

## 2019-07-06 HISTORY — DX: Bipolar disorder, unspecified: F31.9

## 2019-07-06 LAB — COMPREHENSIVE METABOLIC PANEL
ALT: 21 U/L (ref 0–44)
AST: 77 U/L — ABNORMAL HIGH (ref 15–41)
Albumin: 3.5 g/dL (ref 3.5–5.0)
Alkaline Phosphatase: 73 U/L (ref 38–126)
Anion gap: 20 — ABNORMAL HIGH (ref 5–15)
BUN: 10 mg/dL (ref 6–20)
CO2: 22 mmol/L (ref 22–32)
Calcium: 9 mg/dL (ref 8.9–10.3)
Chloride: 94 mmol/L — ABNORMAL LOW (ref 98–111)
Creatinine, Ser: 0.4 mg/dL — ABNORMAL LOW (ref 0.44–1.00)
GFR calc Af Amer: >60 mL/min (ref >60)
GFR calc non Af Amer: >60 mL/min (ref >60)
Glucose, Bld: 121 mg/dL — ABNORMAL HIGH (ref 70–99)
Potassium: 3.5 mmol/L (ref 3.5–5.1)
Sodium: 136 mmol/L (ref 135–145)
Total Bilirubin: 1.1 mg/dL (ref 0.3–1.2)
Total Protein: 8.3 g/dL — ABNORMAL HIGH (ref 6.5–8.1)

## 2019-07-06 LAB — CBC WITH DIFFERENTIAL/PLATELET
Abs Immature Granulocytes: 0.01 10*3/uL (ref 0.00–0.07)
Basophils Absolute: 0 10*3/uL (ref 0.0–0.1)
Basophils Relative: 1 %
Eosinophils Absolute: 0 10*3/uL (ref 0.0–0.5)
Eosinophils Relative: 0 %
HCT: 35.2 % — ABNORMAL LOW (ref 36.0–46.0)
Hemoglobin: 12.5 g/dL (ref 12.0–15.0)
Immature Granulocytes: 0 %
Lymphocytes Relative: 25 %
Lymphs Abs: 1.3 10*3/uL (ref 0.7–4.0)
MCH: 31.4 pg (ref 26.0–34.0)
MCHC: 35.5 g/dL (ref 30.0–36.0)
MCV: 88.4 fL (ref 80.0–100.0)
Monocytes Absolute: 0.1 10*3/uL (ref 0.1–1.0)
Monocytes Relative: 1 %
Neutro Abs: 3.6 10*3/uL (ref 1.7–7.7)
Neutrophils Relative %: 73 %
Platelets: 12 10*3/uL — CL (ref 150–400)
RBC: 3.98 MIL/uL (ref 3.87–5.11)
RDW: 16.8 % — ABNORMAL HIGH (ref 11.5–15.5)
Smear Review: DECREASED
WBC: 5 10*3/uL (ref 4.0–10.5)
nRBC: 0.6 % — ABNORMAL HIGH (ref 0.0–0.2)

## 2019-07-06 LAB — HCG, QUANTITATIVE, PREGNANCY: hCG, Beta Chain, Quant, S: <1 m[IU]/mL (ref <5)

## 2019-07-06 LAB — TROPONIN I (HIGH SENSITIVITY): Troponin I (High Sensitivity): 9 ng/L (ref <18)

## 2019-07-06 LAB — ETHANOL: Alcohol, Ethyl (B): 43 mg/dL — ABNORMAL HIGH (ref <10)

## 2019-07-06 LAB — LIPASE, BLOOD: Lipase: 165 U/L — ABNORMAL HIGH (ref 11–51)

## 2019-07-06 IMAGING — CT CT ABD-PELV W/ CM
2 of 4 series · 16 of 46 positions shown, 18 images · IV contrast (APPLIED)
Comparison: [DATE].

CLINICAL DATA: Acute, generalized abdominal pain and vomiting.
Shortness of breath.

EXAM:
CT ABDOMEN AND PELVIS WITH CONTRAST
TECHNIQUE: Multidetector CT imaging of the abdomen and pelvis was performed
using the standard protocol following bolus administration of
intravenous contrast.
CONTRAST:  100mL OMNIPAQUE IOHEXOL 300 MG/ML  SOLN

[Series 2: routine abd/pel with · axial · 0.85mm/px · z∈[-650,-280]mm · 13 of 82 slices shown, 15 images]
[im 4/82  soft-tissue]
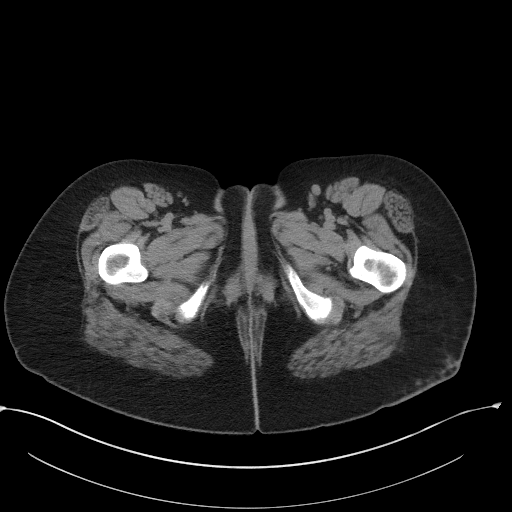
[im 4/82  bone]
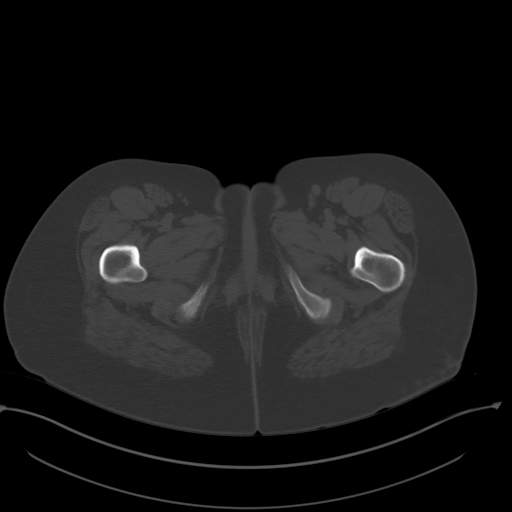
[im 10/82  soft-tissue]
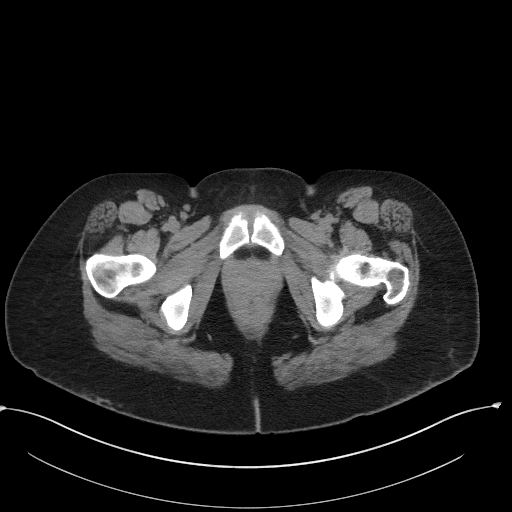
[im 17/82  soft-tissue]
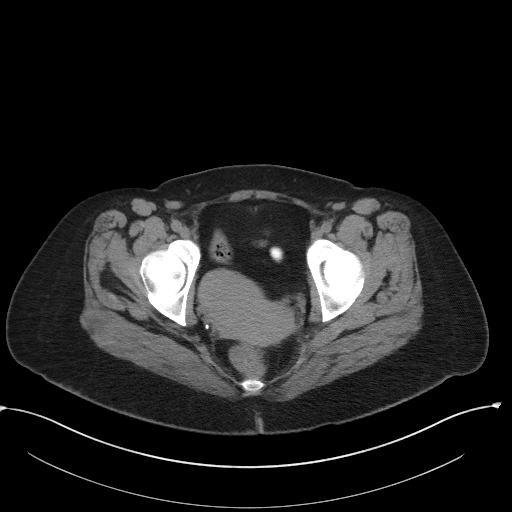
[im 23/82  soft-tissue]
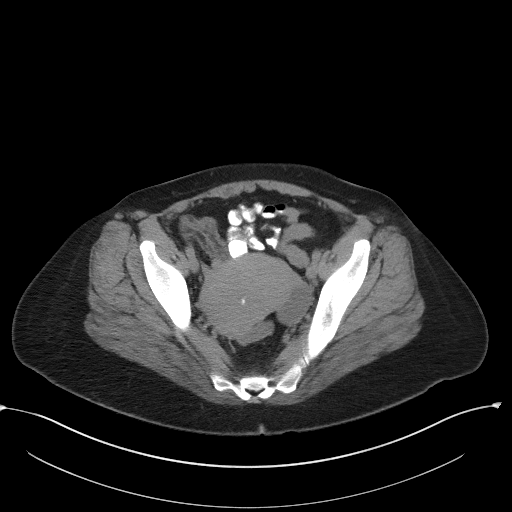
[im 30/82  soft-tissue]
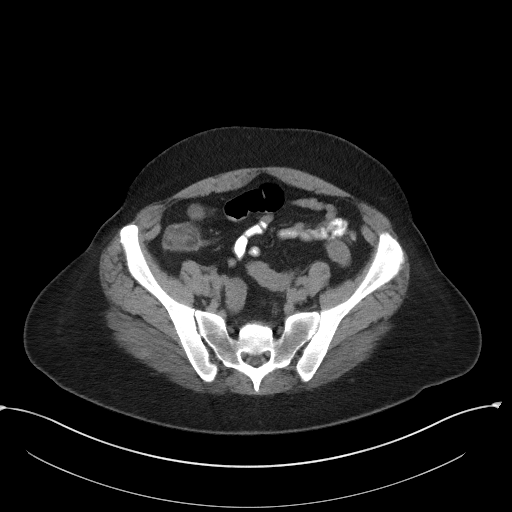
[im 36/82  soft-tissue]
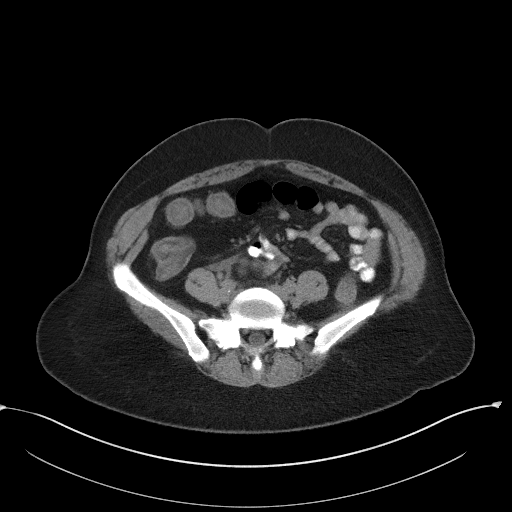
[im 43/82  soft-tissue]
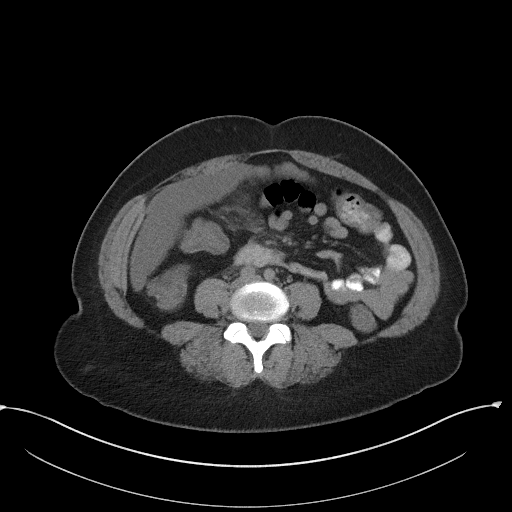
[im 46/82  soft-tissue]
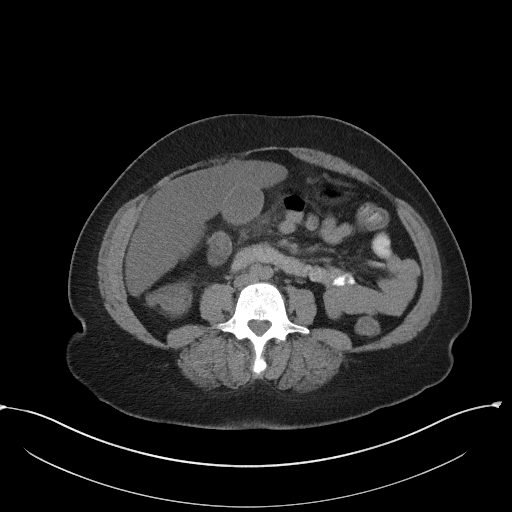
[im 52/82  soft-tissue]
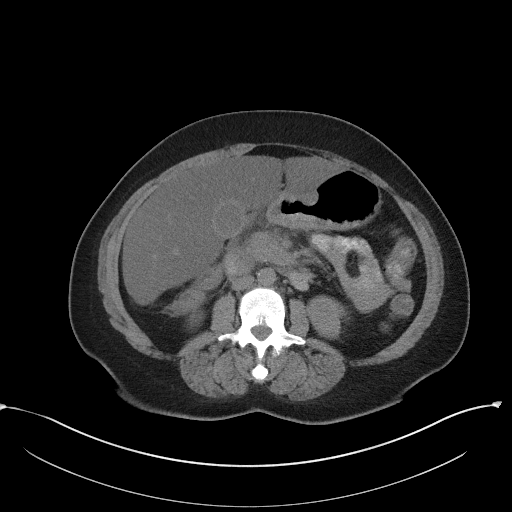
[im 52/82  bone]
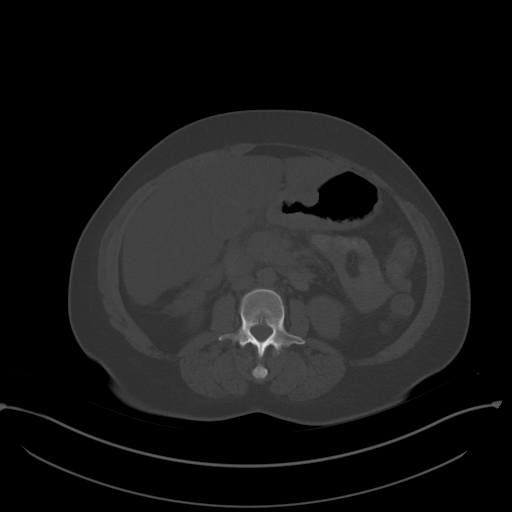
[im 59/82  soft-tissue]
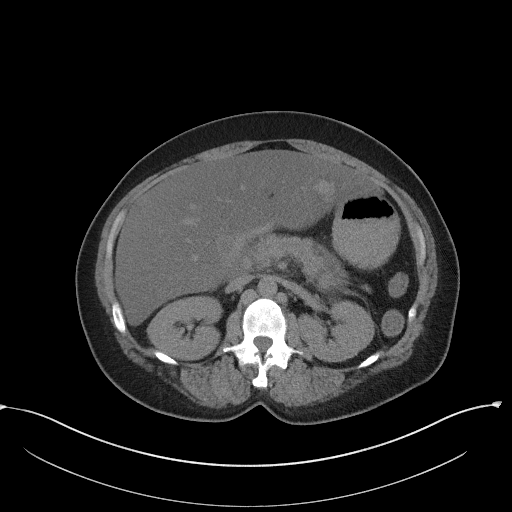
[im 65/82  soft-tissue]
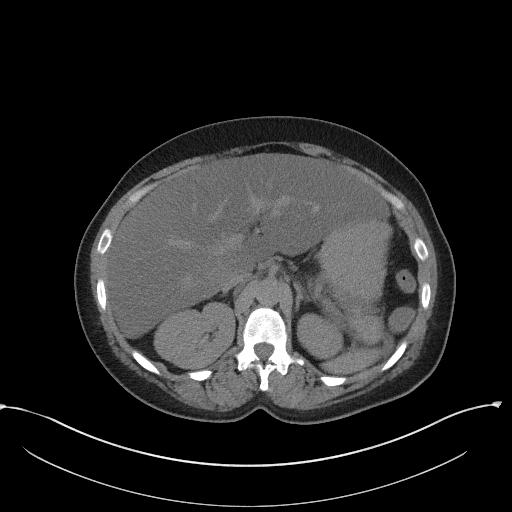
[im 72/82  soft-tissue]
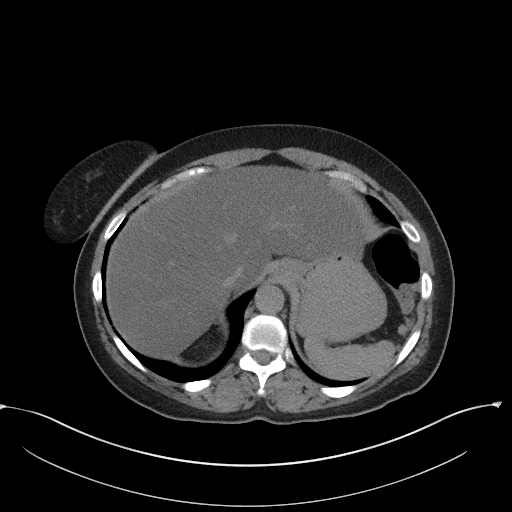
[im 78/82  soft-tissue]
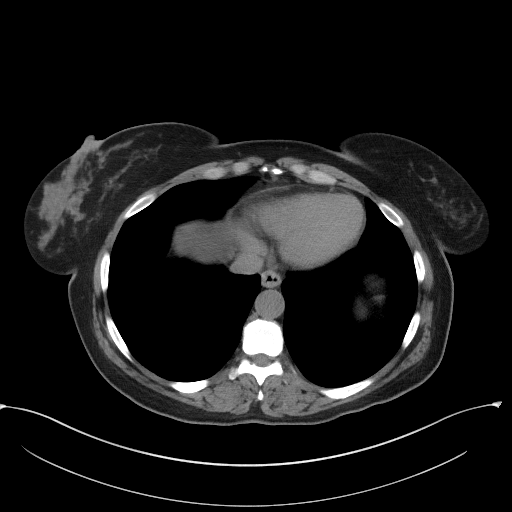

[Series 5: coronal st · coronal · 0.64mm/px · 3 of 90 slices shown]
[im 30/90  soft-tissue]
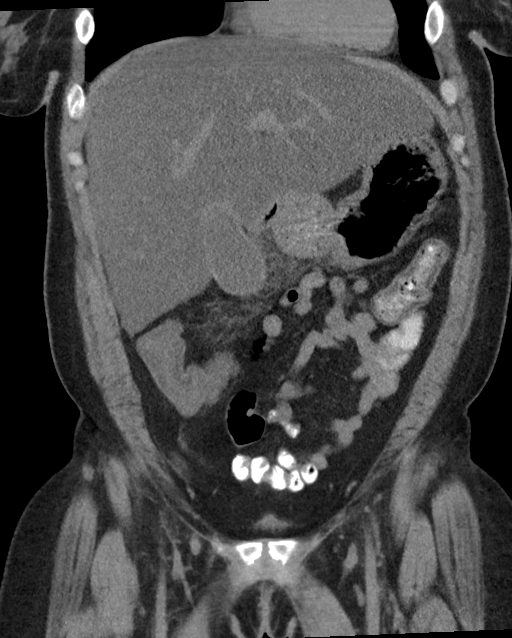
[im 40/90  soft-tissue]
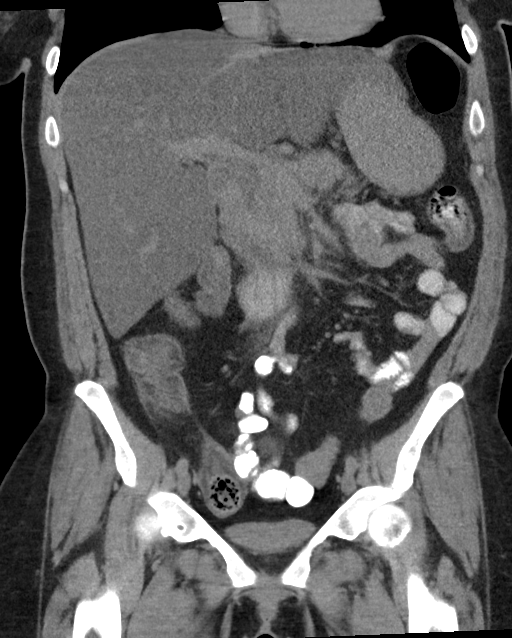
[im 50/90  soft-tissue]
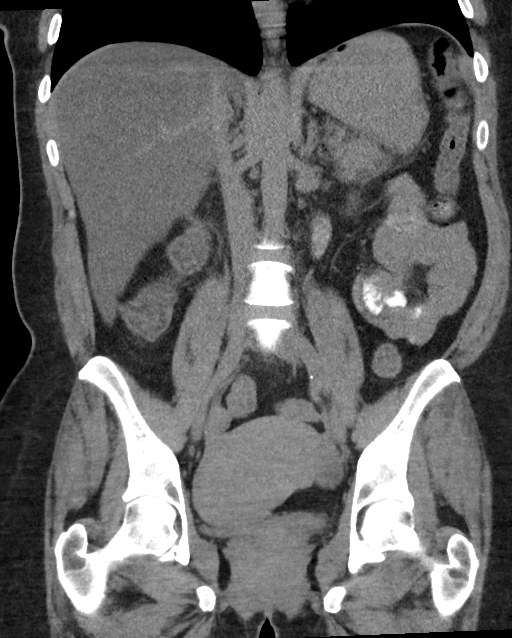

[16 of 46 positions shown; findings below may reference images not displayed]

FINDINGS: Lower chest: Unremarkable.

Hepatobiliary: Marked diffuse low density of the liver relative to
the spleen. The liver is enlarged, most pronounced involving the
lateral segment left lobe and caudate lobe. Mild diffuse gallbladder
wall enhancement without thickening or pericholecystic fluid.

Pancreas: Diffuse peripancreatic soft tissue stranding and edema.
This tracks superiorly and anteriorly. No discrete fluid collections
are seen.

Spleen: Normal in size without focal abnormality.

Adrenals/Urinary Tract: Adrenal glands are unremarkable. Kidneys are
normal, without renal calculi, focal lesion, or hydronephrosis.
Bladder is unremarkable.

Stomach/Bowel: Mild low density distal gastric wall thickening.
Normal appearing appendix, small bowel and colon.

Vascular/Lymphatic: No significant vascular findings are present. No
enlarged abdominal or pelvic lymph nodes.

Reproductive: Enlarged, lobulated uterus containing coarse
calcifications. Previously, with intravenous contrast, there were
corresponding uterine masses. A simple appearing left ovarian cyst
has not changed significantly, measuring 4.4 x 2.3 cm today on image
number 62 series 2, previously 3.8 x 2.4 cm. Normal appearing right
ovary containing a small follicular cyst.

Other: Tiny umbilical hernia containing fat.

Musculoskeletal: Unremarkable bones.
IMPRESSION: 1. Acute pancreatitis without abscess.
2. Mild low density distal gastric wall thickening, most likely
secondary to the acute pancreatitis. Concomitant acute gastritis is
less likely.
3. Marked diffuse hepatic steatosis.
4. Hepatomegaly.
5. Stable simple appearing left ovarian cyst.

## 2019-07-06 MED ORDER — PROMETHAZINE HCL 25 MG/ML IJ SOLN
12.5000 mg | Freq: Once | INTRAMUSCULAR | Status: AC
  Administered 2019-07-07: 12.5 mg via INTRAVENOUS
  Filled 2019-07-06: qty 1

## 2019-07-06 MED ORDER — ONDANSETRON HCL 4 MG/2ML IJ SOLN
INTRAMUSCULAR | Status: AC
  Administered 2019-07-06: 4 mg via INTRAVENOUS
  Filled 2019-07-06: qty 4

## 2019-07-06 MED ORDER — ONDANSETRON HCL 4 MG/2ML IJ SOLN
4.0000 mg | Freq: Once | INTRAMUSCULAR | Status: AC
  Administered 2019-07-06: 19:00:00 4 mg via INTRAVENOUS

## 2019-07-06 MED ORDER — PROMETHAZINE HCL 25 MG/ML IJ SOLN
25.0000 mg | Freq: Once | INTRAMUSCULAR | Status: AC
  Administered 2019-07-06: 25 mg via INTRAMUSCULAR
  Filled 2019-07-06: qty 1

## 2019-07-06 MED ORDER — SODIUM CHLORIDE 0.9 % IV SOLN: Freq: Once | INTRAVENOUS | Status: DC

## 2019-07-06 MED ORDER — HYDROMORPHONE HCL 1 MG/ML IJ SOLN
0.5000 mg | Freq: Once | INTRAMUSCULAR | Status: AC
  Administered 2019-07-06: 19:00:00 0.5 mg via INTRAVENOUS

## 2019-07-06 MED ORDER — HYDROMORPHONE HCL 1 MG/ML IJ SOLN
INTRAMUSCULAR | Status: AC
  Administered 2019-07-06: 0.5 mg via INTRAVENOUS
  Filled 2019-07-06: qty 1

## 2019-07-06 MED ORDER — SODIUM CHLORIDE 0.9% FLUSH: 3.0000 mL | Freq: Once | INTRAVENOUS | Status: DC

## 2019-07-06 MED ORDER — IOHEXOL 9 MG/ML PO SOLN
500.0000 mL | Freq: Two times a day (BID) | ORAL | Status: DC | PRN
  Administered 2019-07-06 (×2): 500 mL via ORAL
  Filled 2019-07-06 (×2): qty 500

## 2019-07-06 MED ORDER — MORPHINE SULFATE (PF) 4 MG/ML IV SOLN
4.0000 mg | Freq: Once | INTRAVENOUS | Status: AC
  Administered 2019-07-06: 18:00:00 4 mg via INTRAVENOUS
  Filled 2019-07-06: qty 1

## 2019-07-06 MED ORDER — SODIUM CHLORIDE 0.9 % IV BOLUS
1000.0000 mL | Freq: Once | INTRAVENOUS | Status: AC
  Administered 2019-07-06: 18:00:00 1000 mL via INTRAVENOUS

## 2019-07-06 MED ORDER — SODIUM CHLORIDE 0.9 % IV SOLN
8.0000 mg | Freq: Once | INTRAVENOUS | Status: DC
  Filled 2019-07-06: qty 4

## 2019-07-06 MED ORDER — HYDROMORPHONE HCL 1 MG/ML IJ SOLN
0.5000 mg | Freq: Once | INTRAMUSCULAR | Status: AC
  Administered 2019-07-07: 0.5 mg via INTRAVENOUS
  Filled 2019-07-06: qty 1

## 2019-07-06 MED ORDER — ONDANSETRON HCL 4 MG/2ML IJ SOLN
4.0000 mg | Freq: Once | INTRAMUSCULAR | Status: AC | PRN
  Administered 2019-07-07: 03:00:00 4 mg via INTRAVENOUS
  Filled 2019-07-06: qty 2

## 2019-07-06 MED ORDER — ONDANSETRON HCL 4 MG/2ML IJ SOLN
4.0000 mg | Freq: Once | INTRAMUSCULAR | Status: AC
  Administered 2019-07-06: 4 mg via INTRAVENOUS
  Filled 2019-07-06: qty 2

## 2019-07-06 MED ORDER — IOHEXOL 300 MG/ML  SOLN
100.0000 mL | Freq: Once | INTRAMUSCULAR | Status: AC | PRN
  Administered 2019-07-06: 100 mL via INTRAVENOUS

## 2019-07-06 MED ORDER — HYDROMORPHONE HCL 1 MG/ML IJ SOLN
1.0000 mg | Freq: Once | INTRAMUSCULAR | Status: AC
  Administered 2019-07-06: 1 mg via INTRAMUSCULAR
  Filled 2019-07-06: qty 1

## 2019-07-06 NOTE — ED Notes (Signed)
Per EDP Malinda, okay to give 8mg  zofran ordered as slow IVP instead of waiting for drip to be made from pharmacy d/t pt continually vomiting.

## 2019-07-06 NOTE — ED Notes (Signed)
IV attempted by Theadora Rama, RN after x1 unsuccessful start in triage. Pt states she has needed IVs in her neck before or placed by [place]. Able to obtain blood work from IV and give morphine and zofran, however catheter was not able to advance far enough to secure line for IVF infusion.

## 2019-07-06 NOTE — Progress Notes (Signed)
In Progress  07/06/19 10:47 PM [place], Vida Roller, RN   IV team consulted again after PIV placement to LUA with ultrasound. IV infiltrated in CT. IV team consulted again to IV placement. This RN had 3 unsuccessful attempts to place midline with [place]. Per patient she has had central lines and PICC lines in the past. Recommend a more advanced line at this time. Primary RN informed of recommendation.

## 2019-07-06 NOTE — ED Triage Notes (Signed)
PT to ER with c/o vomiting that started last night.  Pt states had chest pressure that started prior to vomiting, and now she describes it as hurting.  Pt actively vomiting in triage, diaphoretic.  Pt states SHOB.  Pt states pain radiates to her lower back.
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
## 2019-07-06 NOTE — ED Notes (Signed)
Date and time results received: 07/06/19 6:52 PM  Test: platelets Critical Value: 12  Name of Provider Notified: Dr. Cinda Quest  Orders Received? Or Actions Taken?: no new orders at this time

## 2019-07-07 ENCOUNTER — Other Ambulatory Visit: Payer: Medicaid Other

## 2019-07-07 ENCOUNTER — Inpatient Hospital Stay: Payer: Medicaid Other

## 2019-07-07 DIAGNOSIS — F3130 Bipolar disorder, current episode depressed, mild or moderate severity, unspecified: Secondary | ICD-10-CM | POA: Diagnosis not present

## 2019-07-07 DIAGNOSIS — Z818 Family history of other mental and behavioral disorders: Secondary | ICD-10-CM | POA: Diagnosis not present

## 2019-07-07 DIAGNOSIS — R4182 Altered mental status, unspecified: Secondary | ICD-10-CM | POA: Diagnosis not present

## 2019-07-07 DIAGNOSIS — F10239 Alcohol dependence with withdrawal, unspecified: Secondary | ICD-10-CM | POA: Diagnosis not present

## 2019-07-07 DIAGNOSIS — F319 Bipolar disorder, unspecified: Secondary | ICD-10-CM | POA: Diagnosis present

## 2019-07-07 DIAGNOSIS — E538 Deficiency of other specified B group vitamins: Secondary | ICD-10-CM | POA: Diagnosis not present

## 2019-07-07 DIAGNOSIS — R410 Disorientation, unspecified: Secondary | ICD-10-CM | POA: Diagnosis not present

## 2019-07-07 DIAGNOSIS — K146 Glossodynia: Secondary | ICD-10-CM | POA: Diagnosis present

## 2019-07-07 DIAGNOSIS — K769 Liver disease, unspecified: Secondary | ICD-10-CM | POA: Diagnosis present

## 2019-07-07 DIAGNOSIS — Y902 Blood alcohol level of 40-59 mg/100 ml: Secondary | ICD-10-CM | POA: Diagnosis present

## 2019-07-07 DIAGNOSIS — I10 Essential (primary) hypertension: Secondary | ICD-10-CM | POA: Diagnosis present

## 2019-07-07 DIAGNOSIS — D696 Thrombocytopenia, unspecified: Secondary | ICD-10-CM

## 2019-07-07 DIAGNOSIS — Z1339 Encounter for screening examination for other mental health and behavioral disorders: Secondary | ICD-10-CM | POA: Diagnosis not present

## 2019-07-07 DIAGNOSIS — Z6829 Body mass index (BMI) 29.0-29.9, adult: Secondary | ICD-10-CM | POA: Diagnosis not present

## 2019-07-07 DIAGNOSIS — Z0279 Encounter for issue of other medical certificate: Secondary | ICD-10-CM | POA: Diagnosis not present

## 2019-07-07 DIAGNOSIS — F3131 Bipolar disorder, current episode depressed, mild: Secondary | ICD-10-CM | POA: Diagnosis not present

## 2019-07-07 DIAGNOSIS — E876 Hypokalemia: Secondary | ICD-10-CM | POA: Diagnosis present

## 2019-07-07 DIAGNOSIS — F17210 Nicotine dependence, cigarettes, uncomplicated: Secondary | ICD-10-CM | POA: Diagnosis present

## 2019-07-07 DIAGNOSIS — D61818 Other pancytopenia: Secondary | ICD-10-CM | POA: Diagnosis not present

## 2019-07-07 DIAGNOSIS — D259 Leiomyoma of uterus, unspecified: Secondary | ICD-10-CM | POA: Diagnosis present

## 2019-07-07 DIAGNOSIS — R569 Unspecified convulsions: Secondary | ICD-10-CM

## 2019-07-07 DIAGNOSIS — E663 Overweight: Secondary | ICD-10-CM | POA: Diagnosis present

## 2019-07-07 DIAGNOSIS — G40909 Epilepsy, unspecified, not intractable, without status epilepticus: Secondary | ICD-10-CM | POA: Diagnosis present

## 2019-07-07 DIAGNOSIS — F3189 Other bipolar disorder: Secondary | ICD-10-CM | POA: Diagnosis not present

## 2019-07-07 DIAGNOSIS — D693 Immune thrombocytopenic purpura: Secondary | ICD-10-CM | POA: Diagnosis present

## 2019-07-07 DIAGNOSIS — F1910 Other psychoactive substance abuse, uncomplicated: Secondary | ICD-10-CM | POA: Diagnosis present

## 2019-07-07 DIAGNOSIS — D649 Anemia, unspecified: Secondary | ICD-10-CM | POA: Diagnosis not present

## 2019-07-07 DIAGNOSIS — N83202 Unspecified ovarian cyst, left side: Secondary | ICD-10-CM | POA: Diagnosis present

## 2019-07-07 DIAGNOSIS — F3160 Bipolar disorder, current episode mixed, unspecified: Secondary | ICD-10-CM | POA: Diagnosis not present

## 2019-07-07 DIAGNOSIS — K852 Alcohol induced acute pancreatitis without necrosis or infection: Secondary | ICD-10-CM | POA: Diagnosis present

## 2019-07-07 DIAGNOSIS — Z79899 Other long term (current) drug therapy: Secondary | ICD-10-CM | POA: Diagnosis not present

## 2019-07-07 DIAGNOSIS — Z0289 Encounter for other administrative examinations: Secondary | ICD-10-CM | POA: Diagnosis not present

## 2019-07-07 DIAGNOSIS — K859 Acute pancreatitis without necrosis or infection, unspecified: Secondary | ICD-10-CM | POA: Diagnosis present

## 2019-07-07 DIAGNOSIS — Z20828 Contact with and (suspected) exposure to other viral communicable diseases: Secondary | ICD-10-CM | POA: Diagnosis present

## 2019-07-07 DIAGNOSIS — W010XXA Fall on same level from slipping, tripping and stumbling without subsequent striking against object, initial encounter: Secondary | ICD-10-CM | POA: Diagnosis present

## 2019-07-07 LAB — CBC
HCT: 35.1 % — ABNORMAL LOW (ref 36.0–46.0)
Hemoglobin: 12.6 g/dL (ref 12.0–15.0)
MCH: 31.7 pg (ref 26.0–34.0)
MCHC: 35.9 g/dL (ref 30.0–36.0)
MCV: 88.4 fL (ref 80.0–100.0)
Platelets: 9 10*3/uL — CL (ref 150–400)
RBC: 3.97 MIL/uL (ref 3.87–5.11)
RDW: 16.7 % — ABNORMAL HIGH (ref 11.5–15.5)
WBC: 4.6 10*3/uL (ref 4.0–10.5)
nRBC: 0.7 % — ABNORMAL HIGH (ref 0.0–0.2)

## 2019-07-07 LAB — CBC WITH DIFFERENTIAL/PLATELET
Abs Immature Granulocytes: 0.05 10*3/uL (ref 0.00–0.07)
Basophils Absolute: 0 10*3/uL (ref 0.0–0.1)
Basophils Relative: 0 %
Eosinophils Absolute: 0 10*3/uL (ref 0.0–0.5)
Eosinophils Relative: 0 %
HCT: 28.4 % — ABNORMAL LOW (ref 36.0–46.0)
Hemoglobin: 10.3 g/dL — ABNORMAL LOW (ref 12.0–15.0)
Immature Granulocytes: 1 %
Lymphocytes Relative: 25 %
Lymphs Abs: 1.1 10*3/uL (ref 0.7–4.0)
MCH: 31.9 pg (ref 26.0–34.0)
MCHC: 36.3 g/dL — ABNORMAL HIGH (ref 30.0–36.0)
MCV: 87.9 fL (ref 80.0–100.0)
Monocytes Absolute: 0.1 10*3/uL (ref 0.1–1.0)
Monocytes Relative: 1 %
Neutro Abs: 3.3 10*3/uL (ref 1.7–7.7)
Neutrophils Relative %: 73 %
Platelets: 4 10*3/uL — CL (ref 150–400)
RBC: 3.23 MIL/uL — ABNORMAL LOW (ref 3.87–5.11)
RDW: 16.3 % — ABNORMAL HIGH (ref 11.5–15.5)
Smear Review: DECREASED
WBC: 4.6 10*3/uL (ref 4.0–10.5)
nRBC: 2.2 % — ABNORMAL HIGH (ref 0.0–0.2)

## 2019-07-07 LAB — TECHNOLOGIST SMEAR REVIEW: Plt Morphology: DECREASED

## 2019-07-07 LAB — PROTIME-INR
INR: 1 (ref 0.8–1.2)
Prothrombin Time: 12.7 seconds (ref 11.4–15.2)

## 2019-07-07 LAB — URINALYSIS, COMPLETE (UACMP) WITH MICROSCOPIC
Bacteria, UA: NONE SEEN
Glucose, UA: NEGATIVE mg/dL
Ketones, ur: NEGATIVE mg/dL
Leukocytes,Ua: NEGATIVE
Nitrite: NEGATIVE
Protein, ur: 100 mg/dL — AB
RBC / HPF: >50 RBC/hpf — ABNORMAL HIGH (ref 0–5)
Specific Gravity, Urine: >1.046 — ABNORMAL HIGH (ref 1.005–1.030)
pH: 6 (ref 5.0–8.0)

## 2019-07-07 LAB — URINE DRUG SCREEN, QUALITATIVE (ARMC ONLY)
Amphetamines, Ur Screen: NOT DETECTED
Barbiturates, Ur Screen: NOT DETECTED
Benzodiazepine, Ur Scrn: NOT DETECTED
Cannabinoid 50 Ng, Ur ~~LOC~~: NOT DETECTED
Cocaine Metabolite,Ur ~~LOC~~: NOT DETECTED
MDMA (Ecstasy)Ur Screen: NOT DETECTED
Methadone Scn, Ur: NOT DETECTED
Opiate, Ur Screen: POSITIVE — AB
Phencyclidine (PCP) Ur S: NOT DETECTED
Tricyclic, Ur Screen: POSITIVE — AB

## 2019-07-07 LAB — PLATELET COUNT: Platelets: 5 10*3/uL — CL (ref 150–400)

## 2019-07-07 LAB — TROPONIN I (HIGH SENSITIVITY): Troponin I (High Sensitivity): 20 ng/L — ABNORMAL HIGH (ref <18)

## 2019-07-07 LAB — TSH: TSH: 3.02 u[IU]/mL (ref 0.350–4.500)

## 2019-07-07 LAB — ABO/RH: ABO/RH(D): O POS

## 2019-07-07 LAB — RETICULOCYTES
Immature Retic Fract: 5.5 % (ref 2.3–15.9)
RBC.: 3.81 MIL/uL — ABNORMAL LOW (ref 3.87–5.11)
Retic Count, Absolute: 19.4 10*3/uL (ref 19.0–186.0)
Retic Ct Pct: 0.5 % (ref 0.4–3.1)

## 2019-07-07 LAB — GLUCOSE, CAPILLARY
Glucose-Capillary: 278 mg/dL — ABNORMAL HIGH (ref 70–99)
Glucose-Capillary: 170 mg/dL — ABNORMAL HIGH (ref 70–99)

## 2019-07-07 LAB — MRSA PCR SCREENING: MRSA by PCR: NEGATIVE

## 2019-07-07 LAB — PHOSPHORUS: Phosphorus: 3.9 mg/dL (ref 2.5–4.6)

## 2019-07-07 LAB — SARS CORONAVIRUS 2 (TAT 6-24 HRS): SARS Coronavirus 2: NEGATIVE

## 2019-07-07 LAB — SARS CORONAVIRUS 2 BY RT PCR (HOSPITAL ORDER, PERFORMED IN ~~LOC~~ HOSPITAL LAB): SARS Coronavirus 2: NEGATIVE

## 2019-07-07 LAB — PREGNANCY, URINE: Preg Test, Ur: NEGATIVE

## 2019-07-07 LAB — MAGNESIUM: Magnesium: 1.6 mg/dL — ABNORMAL LOW (ref 1.7–2.4)

## 2019-07-07 LAB — FIBRINOGEN: Fibrinogen: 163 mg/dL — ABNORMAL LOW (ref 210–475)

## 2019-07-07 LAB — LACTATE DEHYDROGENASE: LDH: 456 U/L — ABNORMAL HIGH (ref 98–192)

## 2019-07-07 MED ORDER — METOCLOPRAMIDE HCL 5 MG/ML IJ SOLN
5.0000 mg | Freq: Four times a day (QID) | INTRAMUSCULAR | Status: DC | PRN
  Administered 2019-07-07 – 2019-07-12 (×5): 5 mg via INTRAVENOUS
  Filled 2019-07-07 (×5): qty 2

## 2019-07-07 MED ORDER — HYDROMORPHONE HCL 1 MG/ML IJ SOLN
0.5000 mg | INTRAMUSCULAR | Status: DC | PRN
  Administered 2019-07-07 – 2019-07-12 (×22): 0.5 mg via INTRAVENOUS
  Filled 2019-07-07 (×22): qty 1

## 2019-07-07 MED ORDER — NITROGLYCERIN 2 % TD OINT
0.5000 [in_us] | TOPICAL_OINTMENT | Freq: Four times a day (QID) | TRANSDERMAL | Status: DC
  Administered 2019-07-07 – 2019-07-12 (×15): 0.5 [in_us] via TOPICAL
  Filled 2019-07-07 (×16): qty 1

## 2019-07-07 MED ORDER — NITROGLYCERIN 0.4 MG SL SUBL
SUBLINGUAL_TABLET | SUBLINGUAL | Status: AC
  Filled 2019-07-07: qty 1

## 2019-07-07 MED ORDER — MORPHINE SULFATE (PF) 4 MG/ML IV SOLN: 4.0000 mg | INTRAVENOUS | Status: DC | PRN

## 2019-07-07 MED ORDER — SODIUM CHLORIDE 0.9% IV SOLUTION
Freq: Once | INTRAVENOUS | Status: AC
  Administered 2019-07-07: 12:00:00 via INTRAVENOUS

## 2019-07-07 MED ORDER — ACETAMINOPHEN 650 MG RE SUPP
650.0000 mg | Freq: Four times a day (QID) | RECTAL | Status: DC | PRN
  Administered 2019-07-07: 650 mg via RECTAL
  Filled 2019-07-07: qty 1

## 2019-07-07 MED ORDER — LABETALOL HCL 5 MG/ML IV SOLN
10.0000 mg | INTRAVENOUS | Status: AC
  Administered 2019-07-07: 04:00:00 10 mg via INTRAVENOUS
  Filled 2019-07-07: qty 4

## 2019-07-07 MED ORDER — LORAZEPAM 2 MG/ML IJ SOLN
1.0000 mg | INTRAMUSCULAR | Status: DC | PRN
  Administered 2019-07-07: 10:00:00 1 mg via INTRAVENOUS

## 2019-07-07 MED ORDER — NALOXONE HCL 4 MG/0.1ML NA LIQD
1.0000 | Freq: Once | NASAL | Status: DC
  Filled 2019-07-07: qty 8

## 2019-07-07 MED ORDER — LABETALOL HCL 5 MG/ML IV SOLN
5.0000 mg | INTRAVENOUS | Status: DC | PRN
  Administered 2019-07-07 (×3): 10 mg via INTRAVENOUS
  Filled 2019-07-07 (×3): qty 4

## 2019-07-07 MED ORDER — HYDROMORPHONE HCL 1 MG/ML IJ SOLN
1.0000 mg | Freq: Once | INTRAMUSCULAR | Status: AC
  Administered 2019-07-07: 1 mg via INTRAVENOUS
  Filled 2019-07-07: qty 1

## 2019-07-07 MED ORDER — NALOXONE HCL 0.4 MG/ML IJ SOLN: 0.4000 mg | Freq: Once | INTRAMUSCULAR | Status: DC

## 2019-07-07 MED ORDER — PROCHLORPERAZINE EDISYLATE 10 MG/2ML IJ SOLN
10.0000 mg | Freq: Four times a day (QID) | INTRAMUSCULAR | Status: DC | PRN
  Administered 2019-07-07 – 2019-07-09 (×2): 10 mg via INTRAVENOUS
  Filled 2019-07-07 (×4): qty 2

## 2019-07-07 MED ORDER — SODIUM CHLORIDE 0.9% FLUSH
10.0000 mL | Freq: Two times a day (BID) | INTRAVENOUS | Status: DC
  Administered 2019-07-07 – 2019-07-12 (×9): 10 mL
  Administered 2019-07-12: 30 mL
  Administered 2019-07-13 – 2019-07-14 (×3): 10 mL

## 2019-07-07 MED ORDER — HYDRALAZINE HCL 20 MG/ML IJ SOLN
INTRAMUSCULAR | Status: AC
  Filled 2019-07-07: qty 1

## 2019-07-07 MED ORDER — SODIUM CHLORIDE 0.9% FLUSH: 10.0000 mL | INTRAVENOUS | Status: DC | PRN

## 2019-07-07 MED ORDER — LEVETIRACETAM IN NACL 1000 MG/100ML IV SOLN
1000.0000 mg | Freq: Once | INTRAVENOUS | Status: AC
  Administered 2019-07-07: 11:00:00 1000 mg via INTRAVENOUS
  Filled 2019-07-07: qty 100

## 2019-07-07 MED ORDER — ONDANSETRON HCL 4 MG PO TABS: 4.0000 mg | ORAL_TABLET | Freq: Four times a day (QID) | ORAL | Status: DC | PRN

## 2019-07-07 MED ORDER — ACETAMINOPHEN 325 MG PO TABS
650.0000 mg | ORAL_TABLET | Freq: Four times a day (QID) | ORAL | Status: DC | PRN
  Administered 2019-07-09: 05:00:00 650 mg via ORAL
  Filled 2019-07-07 (×2): qty 2

## 2019-07-07 MED ORDER — SODIUM CHLORIDE 0.9 % IV SOLN
INTRAVENOUS | Status: DC
  Administered 2019-07-07: 04:00:00 via INTRAVENOUS

## 2019-07-07 MED ORDER — NITROGLYCERIN 0.4 MG SL SUBL
0.4000 mg | SUBLINGUAL_TABLET | SUBLINGUAL | Status: DC | PRN
  Administered 2019-07-07: 08:00:00 0.4 mg via SUBLINGUAL

## 2019-07-07 MED ORDER — CHLORHEXIDINE GLUCONATE CLOTH 2 % EX PADS
6.0000 | MEDICATED_PAD | Freq: Every day | CUTANEOUS | Status: DC
  Administered 2019-07-07 – 2019-07-08 (×2): 6 via TOPICAL

## 2019-07-07 MED ORDER — MORPHINE SULFATE (PF) 2 MG/ML IV SOLN
2.0000 mg | INTRAVENOUS | Status: DC | PRN
  Administered 2019-07-07 – 2019-07-12 (×17): 2 mg via INTRAVENOUS
  Filled 2019-07-07 (×16): qty 1

## 2019-07-07 MED ORDER — LORAZEPAM 2 MG/ML IJ SOLN
INTRAMUSCULAR | Status: AC
  Filled 2019-07-07: qty 1

## 2019-07-07 MED ORDER — ONDANSETRON HCL 4 MG/2ML IJ SOLN
4.0000 mg | Freq: Four times a day (QID) | INTRAMUSCULAR | Status: DC | PRN
  Administered 2019-07-07 – 2019-07-08 (×4): 4 mg via INTRAVENOUS
  Filled 2019-07-07 (×5): qty 2

## 2019-07-07 MED ORDER — LEVETIRACETAM IN NACL 500 MG/100ML IV SOLN
500.0000 mg | Freq: Two times a day (BID) | INTRAVENOUS | Status: DC
  Administered 2019-07-07 – 2019-07-13 (×12): 500 mg via INTRAVENOUS
  Filled 2019-07-07 (×17): qty 100

## 2019-07-07 MED ORDER — SODIUM CHLORIDE 0.9% FLUSH
3.0000 mL | Freq: Two times a day (BID) | INTRAVENOUS | Status: DC
  Administered 2019-07-07: 3 mL via INTRAVENOUS

## 2019-07-07 MED ORDER — HYDRALAZINE HCL 20 MG/ML IJ SOLN
20.0000 mg | INTRAMUSCULAR | Status: DC | PRN
  Administered 2019-07-07 (×2): 20 mg via INTRAVENOUS
  Filled 2019-07-07: qty 1

## 2019-07-07 NOTE — ED Provider Notes (Signed)
-----------------------------------------   2:39 AM on 07/07/2019 -----------------------------------------  Patient's admission had been on hold pending repeat platelet count and laboratory evaluation of smear to evaluate for schistocytes.  Laboratory just called and gave a verbal report that patient does not have schistocytes on her peripheral smear, therefore she will not require transfer to a tertiary care center for plasmapheresis.  I have let the hospitalist Dr. Marcille Blanco know this information.  She is still hurting; will administer 1 mg IV Dilaudid for pain.   Paulette Blanch, MD 07/07/19 [id number]
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
## 2019-07-07 NOTE — Consult Note (Addendum)
Reason for Consult:Seizure Referring Physician: Gouru  CC: Seizure  HPI: Sarah Serrano is an 43 y.o. female who is unable to provide any history due to mental status therefore all history obtained from the chart.  Patient with a history of ETOH abuse.  Presented to the ED this morning with complaint of 24 hours of nausea and vomiting.  BP elevated.  Was found to have pancreatitis and thrombocytopenia.  Admitted for further management.  Once admitted noted to have 4 seizures.  All less than 30 seconds.  Described as a GTC seizure.  Associated tongue biting.  Patient currently post-ictal.    Past Medical History:  Diagnosis Date  . Alcohol abuse   . Bipolar 1 disorder ([place])   . Substance abuse [facility])     Past Surgical History:  Procedure Laterality Date  . NO PAST SURGERIES      Family History  Problem Relation Age of Onset  . Bipolar disorder Mother     Social History:  reports that she has been smoking cigarettes. She has been smoking about 0.50 packs per day. She has never used smokeless tobacco. She reports current alcohol use. She reports current drug use.  No Known Allergies  Medications:  I have reviewed the patient's current medications. Prior to Admission:  Medications Prior to Admission  Medication Sig Dispense Refill Last Dose  . naproxen (NAPROSYN) 500 MG tablet Take 1 tablet (500 mg total) by mouth 2 (two) times daily with a meal. 30 tablet 0    Scheduled: . sodium chloride   Intravenous Once  . Chlorhexidine Gluconate Cloth  6 each Topical Daily  . hydrALAZINE      . naloxone  1 spray Nasal Once  . nitroGLYCERIN  0.5 inch Topical Q6H  . nitroGLYCERIN      . sodium chloride flush  10-40 mL Intracatheter Q12H  . sodium chloride flush  3 mL Intravenous Q12H    ROS: Unable to obtain due to mental status  Physical Examination: Blood pressure (!) 166/110, pulse 98, temperature (!) 96.6 F (35.9 C), temperature source Axillary, resp. rate 20, height 5\' 4"   (1.626 m), weight 74.2 kg, last menstrual period 07/05/2019, SpO2 95 %.  HEENT-  Normocephalic, no lesions, without obvious abnormality.  Blood in mouth Cardiovascular- S1, S2 normal, pulses palpable throughout   Lungs- chest clear, no wheezing, rales, normal symmetric air entry Abdomen- soft, non-tender; bowel sounds normal; no masses,  no organomegaly Extremities- no edema Lymph-no adenopathy palpable Musculoskeletal-no joint tenderness, deformity or swelling Skin-warm and dry, no hyperpigmentation, vitiligo, or suspicious lesions  Neurological Examination   Mental Status: Patient does not respond to verbal stimuli.  Does not follow commands.  No verbalizations noted.  Cranial Nerves: II: patient does not respond confrontation bilaterally, pupils right 5 mm, left 5 mm,and reactive bilaterally III,IV,VI: Oculocephalic response present bilaterally.  V,VII: corneal reflex present bilaterally  VIII: patient does not respond to verbal stimuli IX,X: gag reflex unable to test, XI: trapezius strength unable to test bilaterally XII: tongue strength unable to test Motor: Moves all extremities spontaneously Sensory: Withdraws to noxious stimuli in all extremities. Deep Tendon Reflexes:  Symmetric throughout. Plantars: equivocal bilaterally Cerebellar: Unable to perform  Laboratory Studies:   Basic Metabolic Panel: Recent Labs  Lab 07/06/19 1817  NA 136  K 3.5  CL 94*  CO2 22  GLUCOSE 121*  BUN 10  CREATININE 0.40*  CALCIUM 9.0    Liver Function Tests: Recent Labs  Lab 07/06/19 1817  AST  77*  ALT 21  ALKPHOS 73  BILITOT 1.1  PROT 8.3*  ALBUMIN 3.5   Recent Labs  Lab 07/06/19 1817  LIPASE 165*   No results for input(s): AMMONIA in the last 168 hours.  CBC: Recent Labs  Lab 07/06/19 1741 07/07/19 0001 07/07/19 0855  WBC 5.0 4.6  --   NEUTROABS 3.6  --   --   HGB 12.5 12.6  --   HCT 35.2* 35.1*  --   MCV 88.4 88.4  --   PLT 12* 9* 5*    Cardiac  Enzymes: No results for input(s): CKTOTAL, CKMB, CKMBINDEX, TROPONINI in the last 168 hours.  BNP: Invalid input(s): POCBNP  CBG: Recent Labs  Lab 07/07/19 0814 07/07/19 1013  GLUCAP 278* 170*    Microbiology: Results for orders placed or performed during the hospital encounter of 07/06/19  SARS CORONAVIRUS 2 (TAT 6-24 HRS) Nasopharyngeal Nasopharyngeal Swab     Status: None   Collection Time: 07/07/19  1:34 AM   Specimen: Nasopharyngeal Swab  Result Value Ref Range Status   SARS Coronavirus 2 NEGATIVE NEGATIVE Final    Comment: (NOTE) SARS-CoV-2 target nucleic acids are NOT DETECTED. The SARS-CoV-2 RNA is generally detectable in upper and lower respiratory specimens during the acute phase of infection. Negative results do not preclude SARS-CoV-2 infection, do not rule out co-infections with other pathogens, and should not be used as the sole basis for treatment or other patient management decisions. Negative results must be combined with clinical observations, patient history, and epidemiological information. The expected result is Negative. Fact Sheet for Patients: SugarRoll.be Fact Sheet for Healthcare Providers: https://www.woods-mathews.com/ This test is not yet approved or cleared by the [place] FDA and  has been authorized for detection and/or diagnosis of SARS-CoV-2 by FDA under an Emergency Use Authorization (EUA). This EUA will remain  in effect (meaning this test can be used) for the duration of the COVID-19 declaration under Section 56 4(b)(1) of the Act, 21 U.S.C. section 360bbb-3(b)(1), unless the authorization is terminated or revoked sooner. Performed at [facility], [place] [address] [place], [place] [address]     Coagulation Studies: No results for input(s): LABPROT, INR in the last 72 hours.  Urinalysis:  Recent Labs  Lab 07/07/19 0548  COLORURINE AMBER*  LABSPEC >1.046*  PHURINE 6.0   GLUCOSEU NEGATIVE  HGBUR LARGE*  BILIRUBINUR SMALL*  KETONESUR NEGATIVE  PROTEINUR 100*  NITRITE NEGATIVE  LEUKOCYTESUR NEGATIVE    Lipid Panel:     Component Value Date/Time   CHOL 131 11/22/2013 0354   TRIG 97 11/22/2013 0354   HDL 35 (L) 11/22/2013 0354   VLDL 19 11/22/2013 0354   LDLCALC 77 11/22/2013 0354    HgbA1C:  Lab Results  Component Value Date   HGBA1C 5.1 11/22/2013    Urine Drug Screen:      Component Value Date/Time   LABOPIA POSITIVE (A) 07/07/2019 0548   COCAINSCRNUR NONE DETECTED 07/07/2019 0548   LABBENZ NONE DETECTED 07/07/2019 0548   AMPHETMU NONE DETECTED 07/07/2019 0548   THCU NONE DETECTED 07/07/2019 0548   LABBARB NONE DETECTED 07/07/2019 0548    Alcohol Level:  Recent Labs  Lab 07/06/19 1817  ETH 43*    Other results: EKG: sinus tachycardia at 126 bpm.  Imaging: Ct Head Wo Contrast  Result Date: 07/07/2019 CLINICAL DATA:  Patient found down this morning after a fall. Initial encounter. EXAM: CT HEAD WITHOUT CONTRAST TECHNIQUE: Contiguous axial images were obtained from the base of  the skull through the vertex without intravenous contrast. COMPARISON:  Head CT scan 07/16/2014. FINDINGS: Brain: No evidence of acute infarction, hemorrhage, hydrocephalus, extra-axial collection or mass lesion/mass effect. A few calcifications in the basal ganglia bilaterally are unchanged. Vascular: No hyperdense vessel or unexpected calcification. Skull: Intact.  No focal lesion. Sinuses/Orbits: Negative. Other: None. IMPRESSION: Negative head CT. Electronically Signed   By: Inge Rise M.D.   On: 07/07/2019 09:08   Ct Abdomen Pelvis W Contrast  Result Date: 07/06/2019 CLINICAL DATA:  Acute, generalized abdominal pain and vomiting. Shortness of breath. EXAM: CT ABDOMEN AND PELVIS WITH CONTRAST TECHNIQUE: Multidetector CT imaging of the abdomen and pelvis was performed using the standard protocol following bolus administration of intravenous contrast.  CONTRAST:  192mL OMNIPAQUE IOHEXOL 300 MG/ML  SOLN COMPARISON:  07/16/2014. FINDINGS: Lower chest: Unremarkable. Hepatobiliary: Marked diffuse low density of the liver relative to the spleen. The liver is enlarged, most pronounced involving the lateral segment left lobe and caudate lobe. Mild diffuse gallbladder wall enhancement without thickening or pericholecystic fluid. Pancreas: Diffuse peripancreatic soft tissue stranding and edema. This tracks superiorly and anteriorly. No discrete fluid collections are seen. Spleen: Normal in size without focal abnormality. Adrenals/Urinary Tract: Adrenal glands are unremarkable. Kidneys are normal, without renal calculi, focal lesion, or hydronephrosis. Bladder is unremarkable. Stomach/Bowel: Mild low density distal gastric wall thickening. Normal appearing appendix, small bowel and colon. Vascular/Lymphatic: No significant vascular findings are present. No enlarged abdominal or pelvic lymph nodes. Reproductive: Enlarged, lobulated uterus containing coarse calcifications. Previously, with intravenous contrast, there were corresponding uterine masses. A simple appearing left ovarian cyst has not changed significantly, measuring 4.4 x 2.3 cm today on image number 62 series 2, previously 3.8 x 2.4 cm. Normal appearing right ovary containing a small follicular cyst. Other: Tiny umbilical hernia containing fat. Musculoskeletal: Unremarkable bones. IMPRESSION: 1. Acute pancreatitis without abscess. 2. Mild low density distal gastric wall thickening, most likely secondary to the acute pancreatitis. Concomitant acute gastritis is less likely. 3. Marked diffuse hepatic steatosis. 4. Hepatomegaly. 5. Stable simple appearing left ovarian cyst. Electronically Signed   By: Claudie Revering M.D.   On: 07/06/2019 21:26   Dg Chest Portable 1 View  Result Date: 07/06/2019 CLINICAL DATA:  PT to ER with c/o vomiting that started last night. Pt states had chest pressure that started prior to  vomiting, and now she describes it as hurting. Pt actively vomiting in triage, diaphoretic. Pt states SHOB. EXAM: PORTABLE CHEST 1 VIEW COMPARISON:  Chest radiographs 11/22/2013, 02/16/2012 FINDINGS: Stable cardiomediastinal contours within normal limits. The lungs are clear. No pneumothorax or large pleural effusion. No acute finding in the visualized skeleton. IMPRESSION: No evidence of active disease. Electronically Signed   By: Audie Pinto M.D.   On: 07/06/2019 17:53     Assessment/Plan: 43 year old female with a history of ETOH abuse presenting with nausea and vomiting.  Found to have pancreatitis and thrombocytopenia.  After admission noted to have multiple GTC seizures.  No history of seizures in patient's records.  Although seizures may be related to ETOH abuse with investigate for further etiologies (hypertension, etc.).  Head CT reviewed and shows no acute changes.    Recommendations: 1. MRI of the brain with and without contrast 2. EEG 3. Continue Keppra at 500mg  q 12 hours 4. Seizure precautions 5. CIWA protocol 6. BP control.  Target SBP<160.   7. Serum magnesium, NH4 and phosphorus  Alexis Goodell, MD Neurology [phone] 07/07/2019, 10:55 AM

## 2019-07-07 NOTE — Progress Notes (Signed)
Patient originally called for first rapid approximately 8am- for unwitnessed fall/lethargy- patient was arousable and brought patient for CT of the head.  2nd call for for a witnessed seizure and then 3rd call was for another seizure- patient given 1mg  of ativan and 10mg  of labetalol- for systolic of [id number]- patient NSR on heart monitor and O2 saturation 100% on room air.  Dr Doy Mince with neurology at bedside.  Patient transferred to ICU.

## 2019-07-07 NOTE — Progress Notes (Signed)
Unwitnessed fall at 0740. Patient found by NT on floor sitting on bottom. Patient is alert and oriented at this time and is complaining of abdominal pain.  Patient placed on BSC, NT at patients side. Left room to complete fall huddle.  76 Called by NT as patient is not responding, drooling and bilateral pupils dilated.   Patient placed in bed and rapid response called.   Primary RN made aware and now at bedside.   Madlyn Frankel, RN

## 2019-07-07 NOTE — Plan of Care (Signed)
Starting at 0800. Patient found on floor between bed and bathroom (unwitnessed fall).  Patient was unresponsive, eyes rolled back, unable to control saliva. - Lifted to bed, suctioned, IV access lost during fall. BP 166/11.  Unable to give PRN labetolol, d/t loss of access.  Rapid called. Dr. Notified/returned call.  Stat CT head,IV team to place line plus  Intral nasal narcan and Drug screen.  By 1000, patient had another 3 seizures (all witnessed). Midline placed, 1mg  ativan ordered/given and labetolol PRN pushed.  BP continued to climb till given) Seizures usually lasted 30-45 sec with similar symptoms.  Neurology rounded  - Transferred to ICU rm 2.  Report given at bedside.

## 2019-07-07 NOTE — H&P (Signed)
Sarah Serrano is an 43 y.o. female.   Chief Complaint: Vomiting HPI: The patient with past medical history of alcohol abuse presents to the emergency department complaining of nausea and vomiting.  The patient reports that her symptoms began approximately 24 hours ago and have worsened.  She has had multiple episodes of nonbloody nonbilious emesis.  Now her abdomen hurts and radiates into her chest.  Laboratory evaluation significant for mildly elevated ethanol level, elevated liver enzymes and severe thrombocytopenia.  Blood smear was found to be schistocytes negative.  Once the patient's nausea and pain were managed the emergency department staff called the hospitalist service for admission.  Past Medical History:  Diagnosis Date  . Alcohol abuse   . Bipolar 1 disorder ([place])   . Substance abuse [facility])     Past Surgical History:  Procedure Laterality Date  . NO PAST SURGERIES      Family History  Problem Relation Age of Onset  . Bipolar disorder Mother    Social History:  reports that she has been smoking cigarettes. She has been smoking about 0.50 packs per day. She has never used smokeless tobacco. She reports current alcohol use. She reports current drug use.  Allergies: No Known Allergies  Medications Prior to Admission  Medication Sig Dispense Refill  . naproxen (NAPROSYN) 500 MG tablet Take 1 tablet (500 mg total) by mouth 2 (two) times daily with a meal. 30 tablet 0    Results for orders placed or performed during the hospital encounter of 07/06/19 (from the past 48 hour(s))  CBC with Differential     Status: Abnormal   Collection Time: 07/06/19  5:41 PM  Result Value Ref Range   WBC 5.0 4.0 - 10.5 K/uL   RBC 3.98 3.87 - 5.11 MIL/uL   Hemoglobin 12.5 12.0 - 15.0 g/dL   HCT 35.2 (L) 36.0 - 46.0 %   MCV 88.4 80.0 - 100.0 fL   MCH 31.4 26.0 - 34.0 pg   MCHC 35.5 30.0 - 36.0 g/dL   RDW 16.8 (H) 11.5 - 15.5 %   Platelets 12 (LL) 150 - 400 K/uL    Comment: Immature  Platelet Fraction may be clinically indicated, consider ordering this additional test [id number] THIS CRITICAL RESULT HAS VERIFIED AND BEEN CALLED TO KATIE FERGUSON BY TORITSEJU KPENOSEN ON 09 06 2020 AT 1849, AND HAS BEEN READ BACK.     nRBC 0.6 (H) 0.0 - 0.2 %   Neutrophils Relative % 73 %   Neutro Abs 3.6 1.7 - 7.7 K/uL   Lymphocytes Relative 25 %   Lymphs Abs 1.3 0.7 - 4.0 K/uL   Monocytes Relative 1 %   Monocytes Absolute 0.1 0.1 - 1.0 K/uL   Eosinophils Relative 0 %   Eosinophils Absolute 0.0 0.0 - 0.5 K/uL   Basophils Relative 1 %   Basophils Absolute 0.0 0.0 - 0.1 K/uL   WBC Morphology MORPHOLOGY UNREMARKABLE    Smear Review PLATELETS APPEAR DECREASED     Comment: PLATELET COUNT CONFIRMED BY SMEAR   Immature Granulocytes 0 %   Abs Immature Granulocytes 0.01 0.00 - 0.07 K/uL   Stomatocytes PRESENT     Comment: Performed at [facility], [address], [place], [place] [address]  Comprehensive metabolic panel     Status: Abnormal   Collection Time: 07/06/19  6:17 PM  Result Value Ref Range   Sodium 136 135 - 145 mmol/L    Comment: ELECTROLYTES  CONFIRMED.MSS   Potassium 3.5 3.5 -  5.1 mmol/L   Chloride 94 (L) 98 - 111 mmol/L   CO2 22 22 - 32 mmol/L   Glucose, Bld 121 (H) 70 - 99 mg/dL   BUN 10 6 - 20 mg/dL   Creatinine, Ser 0.40 (L) 0.44 - 1.00 mg/dL   Calcium 9.0 8.9 - 10.3 mg/dL   Total Protein 8.3 (H) 6.5 - 8.1 g/dL   Albumin 3.5 3.5 - 5.0 g/dL   AST 77 (H) 15 - 41 U/L   ALT 21 0 - 44 U/L   Alkaline Phosphatase 73 38 - 126 U/L   Total Bilirubin 1.1 0.3 - 1.2 mg/dL   GFR calc non Af Amer >60 >60 mL/min   GFR calc Af Amer >60 >60 mL/min   Anion gap 20 (H) 5 - 15    Comment: Performed at [facility], [address] [place], [place] [address]  hCG, quantitative, pregnancy     Status: None   Collection Time: 07/06/19  6:17 PM  Result Value Ref Range   hCG, Beta Chain, Quant, S <1 <5 mIU/mL    Comment:          GEST. AGE      CONC.  (mIU/mL)    <=1 WEEK        5 - 50     2 WEEKS       50 - 500     3 WEEKS       100 - 10,000     4 WEEKS     1,000 - 30,000     5 WEEKS     3,500 - 115,000   6-8 WEEKS     12,000 - 270,000    12 WEEKS     15,000 - 220,000        FEMALE AND NON-PREGNANT FEMALE:     LESS THAN 5 mIU/mL Performed at [facility], [address] [place], [place] [address]   Lipase, blood     Status: Abnormal   Collection Time: 07/06/19  6:17 PM  Result Value Ref Range   Lipase 165 (H) 11 - 51 U/L    Comment: Performed at [facility], [place], [place] [address]  Troponin I (High Sensitivity)     Status: None   Collection Time: 07/06/19  6:17 PM  Result Value Ref Range   Troponin I (High Sensitivity) 9 <18 ng/L    Comment: (NOTE) Elevated high sensitivity troponin I (hsTnI) values and significant  changes across serial measurements may suggest ACS but many other  chronic and acute conditions are known to elevate hsTnI results.  Refer to the Links section for chest pain algorithms and additional  guidance. Performed at [facility], [address] [place], [place] [address]   Ethanol     Status: Abnormal   Collection Time: 07/06/19  6:17 PM  Result Value Ref Range   Alcohol, Ethyl (B) 43 (H) <10 mg/dL    Comment: (NOTE) Lowest detectable limit for serum alcohol is 10 mg/dL. For medical purposes only. Performed at [facility], [address] [place], [place] [address]   CBC     Status: Abnormal   Collection Time: 07/07/19 12:01 AM  Result Value Ref Range   WBC 4.6 4.0 - 10.5 K/uL   RBC 3.97 3.87 - 5.11 MIL/uL   Hemoglobin 12.6 12.0 - 15.0 g/dL   HCT 35.1 (L) 36.0 - 46.0 %   MCV 88.4 80.0 - 100.0 fL  MCH 31.7 26.0 - 34.0 pg   MCHC 35.9 30.0 - 36.0 g/dL   RDW 16.7 (H) 11.5 - 15.5 %   Platelets 9 (LL) 150 - 400 K/uL    Comment: Immature Platelet Fraction may be clinically indicated, consider ordering this additional test [id number] THIS  CRITICAL RESULT HAS VERIFIED AND BEEN CALLED TO TOM NAGY BY CHERYL FLORENCE ON 09 07 2020 AT 0037, AND HAS BEEN READ BACK.     nRBC 0.7 (H) 0.0 - 0.2 %    Comment: Performed at [facility], [address] [place], [place] [address]   Ct Abdomen Pelvis W Contrast  Result Date: 07/06/2019 CLINICAL DATA:  Acute, generalized abdominal pain and vomiting. Shortness of breath. EXAM: CT ABDOMEN AND PELVIS WITH CONTRAST TECHNIQUE: Multidetector CT imaging of the abdomen and pelvis was performed using the standard protocol following bolus administration of intravenous contrast. CONTRAST:  136mL OMNIPAQUE IOHEXOL 300 MG/ML  SOLN COMPARISON:  07/16/2014. FINDINGS: Lower chest: Unremarkable. Hepatobiliary: Marked diffuse low density of the liver relative to the spleen. The liver is enlarged, most pronounced involving the lateral segment left lobe and caudate lobe. Mild diffuse gallbladder wall enhancement without thickening or pericholecystic fluid. Pancreas: Diffuse peripancreatic soft tissue stranding and edema. This tracks superiorly and anteriorly. No discrete fluid collections are seen. Spleen: Normal in size without focal abnormality. Adrenals/Urinary Tract: Adrenal glands are unremarkable. Kidneys are normal, without renal calculi, focal lesion, or hydronephrosis. Bladder is unremarkable. Stomach/Bowel: Mild low density distal gastric wall thickening. Normal appearing appendix, small bowel and colon. Vascular/Lymphatic: No significant vascular findings are present. No enlarged abdominal or pelvic lymph nodes. Reproductive: Enlarged, lobulated uterus containing coarse calcifications. Previously, with intravenous contrast, there were corresponding uterine masses. A simple appearing left ovarian cyst has not changed significantly, measuring 4.4 x 2.3 cm today on image number 62 series 2, previously 3.8 x 2.4 cm. Normal appearing right ovary containing a small follicular cyst. Other: Tiny umbilical hernia  containing fat. Musculoskeletal: Unremarkable bones. IMPRESSION: 1. Acute pancreatitis without abscess. 2. Mild low density distal gastric wall thickening, most likely secondary to the acute pancreatitis. Concomitant acute gastritis is less likely. 3. Marked diffuse hepatic steatosis. 4. Hepatomegaly. 5. Stable simple appearing left ovarian cyst. Electronically Signed   By: Claudie Revering M.D.   On: 07/06/2019 21:26   Dg Chest Portable 1 View  Result Date: 07/06/2019 CLINICAL DATA:  PT to ER with c/o vomiting that started last night. Pt states had chest pressure that started prior to vomiting, and now she describes it as hurting. Pt actively vomiting in triage, diaphoretic. Pt states SHOB. EXAM: PORTABLE CHEST 1 VIEW COMPARISON:  Chest radiographs 11/22/2013, 02/16/2012 FINDINGS: Stable cardiomediastinal contours within normal limits. The lungs are clear. No pneumothorax or large pleural effusion. No acute finding in the visualized skeleton. IMPRESSION: No evidence of active disease. Electronically Signed   By: Audie Pinto M.D.   On: 07/06/2019 17:53    Review of Systems  Constitutional: Negative for chills and fever.  HENT: Negative for sore throat and tinnitus.   Eyes: Negative for blurred vision and redness.  Respiratory: Negative for cough and shortness of breath.   Cardiovascular: Positive for chest pain. Negative for palpitations, orthopnea and PND.  Gastrointestinal: Positive for abdominal pain, nausea and vomiting. Negative for diarrhea.  Genitourinary: Negative for dysuria, frequency and urgency.  Musculoskeletal: Negative for joint pain and myalgias.  Skin: Negative for rash.       No lesions  Neurological: Negative for speech  change, focal weakness and weakness.  Endo/Heme/Allergies: Does not bruise/bleed easily.       No temperature intolerance  Psychiatric/Behavioral: Negative for depression and suicidal ideas.    Blood pressure (!) 189/102, pulse (!) 115, temperature 97.7 F  (36.5 C), temperature source Oral, resp. rate 18, height 5\' 4"  (1.626 m), weight 68 kg, last menstrual period 07/05/2019, SpO2 98 %. Physical Exam  Vitals reviewed. Constitutional: She is oriented to person, place, and time. She appears well-developed and well-nourished. No distress.  HENT:  Head: Normocephalic and atraumatic.  Mouth/Throat: Oropharynx is clear and moist.  Eyes: Pupils are equal, round, and reactive to light. Conjunctivae and EOM are normal. No scleral icterus.  Neck: Normal range of motion. Neck supple. No JVD present. No tracheal deviation present. No thyromegaly present.  Cardiovascular: Normal rate, regular rhythm and normal heart sounds. Exam reveals no gallop and no friction rub.  No murmur heard. Respiratory: Effort normal and breath sounds normal.  GI: Soft. Bowel sounds are normal. She exhibits no distension and no mass. There is abdominal tenderness. There is no rebound and no guarding.  Genitourinary:    Genitourinary Comments: Deferred   Musculoskeletal: Normal range of motion.        General: No edema.  Lymphadenopathy:    She has no cervical adenopathy.  Neurological: She is alert and oriented to person, place, and time. No cranial nerve deficit. She exhibits normal muscle tone.  Skin: Skin is warm and dry. No rash noted. No erythema.  Psychiatric: She has a normal mood and affect. Her behavior is normal. Judgment and thought content normal.     Assessment/Plan This is a 43 year old female admitted for pancreatitis. 1.  Pancreatitis: Etiology is likely chronic alcohol abuse and/or passed gallstone.  Liver enzymes elevated.  No abscess or stone seen on CT scan.  N.p.o. for now.  Manage severe pain with IV morphine.  Blood pressure elevated due to pain.  Nitro paste applied.  Labetalol as needed. 2.  Thrombocytopenia: No schistocytes.  Differential diagnosis includes ITP and/or chronic suppression secondary to alcohol abuse.  Consult oncology. 3.  Alcohol  abuse: The patient is not currently intoxicated but monitor for signs or symptoms of withdrawal in 48 hours. 4.  Overweight: BMI is 25.7; encouraged healthy diet and exercise 5.  DVT prophylaxis: SCDs 6.  GI prophylaxis: None The patient is a full code.  Time spent on admission orders and patient care approximately 45 minutes  Harrie Foreman, MD 07/07/2019, 4:52 AM

## 2019-07-07 NOTE — Consult Note (Signed)
Hematology/Oncology Consult note [facility] Telephone:([phone] [phone]  Patient Care Team: Patient, No Pcp Per as PCP - General (General Practice)   Name of the patient: Sarah Serrano  [id number]  1976-08-28    Reason for referral- thrombocytopenia   Referring physician- Dr. Marcille Blanco  Date of visit: 07/07/2019    History of presenting illness- Patient is a 43 yr old african american female admitted to the hospital for acute onset abdominal pain, nausea and vomiting. She has h/o alcohol abuse and was drinking daily for last 2 weeks. CT abdomen shows features of acute pancreatitis. Platelets were  12 on admission and down to 9 today. Tech smear review showed no schistocytes. Bilirubin normal. No renal failure.  Prior to my bedside visit patient had an unwitnessed fall and underwent CT head which was unremarkable.    Review of systems- Review of Systems  Constitutional: Positive for malaise/fatigue. Negative for chills, fever and weight loss.  HENT: Negative for congestion, ear discharge and nosebleeds.   Eyes: Negative for blurred vision.  Respiratory: Negative for cough, hemoptysis, sputum production, shortness of breath and wheezing.   Cardiovascular: Negative for chest pain, palpitations, orthopnea and claudication.  Gastrointestinal: Positive for abdominal pain and nausea. Negative for blood in stool, constipation, diarrhea, heartburn, melena and vomiting.  Genitourinary: Negative for dysuria, flank pain, frequency, hematuria and urgency.  Musculoskeletal: Negative for back pain, joint pain and myalgias.  Skin: Negative for rash.  Neurological: Negative for dizziness, tingling, focal weakness, seizures, weakness and headaches.  Endo/Heme/Allergies: Does not bruise/bleed easily.  Psychiatric/Behavioral: Negative for depression and suicidal ideas. The patient does not have insomnia.     No Known Allergies  Patient Active Problem List   Diagnosis Date Noted   Pancreatitis 07/06/2019     Past Medical History:  Diagnosis Date   Alcohol abuse    Bipolar 1 disorder ([place])    Substance abuse ([place])      Past Surgical History:  Procedure Laterality Date   NO PAST SURGERIES      Social History   Socioeconomic History   Marital status: Single    Spouse name: Not on file   Number of children: Not on file   Years of education: Not on file   Highest education level: Not on file  Occupational History   Not on file  Social Needs   Financial resource strain: Not on file   Food insecurity    Worry: Not on file    Inability: Not on file   Transportation needs    Medical: Not on file    Non-medical: Not on file  Tobacco Use   Smoking status: Current Every Day Smoker    Packs/day: 0.50    Types: Cigarettes   Smokeless tobacco: Never Used  Substance and Sexual Activity   Alcohol use: Yes   Drug use: Yes   Sexual activity: Not on file  Lifestyle   Physical activity    Days per week: Not on file    Minutes per session: Not on file   Stress: Not on file  Relationships   Social connections    Talks on phone: Not on file    Gets together: Not on file    Attends religious service: Not on file    Active member of club or organization: Not on file    Attends meetings of clubs or organizations: Not on file    Relationship status: Not on file   Intimate partner violence  Fear of current or ex partner: Not on file    Emotionally abused: Not on file    Physically abused: Not on file    Forced sexual activity: Not on file  Other Topics Concern   Not on file  Social History Narrative   Not on file     Family History  Problem Relation Age of Onset   Bipolar disorder Mother      Current Facility-Administered Medications:    0.9 %  sodium chloride infusion, , Intravenous, Once, Harrie Foreman, MD   0.9 %  sodium chloride infusion, , Intravenous, Continuous, Harrie Foreman,  MD, Last Rate: 125 mL/hr at 07/07/19 0500   acetaminophen (TYLENOL) tablet 650 mg, 650 mg, Oral, Q6H PRN **OR** acetaminophen (TYLENOL) suppository 650 mg, 650 mg, Rectal, Q6H PRN, Harrie Foreman, MD, 650 mg at 07/07/19 0506   HYDROmorphone (DILAUDID) injection 0.5 mg, 0.5 mg, Intravenous, Q3H PRN, Harrie Foreman, MD, 0.5 mg at 07/07/19 0514   iohexol (OMNIPAQUE) 9 MG/ML oral solution 500 mL, 500 mL, Oral, BID PRN, Harrie Foreman, MD, 500 mL at 07/06/19 1745   labetalol (NORMODYNE) injection 5-10 mg, 5-10 mg, Intravenous, Q2H PRN, Harrie Foreman, MD   morphine 2 MG/ML injection 2 mg, 2 mg, Intravenous, Q4H PRN, Harrie Foreman, MD, 2 mg at 07/07/19 0421   naloxone [facility]) nasal spray 4 mg/0.1 mL, 1 spray, Nasal, Once, Gouru, Aruna, MD, Stopped at 07/07/19 0830   nitroGLYCERIN (NITROGLYN) 2 % ointment 0.5 inch, 0.5 inch, Topical, Q6H, Harrie Foreman, MD, 0.5 inch at 07/07/19 0515   nitroGLYCERIN (NITROSTAT) 0.4 MG SL tablet, , , ,    nitroGLYCERIN (NITROSTAT) SL tablet 0.4 mg, 0.4 mg, Sublingual, Q5 min PRN, Gouru, Aruna, MD, 0.4 mg at 07/07/19 0815   ondansetron (ZOFRAN) tablet 4 mg, 4 mg, Oral, Q6H PRN **OR** ondansetron (ZOFRAN) injection 4 mg, 4 mg, Intravenous, Q6H PRN, Harrie Foreman, MD, 4 mg at 07/07/19 0422   prochlorperazine (COMPAZINE) injection 10 mg, 10 mg, Intravenous, Q6H PRN, Harrie Foreman, MD   Physical exam:  Vitals:   07/07/19 0742 07/07/19 0758 07/07/19 0801 07/07/19 0820  BP: (!) 163/112  (!) 213/109 (!) 166/111  Pulse: 93  (!) 108   Resp:      Temp:      TempSrc:      SpO2: 100% 100% 100%   Weight:      Height:       Physical Exam Constitutional:      Comments: Appears in mild distress from abdominal pain  HENT:     Head: Normocephalic and atraumatic.  Eyes:     Pupils: Pupils are equal, round, and reactive to light.  Neck:     Musculoskeletal: Normal range of motion.  Cardiovascular:     Rate and Rhythm: Normal rate  and regular rhythm.     Heart sounds: Normal heart sounds.  Pulmonary:     Effort: Pulmonary effort is normal.     Breath sounds: Normal breath sounds.  Abdominal:     General: Bowel sounds are normal.     Palpations: Abdomen is soft.  Skin:    General: Skin is warm and dry.  Neurological:     General: No focal deficit present.     Mental Status: She is alert and oriented to person, place, and time.  Psychiatric:        Mood and Affect: Mood is anxious.  CMP Latest Ref Rng & Units 07/06/2019  Glucose 70 - 99 mg/dL 121(H)  BUN 6 - 20 mg/dL 10  Creatinine 0.44 - 1.00 mg/dL 0.40(L)  Sodium 135 - 145 mmol/L 136  Potassium 3.5 - 5.1 mmol/L 3.5  Chloride 98 - 111 mmol/L 94(L)  CO2 22 - 32 mmol/L 22  Calcium 8.9 - 10.3 mg/dL 9.0  Total Protein 6.5 - 8.1 g/dL 8.3(H)  Total Bilirubin 0.3 - 1.2 mg/dL 1.1  Alkaline Phos 38 - 126 U/L 73  AST 15 - 41 U/L 77(H)  ALT 0 - 44 U/L 21   CBC Latest Ref Rng & Units 07/07/2019  WBC 4.0 - 10.5 K/uL 4.6  Hemoglobin 12.0 - 15.0 g/dL 12.6  Hematocrit 36.0 - 46.0 % 35.1(L)  Platelets 150 - 400 K/uL 9(LL)    @IMAGES @  Ct Head Wo Contrast  Result Date: 07/07/2019 CLINICAL DATA:  Patient found down this morning after a fall. Initial encounter. EXAM: CT HEAD WITHOUT CONTRAST TECHNIQUE: Contiguous axial images were obtained from the base of the skull through the vertex without intravenous contrast. COMPARISON:  Head CT scan 07/16/2014. FINDINGS: Brain: No evidence of acute infarction, hemorrhage, hydrocephalus, extra-axial collection or mass lesion/mass effect. A few calcifications in the basal ganglia bilaterally are unchanged. Vascular: No hyperdense vessel or unexpected calcification. Skull: Intact.  No focal lesion. Sinuses/Orbits: Negative. Other: None. IMPRESSION: Negative head CT. Electronically Signed   By: Inge Rise M.D.   On: 07/07/2019 09:08   Ct Abdomen Pelvis W Contrast  Result Date: 07/06/2019 CLINICAL DATA:  Acute, generalized  abdominal pain and vomiting. Shortness of breath. EXAM: CT ABDOMEN AND PELVIS WITH CONTRAST TECHNIQUE: Multidetector CT imaging of the abdomen and pelvis was performed using the standard protocol following bolus administration of intravenous contrast. CONTRAST:  172mL OMNIPAQUE IOHEXOL 300 MG/ML  SOLN COMPARISON:  07/16/2014. FINDINGS: Lower chest: Unremarkable. Hepatobiliary: Marked diffuse low density of the liver relative to the spleen. The liver is enlarged, most pronounced involving the lateral segment left lobe and caudate lobe. Mild diffuse gallbladder wall enhancement without thickening or pericholecystic fluid. Pancreas: Diffuse peripancreatic soft tissue stranding and edema. This tracks superiorly and anteriorly. No discrete fluid collections are seen. Spleen: Normal in size without focal abnormality. Adrenals/Urinary Tract: Adrenal glands are unremarkable. Kidneys are normal, without renal calculi, focal lesion, or hydronephrosis. Bladder is unremarkable. Stomach/Bowel: Mild low density distal gastric wall thickening. Normal appearing appendix, small bowel and colon. Vascular/Lymphatic: No significant vascular findings are present. No enlarged abdominal or pelvic lymph nodes. Reproductive: Enlarged, lobulated uterus containing coarse calcifications. Previously, with intravenous contrast, there were corresponding uterine masses. A simple appearing left ovarian cyst has not changed significantly, measuring 4.4 x 2.3 cm today on image number 62 series 2, previously 3.8 x 2.4 cm. Normal appearing right ovary containing a small follicular cyst. Other: Tiny umbilical hernia containing fat. Musculoskeletal: Unremarkable bones. IMPRESSION: 1. Acute pancreatitis without abscess. 2. Mild low density distal gastric wall thickening, most likely secondary to the acute pancreatitis. Concomitant acute gastritis is less likely. 3. Marked diffuse hepatic steatosis. 4. Hepatomegaly. 5. Stable simple appearing left ovarian  cyst. Electronically Signed   By: Claudie Revering M.D.   On: 07/06/2019 21:26   Dg Chest Portable 1 View  Result Date: 07/06/2019 CLINICAL DATA:  PT to ER with c/o vomiting that started last night. Pt states had chest pressure that started prior to vomiting, and now she describes it as hurting. Pt actively vomiting in triage, diaphoretic. Pt states SHOB. EXAM:  PORTABLE CHEST 1 VIEW COMPARISON:  Chest radiographs 11/22/2013, 02/16/2012 FINDINGS: Stable cardiomediastinal contours within normal limits. The lungs are clear. No pneumothorax or large pleural effusion. No acute finding in the visualized skeleton. IMPRESSION: No evidence of active disease. Electronically Signed   By: Audie Pinto M.D.   On: 07/06/2019 17:53    Assessment and plan- Patient is a 43 y.o. female with alcohol induced pancreatitis. Hematology consulted for acute thrombocytopenia  Severe thrombocytopenia. Differential includes:  1. Alcohol induced bone marrow suppression in addition to acute pancreatitis. 2. Acute pancreatitis induced TTP- no renal failure, no fever. I have personally reviewed her peripheral smear and I do not see any evidence of schistocytes. Platelets markedly decreased in number. RBC's appear normal in morphology Bilirubin is normal as well. TTP is unlikely. Check daily smear review.  Check LDH, Haptoglobin and retic count.  3. ITP- prior platelet counts normal. No baseline thrombocytopenia. Will hold off on steroids given acute pancreatitis 4. DIC- this is a possibility due to acute pancreatitis. check fibrinogen, PT, PTT, INR 5. No recent transfions. teherfore drug induced ITP and PTP are not in the differential.  After my bedside assessment, patient began to have seizure like episodes. alcohol withdrawal seizures is a possibility.  OK to transfuse 1 unit of platelets today. If she is transfused, please check post transfusion platelet count within 30 min of transfusion   Thank you for this kind  referral and the opportunity to participate in the care of this patient   Visit Diagnosis:  1. Severe thrombocytopenia   Dr. Randa Evens, MD, MPH [facility] [id number] 07/07/2019  9:35 AM

## 2019-07-07 NOTE — ED Notes (Signed)
ED TO INPATIENT HANDOFF REPORT  ED Nurse Name and Phone #: Drue Novel Name/Age/Gender Sarah Serrano 43 y.o. female Room/Bed: ED13A/ED13A  Code Status   Code Status: Not on file  Home/SNF/Other Home Patient oriented to: self, place, time and situation Is this baseline? Yes   Triage Complete: Triage complete  Chief Complaint Chest Pain/Weakness  Triage Note PT to ER with c/o vomiting that started last night.  Pt states had chest pressure that started prior to vomiting, and now she describes it as hurting.  Pt actively vomiting in triage, diaphoretic.  Pt states SHOB.  Pt states pain radiates to her lower back.     Allergies No Known Allergies  Level of Care/Admitting Diagnosis ED Disposition    ED Disposition Condition [facility] Area: [place] [[id number]]  Level of Care: Med-Surg [16]  Covid Evaluation: Asymptomatic Screening Protocol (No Symptoms)  Diagnosis: Pancreatitis [id number]  Admitting Physician: Harrie Foreman [id number]  Attending Physician: Harrie Foreman [id number]  Estimated length of stay: past midnight tomorrow  Certification:: I certify this patient will need inpatient services for at least 2 midnights  PT Class (Do Not Modify): Inpatient [101]  PT Acc Code (Do Not Modify): Private [1]       B Medical/Surgery History Past Medical History:  Diagnosis Date  . Alcohol abuse   . Bipolar 1 disorder ([place])   . Substance abuse [facility])    Past Surgical History:  Procedure Laterality Date  . NO PAST SURGERIES       A IV Location/Drains/Wounds Patient Lines/Drains/Airways Status   Active Line/Drains/Airways    Name:   Placement date:   Placement time:   Site:   Days:   Peripheral IV 07/06/19 Left;Upper Arm   07/06/19    2040    Arm   1   Peripheral IV 07/07/19 Right Wrist   07/07/19    0010    Wrist   less than 1          Intake/Output Last 24 hours  Intake/Output Summary (Last 24 hours) at  07/07/2019 0316 Last data filed at 07/07/2019 0211 Gross per 24 hour  Intake 1000 ml  Output -  Net 1000 ml    Labs/Imaging Results for orders placed or performed during the hospital encounter of 07/06/19 (from the past 48 hour(s))  CBC with Differential     Status: Abnormal   Collection Time: 07/06/19  5:41 PM  Result Value Ref Range   WBC 5.0 4.0 - 10.5 K/uL   RBC 3.98 3.87 - 5.11 MIL/uL   Hemoglobin 12.5 12.0 - 15.0 g/dL   HCT 35.2 (L) 36.0 - 46.0 %   MCV 88.4 80.0 - 100.0 fL   MCH 31.4 26.0 - 34.0 pg   MCHC 35.5 30.0 - 36.0 g/dL   RDW 16.8 (H) 11.5 - 15.5 %   Platelets 12 (LL) 150 - 400 K/uL    Comment: Immature Platelet Fraction may be clinically indicated, consider ordering this additional test [id number] THIS CRITICAL RESULT HAS VERIFIED AND BEEN CALLED TO KATIE FERGUSON BY TORITSEJU KPENOSEN ON 09 06 2020 AT [place], AND HAS BEEN READ BACK.     nRBC 0.6 (H) 0.0 - 0.2 %   Neutrophils Relative % 73 %   Neutro Abs 3.6 1.7 - 7.7 K/uL   Lymphocytes Relative 25 %   Lymphs Abs 1.3 0.7 - 4.0 K/uL   Monocytes Relative 1 %   Monocytes Absolute 0.1 0.1 -  1.0 K/uL   Eosinophils Relative 0 %   Eosinophils Absolute 0.0 0.0 - 0.5 K/uL   Basophils Relative 1 %   Basophils Absolute 0.0 0.0 - 0.1 K/uL   WBC Morphology MORPHOLOGY UNREMARKABLE    Smear Review PLATELETS APPEAR DECREASED     Comment: PLATELET COUNT CONFIRMED BY SMEAR   Immature Granulocytes 0 %   Abs Immature Granulocytes 0.01 0.00 - 0.07 K/uL   Stomatocytes PRESENT     Comment: Performed at [facility], [address], [place], [place] [address]  Comprehensive metabolic panel     Status: Abnormal   Collection Time: 07/06/19  6:17 PM  Result Value Ref Range   Sodium 136 135 - 145 mmol/L    Comment: ELECTROLYTES  CONFIRMED.MSS   Potassium 3.5 3.5 - 5.1 mmol/L   Chloride 94 (L) 98 - 111 mmol/L   CO2 22 22 - 32 mmol/L   Glucose, Bld 121 (H) 70 - 99 mg/dL   BUN 10 6 - 20 mg/dL   Creatinine, Ser 0.40 (L) 0.44 -  1.00 mg/dL   Calcium 9.0 8.9 - 10.3 mg/dL   Total Protein 8.3 (H) 6.5 - 8.1 g/dL   Albumin 3.5 3.5 - 5.0 g/dL   AST 77 (H) 15 - 41 U/L   ALT 21 0 - 44 U/L   Alkaline Phosphatase 73 38 - 126 U/L   Total Bilirubin 1.1 0.3 - 1.2 mg/dL   GFR calc non Af Amer >60 >60 mL/min   GFR calc Af Amer >60 >60 mL/min   Anion gap 20 (H) 5 - 15    Comment: Performed at [facility], [address] [place], [place] [address]  hCG, quantitative, pregnancy     Status: None   Collection Time: 07/06/19  6:17 PM  Result Value Ref Range   hCG, Beta Chain, Quant, S <1 <5 mIU/mL    Comment:          GEST. AGE      CONC.  (mIU/mL)   <=1 WEEK        5 - 50     2 WEEKS       50 - 500     3 WEEKS       100 - 10,000     4 WEEKS     1,000 - 30,000     5 WEEKS     3,500 - 115,000   6-8 WEEKS     12,000 - 270,000    12 WEEKS     15,000 - 220,000        FEMALE AND NON-PREGNANT FEMALE:     LESS THAN 5 mIU/mL Performed at [facility], [address] [place], [place] [address]   Lipase, blood     Status: Abnormal   Collection Time: 07/06/19  6:17 PM  Result Value Ref Range   Lipase 165 (H) 11 - 51 U/L    Comment: Performed at [facility], [place], [place] [address]  Troponin I (High Sensitivity)     Status: None   Collection Time: 07/06/19  6:17 PM  Result Value Ref Range   Troponin I (High Sensitivity) 9 <18 ng/L    Comment: (NOTE) Elevated high sensitivity troponin I (hsTnI) values and significant  changes across serial measurements may suggest ACS but many other  chronic and acute conditions are known to elevate hsTnI results.  Refer to the Links section for chest pain algorithms and additional  guidance. Performed at [facility], [address] [place], [place] [address]   Ethanol     Status: Abnormal   Collection Time: 07/06/19  6:17 PM  Result Value Ref Range   Alcohol, Ethyl (B) 43 (H) <10 mg/dL    Comment: (NOTE) Lowest detectable  limit for serum alcohol is 10 mg/dL. For medical purposes only. Performed at [facility], [address] [place], [place] [address]   CBC     Status: Abnormal   Collection Time: 07/07/19 12:01 AM  Result Value Ref Range   WBC 4.6 4.0 - 10.5 K/uL   RBC 3.97 3.87 - 5.11 MIL/uL   Hemoglobin 12.6 12.0 - 15.0 g/dL   HCT 35.1 (L) 36.0 - 46.0 %   MCV 88.4 80.0 - 100.0 fL   MCH 31.7 26.0 - 34.0 pg   MCHC 35.9 30.0 - 36.0 g/dL   RDW 16.7 (H) 11.5 - 15.5 %   Platelets 9 (LL) 150 - 400 K/uL    Comment: Immature Platelet Fraction may be clinically indicated, consider ordering this additional test [id number] THIS CRITICAL RESULT HAS VERIFIED AND BEEN CALLED TO TOM NAGY BY CHERYL FLORENCE ON 09 07 2020 AT 0037, AND HAS BEEN READ BACK.     nRBC 0.7 (H) 0.0 - 0.2 %    Comment: Performed at [facility], [address] [place], [place] [address]   Ct Abdomen Pelvis W Contrast  Result Date: 07/06/2019 CLINICAL DATA:  Acute, generalized abdominal pain and vomiting. Shortness of breath. EXAM: CT ABDOMEN AND PELVIS WITH CONTRAST TECHNIQUE: Multidetector CT imaging of the abdomen and pelvis was performed using the standard protocol following bolus administration of intravenous contrast. CONTRAST:  124mL OMNIPAQUE IOHEXOL 300 MG/ML  SOLN COMPARISON:  07/16/2014. FINDINGS: Lower chest: Unremarkable. Hepatobiliary: Marked diffuse low density of the liver relative to the spleen. The liver is enlarged, most pronounced involving the lateral segment left lobe and caudate lobe. Mild diffuse gallbladder wall enhancement without thickening or pericholecystic fluid. Pancreas: Diffuse peripancreatic soft tissue stranding and edema. This tracks superiorly and anteriorly. No discrete fluid collections are seen. Spleen: Normal in size without focal abnormality. Adrenals/Urinary Tract: Adrenal glands are unremarkable. Kidneys are normal, without renal calculi, focal lesion, or hydronephrosis. Bladder is  unremarkable. Stomach/Bowel: Mild low density distal gastric wall thickening. Normal appearing appendix, small bowel and colon. Vascular/Lymphatic: No significant vascular findings are present. No enlarged abdominal or pelvic lymph nodes. Reproductive: Enlarged, lobulated uterus containing coarse calcifications. Previously, with intravenous contrast, there were corresponding uterine masses. A simple appearing left ovarian cyst has not changed significantly, measuring 4.4 x 2.3 cm today on image number 62 series 2, previously 3.8 x 2.4 cm. Normal appearing right ovary containing a small follicular cyst. Other: Tiny umbilical hernia containing fat. Musculoskeletal: Unremarkable bones. IMPRESSION: 1. Acute pancreatitis without abscess. 2. Mild low density distal gastric wall thickening, most likely secondary to the acute pancreatitis. Concomitant acute gastritis is less likely. 3. Marked diffuse hepatic steatosis. 4. Hepatomegaly. 5. Stable simple appearing left ovarian cyst. Electronically Signed   By: Claudie Revering M.D.   On: 07/06/2019 21:26   Dg Chest Portable 1 View  Result Date: 07/06/2019 CLINICAL DATA:  PT to ER with c/o vomiting that started last night. Pt states had chest pressure that started prior to vomiting, and now she describes it as hurting. Pt actively vomiting in triage, diaphoretic. Pt states SHOB. EXAM: PORTABLE CHEST 1 VIEW COMPARISON:  Chest radiographs 11/22/2013, 02/16/2012 FINDINGS: Stable cardiomediastinal contours  within normal limits. The lungs are clear. No pneumothorax or large pleural effusion. No acute finding in the visualized skeleton. IMPRESSION: No evidence of active disease. Electronically Signed   By: Audie Pinto M.D.   On: 07/06/2019 17:53    Pending Labs Unresulted Labs (From admission, onward)    Start     Ordered   07/07/19 0108  Technologist smear review  ONCE - STAT,   STAT    Comments: Please evaluate for schistocytes    07/07/19 0107   07/06/19 2235   Urine Drug Screen, Qualitative ([place] only)  Once,   STAT     07/06/19 2234   07/06/19 2136  SARS CORONAVIRUS 2 (TAT 6-24 HRS) Nasopharyngeal Nasopharyngeal Swab  (Asymptomatic/Tier 2 Patients Labs)  Once,   STAT    Question Answer Comment  Is this test for diagnosis or screening Screening   Symptomatic for COVID-19 as defined by CDC No   Hospitalized for COVID-19 No   Admitted to ICU for COVID-19 No   Previously tested for COVID-19 No   Resident in a congregate (group) care setting No   Employed in healthcare setting No   Pregnant No      07/06/19 2135   07/06/19 1720  Pregnancy, urine  Once,   STAT     07/06/19 1719   07/06/19 1704  Urinalysis, Complete w Microscopic  ONCE - STAT,   STAT     07/06/19 1704   Signed and Held  TSH  Add-on,   R     Signed and Held   Signed and Held  Hemoglobin A1c  Add-on,   R     Signed and Held          Vitals/Pain Today's Vitals   07/06/19 1720 07/06/19 1800 07/06/19 1836 07/07/19 0137  BP:   (!) 181/122 (!) 167/121  Pulse:  (!) 114 (!) 102 (!) 108  Resp:  20 18 18   Temp:      TempSrc:      SpO2:  100% 100% 98%  Weight:      Height:      PainSc: 10-Worst pain ever   10-Worst pain ever    Isolation Precautions No active isolations  Medications Medications  0.9 %  sodium chloride infusion (has no administration in time range)  iohexol (OMNIPAQUE) 9 MG/ML oral solution 500 mL (500 mLs Oral Contrast Given 07/06/19 1745)  ondansetron (ZOFRAN) injection 4 mg (4 mg Intravenous Given 07/07/19 0245)  sodium chloride 0.9 % bolus 1,000 mL (0 mLs Intravenous Stopped 07/07/19 0211)  morphine 4 MG/ML injection 4 mg (4 mg Intravenous Given 07/06/19 1732)  ondansetron (ZOFRAN) injection 4 mg (4 mg Intravenous Given 07/06/19 1732)  ondansetron (ZOFRAN) injection 4 mg (4 mg Intravenous Given 07/06/19 1830)  ondansetron (ZOFRAN) injection 4 mg (4 mg Intravenous Given 07/06/19 1830)  HYDROmorphone (DILAUDID) injection 0.5 mg (0.5 mg Intravenous Given 07/06/19 1840)   iohexol (OMNIPAQUE) 300 MG/ML solution 100 mL (100 mLs Intravenous Contrast Given 07/06/19 2056)  HYDROmorphone (DILAUDID) injection 1 mg (1 mg Intramuscular Given 07/06/19 2024)  promethazine (PHENERGAN) injection 25 mg (25 mg Intramuscular Given 07/06/19 2025)  HYDROmorphone (DILAUDID) injection 0.5 mg (0.5 mg Intravenous Given 07/07/19 0012)  promethazine (PHENERGAN) injection 12.5 mg (12.5 mg Intravenous Given 07/07/19 0014)  HYDROmorphone (DILAUDID) injection 1 mg (1 mg Intravenous Given 07/07/19 0244)    Mobility walks Low fall risk   Focused Assessments Cardiac Assessment Handoff:  Cardiac Rhythm: Sinus tachycardia Lab Results  Component Value  Date   CKTOTAL 71 02/12/2013   TROPONINI < 0.02 07/16/2014   No results found for: DDIMER Does the Patient currently have chest pain? No     R Recommendations: See Admitting Provider Note  Report given to:   Additional Notes:

## 2019-07-07 NOTE — Consult Note (Addendum)
Name: Sarah Serrano MRN: [id number] DOB: 15-Nov-1975     CONSULTATION DATE: 07/06/2019  REFERRING MD :  gouru  CHIEF COMPLAINT:seizures    HISTORY OF PRESENT ILLNESS:   The patient with past medical history of alcohol abuse presents to the emergency department complaining of nausea and vomiting. -The patient reports that her symptoms began approximately 24 hours ago and have worsened.  She has had multiple episodes of nonbloody nonbilious emesis -Laboratory evaluation significant for mildly elevated ethanol level, elevated liver enzymes and severe thrombocytopenia.   Blood smear was found to be schistocytes negative.    Patient transferred to ICU for recurrent episodes of sezires according to nuring staff Now unresponsive Maintaining airway  HIV, COPVID test PENDING  PAST MEDICAL HISTORY :   has a past medical history of Alcohol abuse, Bipolar 1 disorder ([place]), and Substance abuse ([place]).  has a past surgical history that includes No past surgeries. Prior to Admission medications   Medication Sig Start Date End Date Taking? Authorizing Provider  naproxen (NAPROSYN) 500 MG tablet Take 1 tablet (500 mg total) by mouth 2 (two) times daily with a meal. 11/15/18   Triplett, Cari B, FNP   No Known Allergies  FAMILY HISTORY:  family history includes Bipolar disorder in her mother. SOCIAL HISTORY:  reports that she has been smoking cigarettes. She has been smoking about 0.50 packs per day. She has never used smokeless tobacco. She reports current alcohol use. She reports current drug use.  REVIEW OF SYSTEMS:   Unable to obtain due to critical illness   VITAL SIGNS: Temp:  [97.7 F (36.5 C)-97.8 F (36.6 C)] 97.8 F (36.6 C) (09/07 0453) Pulse Rate:  [83-115] 98 (09/07 0943) Resp:  [18-24] 18 (09/07 0137) BP: (161-213)/(102-145) 161/145 (09/07 0943) SpO2:  [98 %-100 %] 100 % (09/07 0926) Weight:  [68 kg] 68 kg (09/06 1701)   I/O last 3 completed shifts: In: 1063.5  [I.V.:63.5; IV Piggyback:1000] Out: -  No intake/output data recorded.   SpO2: 100 %   Physical Examination:  GENERAL:critically ill appearing,  HEAD: Normocephalic, atraumatic.  EYES: Pupils equal, round, reactive to light.  No scleral icterus.  MOUTH: Moist mucosal membrane. NECK: Supple. No JVD.  PULMONARY: +rhonchi,  CARDIOVASCULAR: S1 and S2. Regular rate and rhythm. No murmurs, rubs, or gallops.  GASTROINTESTINAL: Soft, nontender, -distended. No masses. Positive bowel sounds. No hepatosplenomegaly.  MUSCULOSKELETAL: No swelling, clubbing, or edema.  NEUROLOGIC: obtunded SKIN:intact,warm,dry    MEDICATIONS: I have reviewed all medications and confirmed regimen as documented      IMAGING    Ct Head Wo Contrast  Result Date: 07/07/2019 CLINICAL DATA:  Patient found down this morning after a fall. Initial encounter. EXAM: CT HEAD WITHOUT CONTRAST TECHNIQUE: Contiguous axial images were obtained from the base of the skull through the vertex without intravenous contrast. COMPARISON:  Head CT scan 07/16/2014. FINDINGS: Brain: No evidence of acute infarction, hemorrhage, hydrocephalus, extra-axial collection or mass lesion/mass effect. A few calcifications in the basal ganglia bilaterally are unchanged. Vascular: No hyperdense vessel or unexpected calcification. Skull: Intact.  No focal lesion. Sinuses/Orbits: Negative. Other: None. IMPRESSION: Negative head CT. Electronically Signed   By: Inge Rise M.D.   On: 07/07/2019 09:08   Ct Abdomen Pelvis W Contrast  Result Date: 07/06/2019 CLINICAL DATA:  Acute, generalized abdominal pain and vomiting. Shortness of breath. EXAM: CT ABDOMEN AND PELVIS WITH CONTRAST TECHNIQUE: Multidetector CT imaging of the abdomen and pelvis was performed using the standard protocol following bolus  administration of intravenous contrast. CONTRAST:  140mL OMNIPAQUE IOHEXOL 300 MG/ML  SOLN COMPARISON:  07/16/2014. FINDINGS: Lower chest: Unremarkable.  Hepatobiliary: Marked diffuse low density of the liver relative to the spleen. The liver is enlarged, most pronounced involving the lateral segment left lobe and caudate lobe. Mild diffuse gallbladder wall enhancement without thickening or pericholecystic fluid. Pancreas: Diffuse peripancreatic soft tissue stranding and edema. This tracks superiorly and anteriorly. No discrete fluid collections are seen. Spleen: Normal in size without focal abnormality. Adrenals/Urinary Tract: Adrenal glands are unremarkable. Kidneys are normal, without renal calculi, focal lesion, or hydronephrosis. Bladder is unremarkable. Stomach/Bowel: Mild low density distal gastric wall thickening. Normal appearing appendix, small bowel and colon. Vascular/Lymphatic: No significant vascular findings are present. No enlarged abdominal or pelvic lymph nodes. Reproductive: Enlarged, lobulated uterus containing coarse calcifications. Previously, with intravenous contrast, there were corresponding uterine masses. A simple appearing left ovarian cyst has not changed significantly, measuring 4.4 x 2.3 cm today on image number 62 series 2, previously 3.8 x 2.4 cm. Normal appearing right ovary containing a small follicular cyst. Other: Tiny umbilical hernia containing fat. Musculoskeletal: Unremarkable bones. IMPRESSION: 1. Acute pancreatitis without abscess. 2. Mild low density distal gastric wall thickening, most likely secondary to the acute pancreatitis. Concomitant acute gastritis is less likely. 3. Marked diffuse hepatic steatosis. 4. Hepatomegaly. 5. Stable simple appearing left ovarian cyst. Electronically Signed   By: Claudie Revering M.D.   On: 07/06/2019 21:26   Dg Chest Portable 1 View  Result Date: 07/06/2019 CLINICAL DATA:  PT to ER with c/o vomiting that started last night. Pt states had chest pressure that started prior to vomiting, and now she describes it as hurting. Pt actively vomiting in triage, diaphoretic. Pt states SHOB. EXAM:  PORTABLE CHEST 1 VIEW COMPARISON:  Chest radiographs 11/22/2013, 02/16/2012 FINDINGS: Stable cardiomediastinal contours within normal limits. The lungs are clear. No pneumothorax or large pleural effusion. No acute finding in the visualized skeleton. IMPRESSION: No evidence of active disease. Electronically Signed   By: Audie Pinto M.D.   On: 07/06/2019 17:53      ASSESSMENT AND PLAN SYNOPSIS  Severe and acute encephalopathy ?new onset seizures now Unresponsive +ETOH abuse with TCAD's on TOX screen Obtain EEG NEURO CONSULTATION CT head NEG Obtain MRI brain High risk for aspiration  Hgh risk for intubation  NEUROLOGY   SHOCK-SEPSIS/HYPOVOLUMIC/CARDIOGENIC -use vasopressors to keep MAP>65 -follow ABG and LA -follow up cultures -emperic ABX -consider stress dose steroids -aggressive IV fluid resuscitation  CARDIAC ICU monitoring  ID -continue IV abx as prescibed -follow up cultures  GI-pancreatitis from ETOH GI PROPHYLAXIS as indicated  NUTRITIONAL STATUS DIET-->NPO Constipation protocol as indicated   ENDO - will use ICU hypoglycemic\Hyperglycemia protocol if needed   ELECTROLYTES -follow labs as needed -replace as needed -pharmacy consultation and following   DVT/GI PRX ordered TRANSFUSIONS AS NEEDED MONITOR FSBS ASSESS the need for LABS    Critical Care Time devoted to patient care services described in this note is 40 minutes.   Overall, patient is critically ill, prognosis is guarded.      Corrin Parker, M.D.  [facility]  Medical Director [place] Director [facility] Cardio-Pulmonary Department

## 2019-07-07 NOTE — Progress Notes (Signed)
Pt has refused to allow lab to draw lab work. Pt also refusing EEG and urine drug screen. Pt has a pure-wick in place, urine can be collected via canister once pt urinates. Dr. Mortimer Fries aware, verbal orders to place a psych consult were given.

## 2019-07-07 NOTE — Progress Notes (Signed)
[place] [facility] [place] NAME: Sarah Serrano    MR#:  [id number]  DATE OF BIRTH:  05-May-1976  SUBJECTIVE:  CHIEF COMPLAINT: Patient was seen and examined for rapid response after she sustained a fall and became unconscious.  Stat CT head done which is negative.  Patient does not have IV access and rapid response called 2 more times for seizures.  I have witnessed tonic-clonic seizures, lipsmacking tongue biting.  Patient will receive IV Ativan soon after she has IV access will transfer her to ICU discussed with the neurologist, hematologist and intensivist  REVIEW OF SYSTEMS:  Limited review of system obtained by the patient was conscious between the seizure episodes  cONSTITUTIONAL: Reports weakness.  EYES: No blurred or double vision.  RESPIRATORY: No cough, shortness of breath, or hemoptysis.  CARDIOVASCULAR: No chest pain, orthopnea, edema.  GASTROINTESTINAL: No nausea, vomiting, diarrhea or abdominal pain.  HEMATOLOGY: No anemia, easy bruising or bleeding SKIN: No rash or lesion.  No purpura NEUROLOGIC: No tingling, numbness, weakness.    DRUG ALLERGIES:  No Known Allergies  VITALS:  Blood pressure (!) 161/145, pulse 98, temperature 97.8 F (36.6 C), temperature source Oral, resp. rate 18, height 5\' 4"  (1.626 m), weight 68 kg, last menstrual period 07/05/2019, SpO2 100 %.  PHYSICAL EXAMINATION:  GENERAL:  43 y.o.-year-old patient lying in the bed with no acute distress.  EYES: Pupils equal, round, reactive to light and accommodation. No scleral icterus. Extraocular muscles intact.  HEENT: Head atraumatic, normocephalic. Oropharynx and nasopharynx clear.  NECK:  Supple, no jugular venous distention. No thyroid enlargement, no tenderness.  LUNGS: Normal breath sounds bilaterally, no wheezing, rales,rhonchi or crepitation. No use of accessory muscles of respiration.  CARDIOVASCULAR: S1, S2 normal. No murmurs, rubs, or gallops.   ABDOMEN: Soft, reports epigastric tenderness no rebound tenderness nondistended. Bowel sounds present.  EXTREMITIES: No pedal edema, cyanosis, or clubbing.  NEUROLOGIC: Awake and alert in between the seizure episodes and answering few questions gait not checked for safety PSYCHIATRIC: The patient is alert and talking in between the seizure episodes.  SKIN: No obvious rash, lesion, or ulcer.    LABORATORY PANEL:   CBC Recent Labs  Lab 07/07/19 0001  WBC 4.6  HGB 12.6  HCT 35.1*  PLT 9*   ------------------------------------------------------------------------------------------------------------------  Chemistries  Recent Labs  Lab 07/06/19 1817  NA 136  K 3.5  CL 94*  CO2 22  GLUCOSE 121*  BUN 10  CREATININE 0.40*  CALCIUM 9.0  AST 77*  ALT 21  ALKPHOS 73  BILITOT 1.1   ------------------------------------------------------------------------------------------------------------------  Cardiac Enzymes No results for input(s): TROPONINI in the last 168 hours. ------------------------------------------------------------------------------------------------------------------  RADIOLOGY:  Ct Head Wo Contrast  Result Date: 07/07/2019 CLINICAL DATA:  Patient found down this morning after a fall. Initial encounter. EXAM: CT HEAD WITHOUT CONTRAST TECHNIQUE: Contiguous axial images were obtained from the base of the skull through the vertex without intravenous contrast. COMPARISON:  Head CT scan 07/16/2014. FINDINGS: Brain: No evidence of acute infarction, hemorrhage, hydrocephalus, extra-axial collection or mass lesion/mass effect. A few calcifications in the basal ganglia bilaterally are unchanged. Vascular: No hyperdense vessel or unexpected calcification. Skull: Intact.  No focal lesion. Sinuses/Orbits: Negative. Other: None. IMPRESSION: Negative head CT. Electronically Signed   By: Inge Rise M.D.   On: 07/07/2019 09:08   Ct Abdomen Pelvis W Contrast  Result Date:  07/06/2019 CLINICAL DATA:  Acute, generalized abdominal pain and vomiting. Shortness of breath. EXAM: CT ABDOMEN  AND PELVIS WITH CONTRAST TECHNIQUE: Multidetector CT imaging of the abdomen and pelvis was performed using the standard protocol following bolus administration of intravenous contrast. CONTRAST:  163mL OMNIPAQUE IOHEXOL 300 MG/ML  SOLN COMPARISON:  07/16/2014. FINDINGS: Lower chest: Unremarkable. Hepatobiliary: Marked diffuse low density of the liver relative to the spleen. The liver is enlarged, most pronounced involving the lateral segment left lobe and caudate lobe. Mild diffuse gallbladder wall enhancement without thickening or pericholecystic fluid. Pancreas: Diffuse peripancreatic soft tissue stranding and edema. This tracks superiorly and anteriorly. No discrete fluid collections are seen. Spleen: Normal in size without focal abnormality. Adrenals/Urinary Tract: Adrenal glands are unremarkable. Kidneys are normal, without renal calculi, focal lesion, or hydronephrosis. Bladder is unremarkable. Stomach/Bowel: Mild low density distal gastric wall thickening. Normal appearing appendix, small bowel and colon. Vascular/Lymphatic: No significant vascular findings are present. No enlarged abdominal or pelvic lymph nodes. Reproductive: Enlarged, lobulated uterus containing coarse calcifications. Previously, with intravenous contrast, there were corresponding uterine masses. A simple appearing left ovarian cyst has not changed significantly, measuring 4.4 x 2.3 cm today on image number 62 series 2, previously 3.8 x 2.4 cm. Normal appearing right ovary containing a small follicular cyst. Other: Tiny umbilical hernia containing fat. Musculoskeletal: Unremarkable bones. IMPRESSION: 1. Acute pancreatitis without abscess. 2. Mild low density distal gastric wall thickening, most likely secondary to the acute pancreatitis. Concomitant acute gastritis is less likely. 3. Marked diffuse hepatic steatosis. 4.  Hepatomegaly. 5. Stable simple appearing left ovarian cyst. Electronically Signed   By: Claudie Revering M.D.   On: 07/06/2019 21:26   Dg Chest Portable 1 View  Result Date: 07/06/2019 CLINICAL DATA:  PT to ER with c/o vomiting that started last night. Pt states had chest pressure that started prior to vomiting, and now she describes it as hurting. Pt actively vomiting in triage, diaphoretic. Pt states SHOB. EXAM: PORTABLE CHEST 1 VIEW COMPARISON:  Chest radiographs 11/22/2013, 02/16/2012 FINDINGS: Stable cardiomediastinal contours within normal limits. The lungs are clear. No pneumothorax or large pleural effusion. No acute finding in the visualized skeleton. IMPRESSION: No evidence of active disease. Electronically Signed   By: Audie Pinto M.D.   On: 07/06/2019 17:53    EKG:   Orders placed or performed during the hospital encounter of 07/06/19  . EKG 12-Lead  . EKG 12-Lead  . ED EKG  . ED EKG    ASSESSMENT AND PLAN:    #Recurrent breakthrough seizures, from alcohol withdrawal IV team successfully starting IV line IV Ativan stat 1 g IV Keppra Transfer to /ICU Stat CT head is negative Urgent consult placed and discussed with neurology Dr. Doy Mince she is aware of the consult N.p.o. EEG  #Malignant hypertension Nitropaste to the anterior chest wall IV labetalol as needed CT head is negative  #Severe thrombocytopenia with platelet count 9000 No schistocytes We will transfuse 1 unit of platelets currently patient is not bleeding we will monitor closely for symptoms and signs of bruises or bleeding Appreciate oncology recommendations Checking LDH, haptoglobin and reticulocyte count  #Acute pancreatitis alcohol induced N.p.o., IV fluids Pain medications as needed   supportive treatment  Transfer patient to ICU discussed with intensivist Dr. Mortimer Fries , he is agreeable    All the records are reviewed and case discussed with Care Management/Social Workerr. Management plans  discussed with the patient, family and they are in agreement.  CODE STATUS: fc   TOTAL CRITICAL CARE TIME TAKING CARE OF THIS PATIENT: 45 minutes.   POSSIBLE D/C IN  ?  DAYS, DEPENDING ON CLINICAL CONDITION.  Note: This dictation was prepared with Dragon dictation along with smaller phrase technology. Any transcriptional errors that result from this process are unintentional.   Nicholes Mango M.D on 07/07/2019 at 9:49 AM  Between 7am to 6pm - Pager - [phone] After 6pm go to [email or web address] - password [email or web address] [facility]  Office  [phone]  CC: Primary care physician; Patient, No Pcp Per

## 2019-07-07 NOTE — Progress Notes (Signed)
Rt responded to rapid response call to patients room. Saturations were 100% on [employer]. Charlie ICU charge RN and this RT felt ABG was needed amd sample was drawn and ran (see results under lab).

## 2019-07-07 NOTE — Progress Notes (Signed)
Patient now alert and awake Follows commands Refusing blood draws and EEG Asking for pain meds  Recommend Psych Consultation Avoid sedatives if possible Follow up Neuro Recs    Corrin Parker, M.D.  [facility]  Medical Director [place] Director [facility] Cardio-Pulmonary Department

## 2019-07-08 DIAGNOSIS — F319 Bipolar disorder, unspecified: Secondary | ICD-10-CM | POA: Diagnosis present

## 2019-07-08 DIAGNOSIS — F3130 Bipolar disorder, current episode depressed, mild or moderate severity, unspecified: Secondary | ICD-10-CM

## 2019-07-08 DIAGNOSIS — D649 Anemia, unspecified: Secondary | ICD-10-CM

## 2019-07-08 DIAGNOSIS — K859 Acute pancreatitis without necrosis or infection, unspecified: Secondary | ICD-10-CM

## 2019-07-08 LAB — CBC WITH DIFFERENTIAL/PLATELET
Abs Immature Granulocytes: 0.04 10*3/uL (ref 0.00–0.07)
Basophils Absolute: 0 10*3/uL (ref 0.0–0.1)
Basophils Relative: 0 %
Eosinophils Absolute: 0 10*3/uL (ref 0.0–0.5)
Eosinophils Relative: 0 %
HCT: 24.8 % — ABNORMAL LOW (ref 36.0–46.0)
Hemoglobin: 8.5 g/dL — ABNORMAL LOW (ref 12.0–15.0)
Immature Granulocytes: 1 %
Lymphocytes Relative: 46 %
Lymphs Abs: 2.2 10*3/uL (ref 0.7–4.0)
MCH: 31 pg (ref 26.0–34.0)
MCHC: 34.3 g/dL (ref 30.0–36.0)
MCV: 90.5 fL (ref 80.0–100.0)
Monocytes Absolute: 0.1 10*3/uL (ref 0.1–1.0)
Monocytes Relative: 2 %
Neutro Abs: 2.4 10*3/uL (ref 1.7–7.7)
Neutrophils Relative %: 51 %
Platelets: 6 10*3/uL — CL (ref 150–400)
RBC: 2.74 MIL/uL — ABNORMAL LOW (ref 3.87–5.11)
RDW: 16.5 % — ABNORMAL HIGH (ref 11.5–15.5)
WBC: 5.3 10*3/uL (ref 4.0–10.5)
nRBC: 2.1 % — ABNORMAL HIGH (ref 0.0–0.2)

## 2019-07-08 LAB — PREPARE PLATELET PHERESIS: Unit division: 0

## 2019-07-08 LAB — BASIC METABOLIC PANEL
Anion gap: 13 (ref 5–15)
BUN: 9 mg/dL (ref 6–20)
CO2: 27 mmol/L (ref 22–32)
Calcium: 8.2 mg/dL — ABNORMAL LOW (ref 8.9–10.3)
Chloride: 95 mmol/L — ABNORMAL LOW (ref 98–111)
Creatinine, Ser: 0.8 mg/dL (ref 0.44–1.00)
GFR calc Af Amer: >60 mL/min (ref >60)
GFR calc non Af Amer: >60 mL/min (ref >60)
Glucose, Bld: 108 mg/dL — ABNORMAL HIGH (ref 70–99)
Potassium: 2.6 mmol/L — CL (ref 3.5–5.1)
Sodium: 135 mmol/L (ref 135–145)

## 2019-07-08 LAB — LACTATE DEHYDROGENASE: LDH: 401 U/L — ABNORMAL HIGH (ref 98–192)

## 2019-07-08 LAB — BPAM PLATELET PHERESIS
Blood Product Expiration Date: 202009072359
ISSUE DATE / TIME: 202009071220
Unit Type and Rh: 6200

## 2019-07-08 LAB — POTASSIUM: Potassium: 2.9 mmol/L — ABNORMAL LOW (ref 3.5–5.1)

## 2019-07-08 LAB — PATHOLOGIST SMEAR REVIEW

## 2019-07-08 LAB — LIPASE, BLOOD: Lipase: 140 U/L — ABNORMAL HIGH (ref 11–51)

## 2019-07-08 LAB — PLATELET COUNT: Platelets: 7 10*3/uL — CL (ref 150–400)

## 2019-07-08 LAB — FOLATE: Folate: 5.5 ng/mL — ABNORMAL LOW (ref >5.9)

## 2019-07-08 LAB — APTT: aPTT: 29 seconds (ref 24–36)

## 2019-07-08 LAB — PROTIME-INR
INR: 1 (ref 0.8–1.2)
Prothrombin Time: 12.7 seconds (ref 11.4–15.2)

## 2019-07-08 LAB — MAGNESIUM: Magnesium: 1.5 mg/dL — ABNORMAL LOW (ref 1.7–2.4)

## 2019-07-08 LAB — AMMONIA: Ammonia: 33 umol/L (ref 9–35)

## 2019-07-08 LAB — VITAMIN B12: Vitamin B-12: 189 pg/mL (ref 180–914)

## 2019-07-08 LAB — FIBRINOGEN: Fibrinogen: 245 mg/dL (ref 210–475)

## 2019-07-08 MED ORDER — HYDROCOD POLST-CPM POLST ER 10-8 MG/5ML PO SUER: 5.0000 mL | Freq: Every evening | ORAL | Status: DC | PRN

## 2019-07-08 MED ORDER — POTASSIUM CHLORIDE 10 MEQ/100ML IV SOLN
10.0000 meq | INTRAVENOUS | Status: AC
  Administered 2019-07-08 (×6): 10 meq via INTRAVENOUS
  Filled 2019-07-08: qty 100

## 2019-07-08 MED ORDER — FOLIC ACID 1 MG PO TABS
1.0000 mg | ORAL_TABLET | Freq: Every day | ORAL | Status: DC
  Administered 2019-07-08 – 2019-07-14 (×7): 1 mg via ORAL
  Filled 2019-07-08 (×7): qty 1

## 2019-07-08 MED ORDER — GABAPENTIN 300 MG PO CAPS
300.0000 mg | ORAL_CAPSULE | Freq: Two times a day (BID) | ORAL | Status: DC | PRN
  Administered 2019-07-08 – 2019-07-09 (×4): 300 mg via ORAL
  Filled 2019-07-08 (×4): qty 1

## 2019-07-08 MED ORDER — GUAIFENESIN-DM 100-10 MG/5ML PO SYRP: 5.0000 mL | ORAL_SOLUTION | ORAL | Status: DC | PRN

## 2019-07-08 MED ORDER — SODIUM CHLORIDE 0.9% IV SOLUTION
Freq: Once | INTRAVENOUS | Status: AC
  Administered 2019-07-08: 17:00:00 via INTRAVENOUS

## 2019-07-08 MED ORDER — KETOROLAC TROMETHAMINE 15 MG/ML IJ SOLN
15.0000 mg | Freq: Three times a day (TID) | INTRAMUSCULAR | Status: DC | PRN
  Administered 2019-07-08: 15 mg via INTRAVENOUS
  Filled 2019-07-08 (×2): qty 1

## 2019-07-08 MED ORDER — SERTRALINE HCL 50 MG PO TABS
150.0000 mg | ORAL_TABLET | Freq: Every day | ORAL | Status: DC
  Administered 2019-07-08 – 2019-07-09 (×2): 150 mg via ORAL
  Filled 2019-07-08 (×2): qty 3

## 2019-07-08 MED ORDER — LORAZEPAM 2 MG/ML IJ SOLN
0.0000 mg | INTRAMUSCULAR | Status: AC
  Administered 2019-07-08: 12:00:00 2 mg via INTRAVENOUS
  Administered 2019-07-08: 21:00:00 4 mg via INTRAVENOUS
  Administered 2019-07-08 – 2019-07-09 (×5): 2 mg via INTRAVENOUS
  Filled 2019-07-08: qty 2
  Filled 2019-07-08 (×8): qty 1

## 2019-07-08 MED ORDER — VITAMIN B-1 100 MG PO TABS
100.0000 mg | ORAL_TABLET | Freq: Every day | ORAL | Status: DC
  Administered 2019-07-09 – 2019-07-14 (×6): 100 mg via ORAL
  Filled 2019-07-08 (×6): qty 1

## 2019-07-08 MED ORDER — CYANOCOBALAMIN 1000 MCG/ML IJ SOLN
1000.0000 ug | Freq: Every day | INTRAMUSCULAR | Status: AC
  Administered 2019-07-08 – 2019-07-12 (×5): 1000 ug via INTRAMUSCULAR
  Filled 2019-07-08 (×5): qty 1

## 2019-07-08 MED ORDER — KETOROLAC TROMETHAMINE 30 MG/ML IJ SOLN
30.0000 mg | Freq: Once | INTRAMUSCULAR | Status: AC
  Administered 2019-07-08: 30 mg via INTRAVENOUS
  Filled 2019-07-08: qty 1

## 2019-07-08 MED ORDER — THIAMINE HCL 100 MG/ML IJ SOLN
100.0000 mg | Freq: Every day | INTRAMUSCULAR | Status: DC
  Administered 2019-07-08: 09:00:00 100 mg via INTRAVENOUS
  Filled 2019-07-08 (×2): qty 2

## 2019-07-08 MED ORDER — LORAZEPAM 2 MG/ML IJ SOLN
0.0000 mg | Freq: Two times a day (BID) | INTRAMUSCULAR | Status: AC
  Administered 2019-07-08 – 2019-07-11 (×4): 2 mg via INTRAVENOUS
  Filled 2019-07-08: qty 2
  Filled 2019-07-08: qty 1

## 2019-07-08 MED ORDER — HYDROXYZINE HCL 25 MG PO TABS
25.0000 mg | ORAL_TABLET | Freq: Three times a day (TID) | ORAL | Status: DC | PRN
  Filled 2019-07-08: qty 1

## 2019-07-08 MED ORDER — LORAZEPAM 2 MG PO TABS
2.0000 mg | ORAL_TABLET | Freq: Four times a day (QID) | ORAL | Status: AC | PRN
  Administered 2019-07-09 – 2019-07-10 (×3): 2 mg via ORAL
  Filled 2019-07-08 (×3): qty 1

## 2019-07-08 MED ORDER — SODIUM CHLORIDE 0.9 % IV BOLUS
500.0000 mL | Freq: Once | INTRAVENOUS | Status: AC
  Administered 2019-07-08: 500 mL via INTRAVENOUS

## 2019-07-08 MED ORDER — MAGNESIUM SULFATE 2 GM/50ML IV SOLN
2.0000 g | Freq: Once | INTRAVENOUS | Status: AC
  Administered 2019-07-08: 12:00:00 2 g via INTRAVENOUS
  Filled 2019-07-08: qty 50

## 2019-07-08 MED ORDER — QUETIAPINE FUMARATE 300 MG PO TABS
300.0000 mg | ORAL_TABLET | Freq: Two times a day (BID) | ORAL | Status: DC
  Administered 2019-07-08 – 2019-07-13 (×11): 300 mg via ORAL
  Filled 2019-07-08 (×12): qty 1

## 2019-07-08 MED ORDER — ADULT MULTIVITAMIN W/MINERALS CH
1.0000 | ORAL_TABLET | Freq: Every day | ORAL | Status: DC
  Administered 2019-07-08 – 2019-07-14 (×7): 1 via ORAL
  Filled 2019-07-08 (×7): qty 1

## 2019-07-08 MED ORDER — CLONIDINE HCL 0.1 MG PO TABS
0.1000 mg | ORAL_TABLET | Freq: Two times a day (BID) | ORAL | Status: DC
  Administered 2019-07-08 – 2019-07-09 (×2): 0.1 mg via ORAL
  Filled 2019-07-08 (×2): qty 1

## 2019-07-08 MED ORDER — METOPROLOL TARTRATE 5 MG/5ML IV SOLN
5.0000 mg | INTRAVENOUS | Status: DC | PRN
  Administered 2019-07-08 – 2019-07-09 (×2): 5 mg via INTRAVENOUS
  Filled 2019-07-08 (×2): qty 5

## 2019-07-08 MED ORDER — CYCLOBENZAPRINE HCL 10 MG PO TABS
10.0000 mg | ORAL_TABLET | Freq: Two times a day (BID) | ORAL | Status: DC | PRN
  Administered 2019-07-08 – 2019-07-09 (×2): 10 mg via ORAL
  Filled 2019-07-08 (×2): qty 1

## 2019-07-08 MED ORDER — SODIUM CHLORIDE 0.9 % IV SOLN
INTRAVENOUS | Status: DC
  Administered 2019-07-08: 04:00:00 via INTRAVENOUS

## 2019-07-08 MED ORDER — POTASSIUM CHLORIDE CRYS ER 20 MEQ PO TBCR
40.0000 meq | EXTENDED_RELEASE_TABLET | Freq: Once | ORAL | Status: AC
  Administered 2019-07-08: 21:00:00 40 meq via ORAL
  Filled 2019-07-08: qty 2

## 2019-07-08 MED ORDER — LORAZEPAM 2 MG/ML IJ SOLN
2.0000 mg | Freq: Four times a day (QID) | INTRAMUSCULAR | Status: AC | PRN
  Administered 2019-07-08 – 2019-07-10 (×3): 2 mg via INTRAVENOUS
  Filled 2019-07-08 (×2): qty 1

## 2019-07-08 MED ORDER — POTASSIUM CHLORIDE IN NACL 40-0.9 MEQ/L-% IV SOLN
INTRAVENOUS | Status: DC
  Administered 2019-07-08 – 2019-07-10 (×4): 125 mL/h via INTRAVENOUS
  Filled 2019-07-08 (×6): qty 1000

## 2019-07-08 NOTE — Progress Notes (Addendum)
Spoke with hospitalist Dr Posey Pronto as pt wants to leave AMA.  Spent considerable time with pt trying to convience her to stay based on her multiple medical problems.  Pt has an order to remain NPO secondary to acute pancreatitis.   Pt has been having pain medication around the clock and narcan was used yesterday because of oversedation.  Pt asking for pain medication now.  Requested physician come speak to the patient but she will can not now. Nursing Supervisor came to see pt and negotiated that the patient can have apple juice.  Pt's family member is down in the lobby wanting to come take the patient home.  Did not give permission for the family member to come to floor as I think this will encourage pt to leave. Because of her multiple medical problems it is in the patient's best interest to stay. Dorna Bloom RN

## 2019-07-08 NOTE — Progress Notes (Signed)
Patient refused eeg

## 2019-07-08 NOTE — Consult Note (Signed)
[place] Psychiatry Consult   Reason for Consult:  Bipolar disorder Referring Physician:  EDP Patient Identification: Sarah Serrano MRN:  [id number] Principal Diagnosis: Pancreatitis Diagnosis:  Principal Problem:   Pancreatitis Active Problems:   Thrombocytopenia ([place])   Anemia   Bipolar disorder ([place])   Total Time spent with patient: 30 minutes  Subjective:   Sarah Serrano is a 43 y.o. female patient reports that she is doing well today.  Patient reports also that she has bipolar disorder and she is supposed to be taking Seroquel and Zoloft every day.  She states that she has been compliant with her medications and she has been doing well.  She states she came to the hospital because she has been going back pain and she is not sure why she is having seizures now.  She reported that she does not remember refusing to do the EEG earlier today and states that she does want to have the EEG done since she is now understanding that it is too helpful to figure out why she is having seizures.  She reports that she was not explained that while she was having an EEG or whether an EEG was.  Patient denies any suicidal or homicidal ideations and denies any hallucinations.  HPI:  Per Dr. Cinda Quest: 43 y.o. female who reports epigastric pain starting last night with a lot of vomiting.  This pain radiates to her back.  She gets very sweaty when she vomits.  Nurse reported she was soaking wet when she began vomiting in the emergency room.  Pain comes up into the chest somewhat.  Makes her short of breath.  It hurts a little bit when she breathes as well.  Pain is moderately severe deep and achy.  She is not complaining of fever.  No blood in the vomit.  No diarrhea.  The pain makes her short of breath.  Patient is seen by this provider via face-to-face.  Patient is alert and oriented x3 during the evaluation.  Patient denies any suicidal or homicidal ideations and denies any hallucinations.   Patient had stated that she has been compliant with her medications.  Patient is pleasant, calm, and cooperative.  He has continued to complain about back pain radiating down her left side and I was in the room with her.  After evaluating patient is reporting that she has bipolar disorder and is supposed to be taking Seroquel as well as Zoloft and she wanted to be restarted on her medications so that things do not get worse for her, I have restarted the Seroquel 300 mg p.o. twice daily as well as her Zoloft 150 mg p.o. daily.  Patient seemed to have some misunderstanding about the EEG and agreed to have the EEG done and because she stated that she wanted to understand what was going on with her seizures.  Unsure if this is true that the patient misunderstood however the patient stated that she did not remember telling anyone that she did not want to do the EEG and is now agreeing to it.  At this time patient does not meet inpatient criteria and is psychiatrically cleared.  Past Psychiatric History: Bipolar disorder  Risk to Self:   Risk to Others:   Prior Inpatient Therapy:   Prior Outpatient Therapy:    Past Medical History:  Past Medical History:  Diagnosis Date  . Alcohol abuse   . Bipolar 1 disorder ([place])   . Substance abuse ([place])     Past  Surgical History:  Procedure Laterality Date  . NO PAST SURGERIES     Family History:  Family History  Problem Relation Age of Onset  . Bipolar disorder Mother    Family Psychiatric  History: See above Social History:  Social History   Substance and Sexual Activity  Alcohol Use Yes     Social History   Substance and Sexual Activity  Drug Use Yes    Social History   Socioeconomic History  . Marital status: Single    Spouse name: Not on file  . Number of children: Not on file  . Years of education: Not on file  . Highest education level: Not on file  Occupational History  . Not on file  Social Needs  . Financial resource strain:  Not on file  . Food insecurity    Worry: Not on file    Inability: Not on file  . Transportation needs    Medical: Not on file    Non-medical: Not on file  Tobacco Use  . Smoking status: Current Every Day Smoker    Packs/day: 0.50    Types: Cigarettes  . Smokeless tobacco: Never Used  Substance and Sexual Activity  . Alcohol use: Yes  . Drug use: Yes  . Sexual activity: Not on file  Lifestyle  . Physical activity    Days per week: Not on file    Minutes per session: Not on file  . Stress: Not on file  Relationships  . Social Herbalist on phone: Not on file    Gets together: Not on file    Attends religious service: Not on file    Active member of club or organization: Not on file    Attends meetings of clubs or organizations: Not on file    Relationship status: Not on file  Other Topics Concern  . Not on file  Social History Narrative  . Not on file   Additional Social History:    Allergies:  No Known Allergies  Labs:  Results for orders placed or performed during the hospital encounter of 07/06/19 (from the past 48 hour(s))  CBC     Status: Abnormal   Collection Time: 07/07/19 12:01 AM  Result Value Ref Range   WBC 4.6 4.0 - 10.5 K/uL   RBC 3.97 3.87 - 5.11 MIL/uL   Hemoglobin 12.6 12.0 - 15.0 g/dL   HCT 35.1 (L) 36.0 - 46.0 %   MCV 88.4 80.0 - 100.0 fL   MCH 31.7 26.0 - 34.0 pg   MCHC 35.9 30.0 - 36.0 g/dL   RDW 16.7 (H) 11.5 - 15.5 %   Platelets 9 (LL) 150 - 400 K/uL    Comment: Immature Platelet Fraction may be clinically indicated, consider ordering this additional test [id number] THIS CRITICAL RESULT HAS VERIFIED AND BEEN CALLED TO TOM NAGY BY CHERYL FLORENCE ON 09 07 2020 AT 0037, AND HAS BEEN READ BACK.     nRBC 0.7 (H) 0.0 - 0.2 %    Comment: Performed at [facility], [address] [place], [place] [address]  Technologist smear review     Status: None   Collection Time: 07/07/19 12:01 AM  Result Value Ref Range   WBC  Morphology MORPHOLOGY UNREMARKABLE    RBC Morphology TARGET CELLS     Comment: NO SCHISTOCYTES SEEN   Tech Review PLATELETS APPEAR DECREASED     Comment: Performed at [facility], [address], [place], [place] [address]  SARS CORONAVIRUS 2 (TAT 6-24 HRS) Nasopharyngeal Nasopharyngeal Swab     Status: None   Collection Time: 07/07/19  1:34 AM   Specimen: Nasopharyngeal Swab  Result Value Ref Range   SARS Coronavirus 2 NEGATIVE NEGATIVE    Comment: (NOTE) SARS-CoV-2 target nucleic acids are NOT DETECTED. The SARS-CoV-2 RNA is generally detectable in upper and lower respiratory specimens during the acute phase of infection. Negative results do not preclude SARS-CoV-2 infection, do not rule out co-infections with other pathogens, and should not be used as the sole basis for treatment or other patient management decisions. Negative results must be combined with clinical observations, patient history, and epidemiological information. The expected result is Negative. Fact Sheet for Patients: SugarRoll.be Fact Sheet for Healthcare Providers: https://www.woods-mathews.com/ This test is not yet approved or cleared by the [place] FDA and  has been authorized for detection and/or diagnosis of SARS-CoV-2 by FDA under an Emergency Use Authorization (EUA). This EUA will remain  in effect (meaning this test can be used) for the duration of the COVID-19 declaration under Section 56 4(b)(1) of the Act, 21 U.S.C. section 360bbb-3(b)(1), unless the authorization is terminated or revoked sooner. Performed at [facility], [place] [address] [place], [place] [address]   Urinalysis, Complete w Microscopic     Status: Abnormal   Collection Time: 07/07/19  5:48 AM  Result Value Ref Range   Color, Urine AMBER (A) YELLOW    Comment: BIOCHEMICALS MAY BE AFFECTED BY COLOR   APPearance CLOUDY (A) CLEAR   Specific Gravity, Urine >1.046 (H)  1.005 - 1.030   pH 6.0 5.0 - 8.0   Glucose, UA NEGATIVE NEGATIVE mg/dL   Hgb urine dipstick LARGE (A) NEGATIVE   Bilirubin Urine SMALL (A) NEGATIVE   Ketones, ur NEGATIVE NEGATIVE mg/dL   Protein, ur 100 (A) NEGATIVE mg/dL   Nitrite NEGATIVE NEGATIVE   Leukocytes,Ua NEGATIVE NEGATIVE   RBC / HPF >50 (H) 0 - 5 RBC/hpf   WBC, UA 21-50 0 - 5 WBC/hpf   Bacteria, UA NONE SEEN NONE SEEN   Squamous Epithelial / LPF 0-5 0 - 5   Mucus PRESENT     Comment: Performed at [facility], [address] [place], [place] [address]  Pregnancy, urine     Status: None   Collection Time: 07/07/19  5:48 AM  Result Value Ref Range   Preg Test, Ur NEGATIVE NEGATIVE    Comment: Performed at [facility], [address] [place], [place] [address]  Urine Drug Screen, Qualitative ([facility] only)     Status: Abnormal   Collection Time: 07/07/19  5:48 AM  Result Value Ref Range   Tricyclic, Ur Screen POSITIVE (A) NONE DETECTED   Amphetamines, Ur Screen NONE DETECTED NONE DETECTED   MDMA (Ecstasy)Ur Screen NONE DETECTED NONE DETECTED   Cocaine Metabolite,Ur [place] NONE DETECTED NONE DETECTED   Opiate, Ur Screen POSITIVE (A) NONE DETECTED   Phencyclidine (PCP) Ur S NONE DETECTED NONE DETECTED   Cannabinoid 50 Ng, Ur [place] NONE DETECTED NONE DETECTED   Barbiturates, Ur Screen NONE DETECTED NONE DETECTED   Benzodiazepine, Ur Scrn NONE DETECTED NONE DETECTED   Methadone Scn, Ur NONE DETECTED NONE DETECTED    Comment: (NOTE) Tricyclics + metabolites, urine    Cutoff 1000 ng/mL Amphetamines + metabolites, urine  Cutoff 1000 ng/mL MDMA (Ecstasy), urine              Cutoff 500 ng/mL Cocaine Metabolite, urine  Cutoff 300 ng/mL Opiate + metabolites, urine        Cutoff 300 ng/mL Phencyclidine (PCP), urine         Cutoff 25 ng/mL Cannabinoid, urine                 Cutoff 50 ng/mL Barbiturates + metabolites, urine  Cutoff 200 ng/mL Benzodiazepine, urine              Cutoff 200 ng/mL Methadone,  urine                   Cutoff 300 ng/mL The urine drug screen provides only a preliminary, unconfirmed analytical test result and should not be used for non-medical purposes. Clinical consideration and professional judgment should be applied to any positive drug screen result due to possible interfering substances. A more specific alternate chemical method must be used in order to obtain a confirmed analytical result. Gas chromatography / mass spectrometry (GC/MS) is the preferred confirmat ory method. Performed at [facility], [address] [place], [place] [address]   Blood gas, arterial     Status: Abnormal (Preliminary result)   Collection Time: 07/07/19  8:03 AM  Result Value Ref Range   FIO2 0.21    pH, Arterial 7.27 (L) 7.350 - 7.450   pCO2 arterial 31 (L) 32.0 - 48.0 mmHg   pO2, Arterial 101 83.0 - 108.0 mmHg   Bicarbonate 14.2 (L) 20.0 - 28.0 mmol/L   Acid-base deficit 11.4 (H) 0.0 - 2.0 mmol/L   O2 Saturation 96.9 %   Patient temperature 37.0    Collection site PENDING    Drawn by RIGHT RADIAL     Comment: RIGHT RADIAL   Sample type ARTERIAL DRAW    Allens test (pass/fail) PASS PASS    Comment: Performed at [facility], [address] [place], [place] [address]  Glucose, capillary     Status: Abnormal   Collection Time: 07/07/19  8:14 AM  Result Value Ref Range   Glucose-Capillary 278 (H) 70 - 99 mg/dL  Troponin I (High Sensitivity)     Status: Abnormal   Collection Time: 07/07/19  8:55 AM  Result Value Ref Range   Troponin I (High Sensitivity) 20 (H) <18 ng/L    Comment: (NOTE) Elevated high sensitivity troponin I (hsTnI) values and significant  changes across serial measurements may suggest ACS but many other  chronic and acute conditions are known to elevate hsTnI results.  Refer to the "Links" section for chest pain algorithms and additional  guidance. Performed at [facility], [address] [place], [place] [address]    TSH     Status: None   Collection Time: 07/07/19  8:55 AM  Result Value Ref Range   TSH 3.020 0.350 - 4.500 uIU/mL    Comment: Performed by a 3rd Generation assay with a functional sensitivity of <=0.01 uIU/mL. Performed at [facility], [place], [place] [address]   Lactate dehydrogenase     Status: Abnormal   Collection Time: 07/07/19  8:55 AM  Result Value Ref Range   LDH 456 (H) 98 - 192 U/L    Comment: Performed at [facility], [address] [place], [place] [address]  Protime-INR     Status: None   Collection Time: 07/07/19  8:55 AM  Result Value Ref Range   Prothrombin Time 12.7 11.4 - 15.2 seconds   INR 1.0 0.8 - 1.2    Comment: (NOTE) INR goal  varies based on device and disease states. Performed at [facility], [address] [place], [place] [address]   Platelet count     Status: Abnormal   Collection Time: 07/07/19  8:55 AM  Result Value Ref Range   Platelets 5 (LL) 150 - 400 K/uL    Comment: Immature Platelet Fraction may be clinically indicated, consider ordering this additional test [id number] CRITICAL VALUE NOTED.  VALUE IS CONSISTENT WITH PREVIOUSLY REPORTED AND CALLED VALUE. Performed at [facility], [address] [place], [place] [address]   Fibrinogen     Status: Abnormal   Collection Time: 07/07/19  8:55 AM  Result Value Ref Range   Fibrinogen 163 (L) 210 - 475 mg/dL    Comment: Performed at [facility], [address] [place], [place] [address]  Reticulocytes     Status: Abnormal   Collection Time: 07/07/19  8:55 AM  Result Value Ref Range   Retic Ct Pct 0.5 0.4 - 3.1 %   RBC. 3.81 (L) 3.87 - 5.11 MIL/uL   Retic Count, Absolute 19.4 19.0 - 186.0 K/uL   Immature Retic Fract 5.5 2.3 - 15.9 %    Comment: Performed at [facility], [address], [place], [place] [address]  ABO/Rh     Status: None   Collection Time: 07/07/19  8:55 AM  Result Value Ref Range    ABO/RH(D)      O POS Performed at [facility], [place] [place], [place] [address]   Prepare Pheresed Platelets     Status: None   Collection Time: 07/07/19  9:43 AM  Result Value Ref Range   Unit Number [id number]    Blood Component Type PLTPH LI2 PAS    Unit division 00    Status of Unit ISSUED,FINAL    Transfusion Status      OK TO TRANSFUSE Performed at [facility], [address] [place], [place] [address]   Glucose, capillary     Status: Abnormal   Collection Time: 07/07/19 10:13 AM  Result Value Ref Range   Glucose-Capillary 170 (H) 70 - 99 mg/dL  SARS Coronavirus 2 [facility] order, Performed in [place] hospital lab) Nasopharyngeal     Status: None   Collection Time: 07/07/19 10:16 AM   Specimen: Nasopharyngeal  Result Value Ref Range   SARS Coronavirus 2 NEGATIVE NEGATIVE    Comment: (NOTE) If result is NEGATIVE SARS-CoV-2 target nucleic acids are NOT DETECTED. The SARS-CoV-2 RNA is generally detectable in upper and lower  respiratory specimens during the acute phase of infection. The lowest  concentration of SARS-CoV-2 viral copies this assay can detect is 250  copies / mL. A negative result does not preclude SARS-CoV-2 infection  and should not be used as the sole basis for treatment or other  patient management decisions.  A negative result may occur with  improper specimen collection / handling, submission of specimen other  than nasopharyngeal swab, presence of viral mutation(s) within the  areas targeted by this assay, and inadequate number of viral copies  (<250 copies / mL). A negative result must be combined with clinical  observations, patient history, and epidemiological information. If result is POSITIVE SARS-CoV-2 target nucleic acids are DETECTED. The SARS-CoV-2 RNA is generally detectable in upper and lower  respiratory specimens dur ing the acute phase of infection.  Positive  results are indicative of active  infection with SARS-CoV-2.  Clinical  correlation with patient history and other diagnostic information  is  necessary to determine patient infection status.  Positive results do  not rule out bacterial infection or co-infection with other viruses. If result is PRESUMPTIVE POSTIVE SARS-CoV-2 nucleic acids MAY BE PRESENT.   A presumptive positive result was obtained on the submitted specimen  and confirmed on repeat testing.  While 2019 novel coronavirus  (SARS-CoV-2) nucleic acids may be present in the submitted sample  additional confirmatory testing may be necessary for epidemiological  and / or clinical management purposes  to differentiate between  SARS-CoV-2 and other Sarbecovirus currently known to infect humans.  If clinically indicated additional testing with an alternate test  methodology [phone]) is advised. The SARS-CoV-2 RNA is generally  detectable in upper and lower respiratory sp ecimens during the acute  phase of infection. The expected result is Negative. Fact Sheet for Patients:  StrictlyIdeas.no Fact Sheet for Healthcare Providers: BankingDealers.co.za This test is not yet approved or cleared by the [place] FDA and has been authorized for detection and/or diagnosis of SARS-CoV-2 by FDA under an Emergency Use Authorization (EUA).  This EUA will remain in effect (meaning this test can be used) for the duration of the COVID-19 declaration under Section 564(b)(1) of the Act, 21 U.S.C. section 360bbb-3(b)(1), unless the authorization is terminated or revoked sooner. Performed at [facility], [address] [place], [place] [address]   MRSA PCR Screening     Status: None   Collection Time: 07/07/19 10:16 AM   Specimen: Nasopharyngeal  Result Value Ref Range   MRSA by PCR NEGATIVE NEGATIVE    Comment:        The GeneXpert MRSA Assay (FDA approved for NASAL specimens only), is one component of  a comprehensive MRSA colonization surveillance program. It is not intended to diagnose MRSA infection nor to guide or monitor treatment for MRSA infections. Performed at [facility], [address] [place], [place] [address]   Type and screen     Status: None   Collection Time: 07/07/19 10:44 AM  Result Value Ref Range   ABO/RH(D) O POS    Antibody Screen NEG    Sample Expiration      07/10/2019,2359 Performed at [facility], [address] [place], [place] [address]   Magnesium     Status: Abnormal   Collection Time: 07/07/19 10:44 AM  Result Value Ref Range   Magnesium 1.6 (L) 1.7 - 2.4 mg/dL    Comment: Performed at [facility], [address] [place], [place] [address]  Phosphorus     Status: None   Collection Time: 07/07/19 10:44 AM  Result Value Ref Range   Phosphorus 3.9 2.5 - 4.6 mg/dL    Comment: Performed at [facility], [address], [place], [place] [address]  Pathologist smear review     Status: None   Collection Time: 07/07/19  5:20 PM  Result Value Ref Range   Path Review      Peripheral blood smear is reviewed at clinician request.    Comment: Normocytic anemia with occasional target cells and nucleated RBCs. Schistocytes are not identified. Unremarkable WBC morphology. Marked thrombocytopenia with unremarkable morphology. These findings were communicated to Dr. Janese Banks. Reviewed by Dellia Nims Reuel Derby, M.D. Performed at [facility], [address] [place], [place] [address]   CBC with Differential/Platelet     Status: Abnormal   Collection Time: 07/07/19  5:20 PM  Result Value Ref Range   WBC 4.6 4.0 - 10.5 K/uL   RBC 3.23 (  L) 3.87 - 5.11 MIL/uL   Hemoglobin 10.3 (L) 12.0 - 15.0 g/dL   HCT 28.4 (L) 36.0 - 46.0 %   MCV 87.9 80.0 - 100.0 fL   MCH 31.9 26.0 - 34.0 pg   MCHC 36.3 (H) 30.0 - 36.0 g/dL   RDW 16.3 (H) 11.5 - 15.5 %   Platelets 4 (LL) 150 - 400 K/uL    Comment: Immature Platelet Fraction  may be clinically indicated, consider ordering this additional test [id number] CRITICAL VALUE NOTED.  VALUE IS CONSISTENT WITH PREVIOUSLY REPORTED AND CALLED VALUE.    nRBC 2.2 (H) 0.0 - 0.2 %   Neutrophils Relative % 73 %   Neutro Abs 3.3 1.7 - 7.7 K/uL   Lymphocytes Relative 25 %   Lymphs Abs 1.1 0.7 - 4.0 K/uL   Monocytes Relative 1 %   Monocytes Absolute 0.1 0.1 - 1.0 K/uL   Eosinophils Relative 0 %   Eosinophils Absolute 0.0 0.0 - 0.5 K/uL   Basophils Relative 0 %   Basophils Absolute 0.0 0.0 - 0.1 K/uL   WBC Morphology MORPHOLOGY UNREMARKABLE    Smear Review PLATELETS APPEAR DECREASED    Immature Granulocytes 1 %   Abs Immature Granulocytes 0.05 0.00 - 0.07 K/uL   Stomatocytes PRESENT     Comment: Performed at [facility], [address] [place], [place] [address]  Ammonia     Status: None   Collection Time: 07/08/19  6:52 AM  Result Value Ref Range   Ammonia 33 9 - 35 umol/L    Comment: Performed at [facility], [address] [place], [place] [address]  CBC with Differential/Platelet     Status: Abnormal   Collection Time: 07/08/19  6:52 AM  Result Value Ref Range   WBC 5.3 4.0 - 10.5 K/uL   RBC 2.74 (L) 3.87 - 5.11 MIL/uL   Hemoglobin 8.5 (L) 12.0 - 15.0 g/dL   HCT 24.8 (L) 36.0 - 46.0 %   MCV 90.5 80.0 - 100.0 fL   MCH 31.0 26.0 - 34.0 pg   MCHC 34.3 30.0 - 36.0 g/dL   RDW 16.5 (H) 11.5 - 15.5 %   Platelets 6 (LL) 150 - 400 K/uL    Comment: Immature Platelet Fraction may be clinically indicated, consider ordering this additional test [id number] CRITICAL VALUE NOTED.  VALUE IS CONSISTENT WITH PREVIOUSLY REPORTED AND CALLED VALUE.    nRBC 2.1 (H) 0.0 - 0.2 %   Neutrophils Relative % 51 %   Neutro Abs 2.4 1.7 - 7.7 K/uL   Lymphocytes Relative 46 %   Lymphs Abs 2.2 0.7 - 4.0 K/uL   Monocytes Relative 2 %   Monocytes Absolute 0.1 0.1 - 1.0 K/uL   Eosinophils Relative 0 %   Eosinophils Absolute 0.0 0.0 - 0.5 K/uL   Basophils Relative 0 %    Basophils Absolute 0.0 0.0 - 0.1 K/uL   Immature Granulocytes 1 %   Abs Immature Granulocytes 0.04 0.00 - 0.07 K/uL    Comment: Performed at [facility], [address] [place], [place] [id number]  Basic metabolic panel     Status: Abnormal   Collection Time: 07/08/19  6:52 AM  Result Value Ref Range   Sodium 135 135 - 145 mmol/L   Potassium 2.6 (LL) 3.5 - 5.1 mmol/L    Comment: CRITICAL RESULT CALLED TO, READ BACK BY AND VERIFIED WITH WANETTE MILES AT 0815 07/08/2019 DAS    Chloride 95 (L) 98 - 111 mmol/L  CO2 27 22 - 32 mmol/L   Glucose, Bld 108 (H) 70 - 99 mg/dL   BUN 9 6 - 20 mg/dL   Creatinine, Ser 0.80 0.44 - 1.00 mg/dL   Calcium 8.2 (L) 8.9 - 10.3 mg/dL   GFR calc non Af Amer >60 >60 mL/min   GFR calc Af Amer >60 >60 mL/min   Anion gap 13 5 - 15    Comment: Performed at [facility], [address] [place], [place] [address]  Lipase, blood     Status: Abnormal   Collection Time: 07/08/19  6:52 AM  Result Value Ref Range   Lipase 140 (H) 11 - 51 U/L    Comment: Performed at [facility], [address] [place], [place] [address]  Lactate dehydrogenase     Status: Abnormal   Collection Time: 07/08/19  6:52 AM  Result Value Ref Range   LDH 401 (H) 98 - 192 U/L    Comment: Performed at [facility], [address] [place], [place] [address]  Protime-INR     Status: None   Collection Time: 07/08/19  6:52 AM  Result Value Ref Range   Prothrombin Time 12.7 11.4 - 15.2 seconds   INR 1.0 0.8 - 1.2    Comment: (NOTE) INR goal varies based on device and disease states. Performed at [facility], [address] [place], [place] [address]   APTT     Status: None   Collection Time: 07/08/19  6:52 AM  Result Value Ref Range   aPTT 29 24 - 36 seconds    Comment: Performed at [facility], [address] [place], [place] [address]  Vitamin B12     Status: None   Collection Time: 07/08/19  6:52 AM  Result Value  Ref Range   Vitamin B-12 189 180 - 914 pg/mL    Comment: (NOTE) This assay is not validated for testing neonatal or myeloproliferative syndrome specimens for Vitamin B12 levels. Performed at [facility], [address] [place], [place] [address]   Folate     Status: Abnormal   Collection Time: 07/08/19  6:52 AM  Result Value Ref Range   Folate 5.5 (L) >5.9 ng/mL    Comment: Performed at [facility], [address] [place], [place] [address]  Fibrinogen     Status: None   Collection Time: 07/08/19  6:52 AM  Result Value Ref Range   Fibrinogen 245 210 - 475 mg/dL    Comment: Performed at [facility], [address] [place], [place] [address]  Magnesium     Status: Abnormal   Collection Time: 07/08/19  6:52 AM  Result Value Ref Range   Magnesium 1.5 (L) 1.7 - 2.4 mg/dL    Comment: Performed at [facility], [address] [place],  [id number]  Prepare Pheresed Platelets     Status: None (Preliminary result)   Collection Time: 07/08/19  8:40 AM  Result Value Ref Range   Unit Number [id number]    Blood Component Type PLTP LR2 PAS    Unit division 00    Status of Unit ISSUED    Transfusion Status      OK TO TRANSFUSE Performed at [facility], [address] [place],  [address]     Current Facility-Administered Medications  Medication Dose Route Frequency Provider Last Rate Last Dose  . 0.9 %  sodium chloride infusion   Intravenous Continuous Harrie Foreman, MD  125 mL/hr at 07/08/19 0526    . acetaminophen (TYLENOL) tablet 650 mg  650 mg Oral Q6H PRN Harrie Foreman, MD       Or  . acetaminophen (TYLENOL) suppository 650 mg  650 mg Rectal Q6H PRN Harrie Foreman, MD   650 mg at 07/07/19 0506  . Chlorhexidine Gluconate Cloth 2 % PADS 6 each  6 each Topical Daily Flora Lipps, MD   6 each at 07/08/19 1100  . chlorpheniramine-HYDROcodone (TUSSIONEX) 10-8 MG/5ML suspension 5 mL  5 mL Oral QHS PRN Harrie Foreman, MD      . cloNIDine (CATAPRES) tablet 0.1 mg  0.1 mg Oral BID Gouru, Aruna, MD      . cyanocobalamin ((VITAMIN B-12)) injection 1,000 mcg  1,000 mcg Intramuscular Daily Gouru, Aruna, MD      . cyclobenzaprine (FLEXERIL) tablet 10 mg  10 mg Oral BID PRN Nicholes Mango, MD   10 mg at 07/08/19 1806  . folic acid (FOLVITE) tablet 1 mg  1 mg Oral Daily Harrie Foreman, MD   1 mg at 07/08/19 0919  . gabapentin (NEURONTIN) capsule 300 mg  300 mg Oral BID PRN Nicholes Mango, MD   300 mg at 07/08/19 1806  . HYDROmorphone (DILAUDID) injection 0.5 mg  0.5 mg Intravenous Q3H PRN Harrie Foreman, MD   0.5 mg at 07/08/19 1806  . iohexol (OMNIPAQUE) 9 MG/ML oral solution 500 mL  500 mL Oral BID PRN Harrie Foreman, MD   500 mL at 07/06/19 1745  . levETIRAcetam (KEPPRA) IVPB 500 mg/100 mL premix  500 mg Intravenous Q12H Alexis Goodell, MD 400 mL/hr at 07/08/19 1156 500 mg at 07/08/19 1156  . LORazepam (ATIVAN) injection 0-4 mg  0-4 mg Intravenous Q4H Harrie Foreman, MD   2 mg at 07/08/19 1618   Followed by  . [START ON 07/10/2019] LORazepam (ATIVAN) injection 0-4 mg  0-4 mg Intravenous Q12H Harrie Foreman, MD   2 mg at 07/08/19 0910  . LORazepam (ATIVAN) injection 1 mg  1 mg Intravenous Q4H PRN Gouru, Aruna, MD   1 mg at 07/07/19 0932  . LORazepam (ATIVAN) tablet 2 mg  2 mg Oral Q6H PRN Harrie Foreman, MD       Or  . LORazepam (ATIVAN) injection 2 mg  2 mg Intravenous Q6H PRN Harrie Foreman, MD   2 mg at 07/08/19 0654  . metoCLOPramide (REGLAN) injection 5 mg  5 mg Intravenous Q6H PRN Lance Coon, MD   5 mg at 07/08/19 0435  . metoprolol tartrate (LOPRESSOR) injection 5 mg  5 mg Intravenous Q4H PRN Gouru, Aruna, MD   5 mg at 07/08/19 1204  . morphine 2 MG/ML injection 2 mg  2 mg Intravenous Q4H PRN Harrie Foreman, MD   2 mg at 07/08/19 1619  . multivitamin with minerals tablet 1 tablet  1 tablet Oral Daily Harrie Foreman, MD   1 tablet at 07/08/19 0919  . naloxone  [facility]) nasal spray 4 mg/0.1 mL  1 spray Nasal Once Nicholes Mango, MD   Stopped at 07/07/19 0830  . nitroGLYCERIN (NITROGLYN) 2 % ointment 0.5 inch  0.5 inch Topical Q6H Harrie Foreman, MD   0.5 inch at 07/08/19 1805  . nitroGLYCERIN (NITROSTAT) SL tablet 0.4 mg  0.4 mg Sublingual Q5 min PRN Gouru, Aruna, MD   0.4 mg at 07/07/19 0815  . ondansetron (ZOFRAN) tablet 4 mg  4 mg Oral Q6H PRN Rosilyn Mings  S, MD       Or  . ondansetron (ZOFRAN) injection 4 mg  4 mg Intravenous Q6H PRN Harrie Foreman, MD   4 mg at 07/08/19 0204  . prochlorperazine (COMPAZINE) injection 10 mg  10 mg Intravenous Q6H PRN Harrie Foreman, MD   10 mg at 07/07/19 1733  . QUEtiapine (SEROQUEL) tablet 300 mg  300 mg Oral BID Lizandra Zakrzewski, Lowry Ram, FNP      . sertraline (ZOLOFT) tablet 150 mg  150 mg Oral Daily Shannyn Jankowiak, Lowry Ram, FNP   150 mg at 07/08/19 1806  . sodium chloride flush (NS) 0.9 % injection 10-40 mL  10-40 mL Intracatheter Q12H Gouru, Aruna, MD   10 mL at 07/08/19 0915  . sodium chloride flush (NS) 0.9 % injection 10-40 mL  10-40 mL Intracatheter PRN Gouru, Aruna, MD      . thiamine (VITAMIN B-1) tablet 100 mg  100 mg Oral Daily Harrie Foreman, MD       Or  . thiamine (B-1) injection 100 mg  100 mg Intravenous Daily Harrie Foreman, MD   100 mg at 07/08/19 [id number]    Musculoskeletal: Strength & Muscle Tone: within normal limits Gait & Station: normal Patient leans: N/A  Psychiatric Specialty Exam: Physical Exam  Nursing note and vitals reviewed. Constitutional: She is oriented to person, place, and time. She appears well-developed and well-nourished.  Respiratory: Effort normal.  Musculoskeletal: Normal range of motion.  Neurological: She is alert and oriented to person, place, and time.    Review of Systems  Constitutional: Negative.   HENT: Negative.   Eyes: Negative.   Respiratory: Negative.   Cardiovascular: Negative.   Gastrointestinal: Negative.   Genitourinary: Negative.    Musculoskeletal: Positive for back pain and myalgias.  Skin: Negative.   Endo/Heme/Allergies: Negative.   Psychiatric/Behavioral: Negative.     Blood pressure (!) 138/97, pulse (!) 117, temperature 99 F (37.2 C), temperature source Oral, resp. rate 20, height 5\' 4"  (1.626 m), weight 74.2 kg, last menstrual period 07/05/2019, SpO2 100 %.Body mass index is 28.08 kg/m.  General Appearance: Casual  Eye Contact:  Good  Speech:  Clear and Coherent and Normal Rate  Volume:  Normal  Mood:  Anxious  Affect:  Congruent  Thought Process:  Coherent and Descriptions of Associations: Intact  Orientation:  Full (Time, Place, and Person)  Thought Content:  WDL  Suicidal Thoughts:  No  Homicidal Thoughts:  No  Memory:  Immediate;   Good Recent;   Good Remote;   Good  Judgement:  Fair  Insight:  Fair  Psychomotor Activity:  Normal  Concentration:  Concentration: Good  Recall:  Good  Fund of Knowledge:  Good  Language:  Good  Akathisia:  No  Handed:  Right  AIMS (if indicated):     Assets:  Communication Skills Desire for Improvement Financial Resources/Insurance Housing Resilience Social Support Transportation  ADL's:  Intact  Cognition:  WNL  Sleep:        Treatment Plan Summary: Continue current medications  Restart Seroquel 300 mg PO BID Restart Zoloft 150 mg PO Daily  Disposition: No evidence of imminent risk to self or others at present.   Patient does not meet criteria for psychiatric inpatient admission.  [place], FNP 07/08/2019 6:18 PM

## 2019-07-08 NOTE — Progress Notes (Signed)
Messaged MD about pts heart rate. She is running sinus tach in 120s. MD to put in new orders. Will continue to monitor.

## 2019-07-08 NOTE — Progress Notes (Signed)
MD placed orders in CIWA orders. Patient was having withdrawal symptoms and asked if I could do an early CIWA score and medication on patient and do it again at 0600, per Dr. Marcille Blanco okay to do so.

## 2019-07-08 NOTE — Consult Note (Signed)
PHARMACY CONSULT NOTE - FOLLOW UP  Pharmacy Consult for Electrolyte Monitoring and Replacement   Recent Labs: Potassium (mmol/L)  Date Value  07/08/2019 2.6 (LL)  07/16/2014 3.6   Magnesium (mg/dL)  Date Value  07/08/2019 1.5 (L)  11/22/2013 1.5 (L)   Calcium (mg/dL)  Date Value  07/08/2019 8.2 (L)   Calcium, Total (mg/dL)  Date Value  07/16/2014 8.9   Albumin (g/dL)  Date Value  07/06/2019 3.5  07/16/2014 3.4   Phosphorus (mg/dL)  Date Value  07/07/2019 3.9  02/03/2013 1.7 (L)   Sodium (mmol/L)  Date Value  07/08/2019 135  07/16/2014 145   Assessment: K = 2.6, Mg = 1.5  Goal of Therapy:  Electrolytes wnl's  Plan:  Patient is receiving 6 runs of KCl IV 67meq, will recheck K @ 1800 per protocol and replenish as needed  Will give Magnesium 2g IV x 1, will f/u Mag level with am labs  Lu Duffel ,PharmD Clinical Pharmacist 07/08/2019 8:59 AM

## 2019-07-08 NOTE — Progress Notes (Signed)
[facility] [place] NAME: Sarah Serrano    MR#:  [id number]  DATE OF BIRTH:  1976-04-06  SUBJECTIVE:  CHIEF COMPLAINT: Patient is searching her hand bag by the time I am entering her room ,complaining of  epigastric abdominal pain radiating to the back.  Denies any active bleeding or bruising.  Denies any blood in her stool.  Platelet count at 6000 today.  No other episodes of seizures noticed  patient is transferred to ICU on 07/07/2019 Back to the floor on 07/08/2019  REVIEW OF SYSTEMS:   cONSTITUTIONAL: Reports weakness.  EYES: No blurred or double vision.  RESPIRATORY: No cough, shortness of breath, or hemoptysis.  CARDIOVASCULAR: No chest pain, orthopnea, edema.  GASTROINTESTINAL: No nausea, vomiting, diarrhea or abdominal pain.  HEMATOLOGY: No anemia, easy bruising or bleeding SKIN: No rash or lesion.  No purpura NEUROLOGIC: No tingling, numbness, weakness.  Psychologic-anxious  DRUG ALLERGIES:  No Known Allergies  VITALS:  Blood pressure 125/82, pulse (!) 125, temperature 98.9 F (37.2 C), temperature source Oral, resp. rate 18, height 5\' 4"  (1.626 m), weight 74.2 kg, last menstrual period 07/05/2019, SpO2 100 %.  PHYSICAL EXAMINATION:  GENERAL:  43 y.o.-year-old patient lying in the bed with no acute distress.  EYES: Pupils equal, round, reactive to light and accommodation. No scleral icterus. Extraocular muscles intact.  HEENT: Head atraumatic, normocephalic. Oropharynx and nasopharynx clear.  NECK:  Supple, no jugular venous distention. No thyroid enlargement, no tenderness.  LUNGS: Normal breath sounds bilaterally, no wheezing, rales,rhonchi or crepitation. No use of accessory muscles of respiration.  CARDIOVASCULAR: S1, S2 normal. No murmurs, rubs, or gallops.  ABDOMEN: Soft, epigastric tenderness no rebound tenderness nondistended. Bowel sounds present.  EXTREMITIES: No pedal edema, cyanosis, or clubbing.   NEUROLOGIC: Awake and alert in between the seizure episodes and answering few questions gait not checked for safety PSYCHIATRIC: The patient is alert and talking in between the seizure episodes.  SKIN: No obvious rash, lesion, or ulcer.  No bruises noticed   LABORATORY PANEL:   CBC Recent Labs  Lab 07/08/19 0652  WBC 5.3  HGB 8.5*  HCT 24.8*  PLT 6*   ------------------------------------------------------------------------------------------------------------------  Chemistries  Recent Labs  Lab 07/06/19 1817  07/08/19 0652  NA 136  --  135  K 3.5  --  2.6*  CL 94*  --  95*  CO2 22  --  27  GLUCOSE 121*  --  108*  BUN 10  --  9  CREATININE 0.40*  --  0.80  CALCIUM 9.0  --  8.2*  MG  --    < > 1.5*  AST 77*  --   --   ALT 21  --   --   ALKPHOS 73  --   --   BILITOT 1.1  --   --    < > = values in this interval not displayed.   ------------------------------------------------------------------------------------------------------------------  Cardiac Enzymes No results for input(s): TROPONINI in the last 168 hours. ------------------------------------------------------------------------------------------------------------------  RADIOLOGY:  Ct Head Wo Contrast  Result Date: 07/07/2019 CLINICAL DATA:  Patient found down this morning after a fall. Initial encounter. EXAM: CT HEAD WITHOUT CONTRAST TECHNIQUE: Contiguous axial images were obtained from the base of the skull through the vertex without intravenous contrast. COMPARISON:  Head CT scan 07/16/2014. FINDINGS: Brain: No evidence of acute infarction, hemorrhage, hydrocephalus, extra-axial collection or mass lesion/mass effect. A few calcifications in the basal ganglia bilaterally are unchanged. Vascular: No  hyperdense vessel or unexpected calcification. Skull: Intact.  No focal lesion. Sinuses/Orbits: Negative. Other: None. IMPRESSION: Negative head CT. Electronically Signed   By: Inge Rise M.D.   On: 07/07/2019  09:08   Ct Abdomen Pelvis W Contrast  Result Date: 07/06/2019 CLINICAL DATA:  Acute, generalized abdominal pain and vomiting. Shortness of breath. EXAM: CT ABDOMEN AND PELVIS WITH CONTRAST TECHNIQUE: Multidetector CT imaging of the abdomen and pelvis was performed using the standard protocol following bolus administration of intravenous contrast. CONTRAST:  192mL OMNIPAQUE IOHEXOL 300 MG/ML  SOLN COMPARISON:  07/16/2014. FINDINGS: Lower chest: Unremarkable. Hepatobiliary: Marked diffuse low density of the liver relative to the spleen. The liver is enlarged, most pronounced involving the lateral segment left lobe and caudate lobe. Mild diffuse gallbladder wall enhancement without thickening or pericholecystic fluid. Pancreas: Diffuse peripancreatic soft tissue stranding and edema. This tracks superiorly and anteriorly. No discrete fluid collections are seen. Spleen: Normal in size without focal abnormality. Adrenals/Urinary Tract: Adrenal glands are unremarkable. Kidneys are normal, without renal calculi, focal lesion, or hydronephrosis. Bladder is unremarkable. Stomach/Bowel: Mild low density distal gastric wall thickening. Normal appearing appendix, small bowel and colon. Vascular/Lymphatic: No significant vascular findings are present. No enlarged abdominal or pelvic lymph nodes. Reproductive: Enlarged, lobulated uterus containing coarse calcifications. Previously, with intravenous contrast, there were corresponding uterine masses. A simple appearing left ovarian cyst has not changed significantly, measuring 4.4 x 2.3 cm today on image number 62 series 2, previously 3.8 x 2.4 cm. Normal appearing right ovary containing a small follicular cyst. Other: Tiny umbilical hernia containing fat. Musculoskeletal: Unremarkable bones. IMPRESSION: 1. Acute pancreatitis without abscess. 2. Mild low density distal gastric wall thickening, most likely secondary to the acute pancreatitis. Concomitant acute gastritis is less  likely. 3. Marked diffuse hepatic steatosis. 4. Hepatomegaly. 5. Stable simple appearing left ovarian cyst. Electronically Signed   By: Claudie Revering M.D.   On: 07/06/2019 21:26   Dg Chest Portable 1 View  Result Date: 07/06/2019 CLINICAL DATA:  PT to ER with c/o vomiting that started last night. Pt states had chest pressure that started prior to vomiting, and now she describes it as hurting. Pt actively vomiting in triage, diaphoretic. Pt states SHOB. EXAM: PORTABLE CHEST 1 VIEW COMPARISON:  Chest radiographs 11/22/2013, 02/16/2012 FINDINGS: Stable cardiomediastinal contours within normal limits. The lungs are clear. No pneumothorax or large pleural effusion. No acute finding in the visualized skeleton. IMPRESSION: No evidence of active disease. Electronically Signed   By: Audie Pinto M.D.   On: 07/06/2019 17:53    EKG:   Orders placed or performed during the hospital encounter of 07/06/19  . EKG 12-Lead  . EKG 12-Lead  . ED EKG  . ED EKG    ASSESSMENT AND PLAN:  #Acute pancreatitis alcohol induced N.p.o., IV fluids Pain medications as needed   supportive treatment CT abdomen has revealed acute pancreatitis with no abscess GI consult placed and notify Dr. Allen Norris he is aware of the consult, he is considering to order CT abdomen to rule out hemorrhagic pancreatitis in view of thrombocytopenia Lipase at 140  #Symptomatic anemia no active bleeding GI consult placed and notified Stool for occult blood Hemoglobin 12.6-10.3-8.5  #Recurrent breakthrough seizures, from alcohol withdrawal IV Ativan stat 1 g IV Keppra given and neurology is recommending to continue Keppra 500 every 12 hours Stat CT head is negative Dr. Doy Mince is following N.p.o. EEG ordered but patient has refused EEG and further work-up  #Hypomagnesemia and hypokalemia replete and recheck Magnesium  at 1.5 IV magnesium ordered  #Malignant hypertension Nitropaste to the anterior chest wall IV labetalol as  needed CT head is negative  #Severe thrombocytopenia with platelet count 9000-6000 No schistocytes Patient has received 1 unit of platelets on 07/07/2019  Ordered 2 more units of platelet transfusion 07/08/2019  appreciate oncology recommendations Checking LDH, haptoglobin and reticulocyte count   Transfer patient to ICU discussed with intensivist Dr. Mortimer Fries , he is agreeable    All the records are reviewed and case discussed with Care Management/Social Workerr. Management plans discussed with the patient, family and they are in agreement.  CODE STATUS: fc   TOTAL CRITICAL CARE TIME TAKING CARE OF THIS PATIENT: 45 minutes.   POSSIBLE D/C IN  ?  DAYS, DEPENDING ON CLINICAL CONDITION.  Note: This dictation was prepared with Dragon dictation along with smaller phrase technology. Any transcriptional errors that result from this process are unintentional.   Nicholes Mango M.D on 07/08/2019 at 12:36 PM  Between 7am to 6pm - Pager - [phone] After 6pm go to [email or web address] - password [email or web address] [facility]  Office  [phone]  CC: Primary care physician; Patient, No Pcp Per

## 2019-07-08 NOTE — Progress Notes (Addendum)
Hematology/Oncology Consult note [facility]  [phone] [phone]  Patient Care Team: Patient, No Pcp Per as PCP - General (General Practice)   Name of the patient: Sarah Serrano  [id number]  44/19/1977   Date of visit: 44/05/2019   Interval history-patient was resting comfortably in her bed and when I woke her up she stated that her abdominal pain was better and in fact she was feeling hungry and wanted to eat.  She was oriented x3    Review of systems- Review of Systems  Constitutional: Positive for malaise/fatigue. Negative for chills, fever and weight loss.  HENT: Negative for congestion, ear discharge and nosebleeds.   Eyes: Negative for blurred vision.  Respiratory: Negative for cough, hemoptysis, sputum production, shortness of breath and wheezing.   Cardiovascular: Negative for chest pain, palpitations, orthopnea and claudication.  Gastrointestinal: Positive for abdominal pain and nausea. Negative for blood in stool, constipation, diarrhea, heartburn, melena and vomiting.  Genitourinary: Negative for dysuria, flank pain, frequency, hematuria and urgency.  Musculoskeletal: Negative for back pain, joint pain and myalgias.  Skin: Negative for rash.  Neurological: Negative for dizziness, tingling, focal weakness, seizures, weakness and headaches.  Endo/Heme/Allergies: Does not bruise/bleed easily.  Psychiatric/Behavioral: Negative for depression and suicidal ideas. The patient does not have insomnia.       No Known Allergies   Past Medical History:  Diagnosis Date  . Alcohol abuse   . Bipolar 1 disorder ([place])   . Substance abuse [facility])      Past Surgical History:  Procedure Laterality Date  . NO PAST SURGERIES      Social History   Socioeconomic History  . Marital status: Single    Spouse name: Not on file  . Number of children: Not on file  . Years of education: Not on file  . Highest education level: Not on  file  Occupational History  . Not on file  Social Needs  . Financial resource strain: Not on file  . Food insecurity    Worry: Not on file    Inability: Not on file  . Transportation needs    Medical: Not on file    Non-medical: Not on file  Tobacco Use  . Smoking status: Current Every Day Smoker    Packs/day: 0.50    Types: Cigarettes  . Smokeless tobacco: Never Used  Substance and Sexual Activity  . Alcohol use: Yes  . Drug use: Yes  . Sexual activity: Not on file  Lifestyle  . Physical activity    Days per week: Not on file    Minutes per session: Not on file  . Stress: Not on file  Relationships  . Social Herbalist on phone: Not on file    Gets together: Not on file    Attends religious service: Not on file    Active member of club or organization: Not on file    Attends meetings of clubs or organizations: Not on file    Relationship status: Not on file  . Intimate partner violence    Fear of current or ex partner: Not on file    Emotionally abused: Not on file    Physically abused: Not on file    Forced sexual activity: Not on file  Other Topics Concern  . Not on file  Social History Narrative  . Not on file    Family History  Problem Relation Age of Onset  . Bipolar disorder Mother  Current Facility-Administered Medications:  .  0.9 %  sodium chloride infusion (Manually program via Guardrails IV Fluids), , Intravenous, Once, Gouru, Aruna, MD .  0.9 %  sodium chloride infusion, , Intravenous, Continuous, Harrie Foreman, MD, Last Rate: 125 mL/hr at 07/08/19 0526 .  acetaminophen (TYLENOL) tablet 650 mg, 650 mg, Oral, Q6H PRN **OR** acetaminophen (TYLENOL) suppository 650 mg, 650 mg, Rectal, Q6H PRN, Harrie Foreman, MD, 650 mg at 07/07/19 0506 .  Chlorhexidine Gluconate Cloth 2 % PADS 6 each, 6 each, Topical, Daily, Flora Lipps, MD, 6 each at 07/07/19 1027 .  chlorpheniramine-HYDROcodone (TUSSIONEX) 10-8 MG/5ML suspension 5 mL, 5 mL,  Oral, QHS PRN, Harrie Foreman, MD .  folic acid (FOLVITE) tablet 1 mg, 1 mg, Oral, Daily, Harrie Foreman, MD, 1 mg at 07/08/19 0919 .  hydrALAZINE (APRESOLINE) injection 20 mg, 20 mg, Intravenous, Q2H PRN, Flora Lipps, MD, 20 mg at 07/07/19 2147 .  HYDROmorphone (DILAUDID) injection 0.5 mg, 0.5 mg, Intravenous, Q3H PRN, Harrie Foreman, MD, 0.5 mg at 07/08/19 0910 .  iohexol (OMNIPAQUE) 9 MG/ML oral solution 500 mL, 500 mL, Oral, BID PRN, Harrie Foreman, MD, 500 mL at 07/06/19 1745 .  ketorolac (TORADOL) 15 MG/ML injection 15 mg, 15 mg, Intravenous, Q8H PRN, Harrie Foreman, MD, 15 mg at 07/08/19 0647 .  labetalol (NORMODYNE) injection 5-10 mg, 5-10 mg, Intravenous, Q2H PRN, Harrie Foreman, MD, 10 mg at 07/07/19 2007 .  levETIRAcetam (KEPPRA) IVPB 500 mg/100 mL premix, 500 mg, Intravenous, Q12H, Alexis Goodell, MD, Stopped at 07/07/19 2227 .  LORazepam (ATIVAN) injection 0-4 mg, 0-4 mg, Intravenous, Q4H, 2 mg at 07/08/19 0436 **FOLLOWED BY** [START ON 07/10/2019] LORazepam (ATIVAN) injection 0-4 mg, 0-4 mg, Intravenous, Q12H, Harrie Foreman, MD, 2 mg at 07/08/19 0910 .  LORazepam (ATIVAN) injection 1 mg, 1 mg, Intravenous, Q4H PRN, Gouru, Aruna, MD, 1 mg at 07/07/19 0932 .  LORazepam (ATIVAN) tablet 2 mg, 2 mg, Oral, Q6H PRN **OR** LORazepam (ATIVAN) injection 2 mg, 2 mg, Intravenous, Q6H PRN, Harrie Foreman, MD, 2 mg at 07/08/19 0654 .  metoCLOPramide (REGLAN) injection 5 mg, 5 mg, Intravenous, Q6H PRN, Lance Coon, MD, 5 mg at 07/08/19 0435 .  morphine 2 MG/ML injection 2 mg, 2 mg, Intravenous, Q4H PRN, Harrie Foreman, MD, 2 mg at 07/08/19 0604 .  multivitamin with minerals tablet 1 tablet, 1 tablet, Oral, Daily, Harrie Foreman, MD, 1 tablet at 07/08/19 0919 .  naloxone [facility]) nasal spray 4 mg/0.1 mL, 1 spray, Nasal, Once, Gouru, Illene Silver, MD, Stopped at 07/07/19 0830 .  nitroGLYCERIN (NITROGLYN) 2 % ointment 0.5 inch, 0.5 inch, Topical, Q6H, Harrie Foreman, MD, 0.5 inch at 07/08/19 0553 .  nitroGLYCERIN (NITROSTAT) SL tablet 0.4 mg, 0.4 mg, Sublingual, Q5 min PRN, Gouru, Aruna, MD, 0.4 mg at 07/07/19 0815 .  ondansetron (ZOFRAN) tablet 4 mg, 4 mg, Oral, Q6H PRN **OR** ondansetron (ZOFRAN) injection 4 mg, 4 mg, Intravenous, Q6H PRN, Harrie Foreman, MD, 4 mg at 07/08/19 0204 .  potassium chloride 10 mEq in 100 mL IVPB, 10 mEq, Intravenous, Q1 Hr x 6, Gouru, Aruna, MD, Last Rate: 100 mL/hr at 07/08/19 0916, 10 mEq at 07/08/19 0916 .  prochlorperazine (COMPAZINE) injection 10 mg, 10 mg, Intravenous, Q6H PRN, Harrie Foreman, MD, 10 mg at 07/07/19 1733 .  sodium chloride flush (NS) 0.9 % injection 10-40 mL, 10-40 mL, Intracatheter, Q12H, Gouru, Aruna, MD, 10 mL at 07/08/19 0915 .  sodium chloride  flush (NS) 0.9 % injection 10-40 mL, 10-40 mL, Intracatheter, PRN, Gouru, Aruna, MD .  thiamine (VITAMIN B-1) tablet 100 mg, 100 mg, Oral, Daily **OR** thiamine (B-1) injection 100 mg, 100 mg, Intravenous, Daily, Harrie Foreman, MD, 100 mg at 07/08/19 0914  Physical exam:  Vitals:   07/08/19 0428 07/08/19 0601 07/08/19 0649 07/08/19 0822  BP: 129/88 131/85 131/85 125/82  Pulse: (!) 128 (!) 126 (!) 126 (!) 125  Resp:    18  Temp:  99.1 F (37.3 C)  98.9 F (37.2 C)  TempSrc:  Oral  Oral  SpO2:  100%  100%  Weight:      Height:       Physical Exam Constitutional:      General: She is not in acute distress. HENT:     Head: Normocephalic and atraumatic.  Eyes:     Pupils: Pupils are equal, round, and reactive to light.  Neck:     Musculoskeletal: Normal range of motion.  Cardiovascular:     Rate and Rhythm: Regular rhythm. Tachycardia present.     Heart sounds: Normal heart sounds.  Pulmonary:     Effort: Pulmonary effort is normal.     Breath sounds: Normal breath sounds.  Abdominal:     General: Bowel sounds are normal.     Palpations: Abdomen is soft.     Comments: Mild diffuse tenderness to palpation overall improved as  compared to yesterday  Skin:    General: Skin is warm and dry.  Neurological:     Mental Status: She is oriented to person, place, and time.      CMP Latest Ref Rng & Units 07/08/2019  Glucose 70 - 99 mg/dL 108(H)  BUN 6 - 20 mg/dL 9  Creatinine 0.44 - 1.00 mg/dL 0.80  Sodium 135 - 145 mmol/L 135  Potassium 3.5 - 5.1 mmol/L 2.6(LL)  Chloride 98 - 111 mmol/L 95(L)  CO2 22 - 32 mmol/L 27  Calcium 8.9 - 10.3 mg/dL 8.2(L)  Total Protein 6.5 - 8.1 g/dL -  Total Bilirubin 0.3 - 1.2 mg/dL -  Alkaline Phos 38 - 126 U/L -  AST 15 - 41 U/L -  ALT 0 - 44 U/L -   CBC Latest Ref Rng & Units 07/08/2019  WBC 4.0 - 10.5 K/uL 5.3  Hemoglobin 12.0 - 15.0 g/dL 8.5(L)  Hematocrit 36.0 - 46.0 % 24.8(L)  Platelets 150 - 400 K/uL 6(LL)    @IMAGES @  Ct Head Wo Contrast  Result Date: 07/07/2019 CLINICAL DATA:  Patient found down this morning after a fall. Initial encounter. EXAM: CT HEAD WITHOUT CONTRAST TECHNIQUE: Contiguous axial images were obtained from the base of the skull through the vertex without intravenous contrast. COMPARISON:  Head CT scan 07/16/2014. FINDINGS: Brain: No evidence of acute infarction, hemorrhage, hydrocephalus, extra-axial collection or mass lesion/mass effect. A few calcifications in the basal ganglia bilaterally are unchanged. Vascular: No hyperdense vessel or unexpected calcification. Skull: Intact.  No focal lesion. Sinuses/Orbits: Negative. Other: None. IMPRESSION: Negative head CT. Electronically Signed   By: Inge Rise M.D.   On: 07/07/2019 09:08   Ct Abdomen Pelvis W Contrast  Result Date: 07/06/2019 CLINICAL DATA:  Acute, generalized abdominal pain and vomiting. Shortness of breath. EXAM: CT ABDOMEN AND PELVIS WITH CONTRAST TECHNIQUE: Multidetector CT imaging of the abdomen and pelvis was performed using the standard protocol following bolus administration of intravenous contrast. CONTRAST:  180mL OMNIPAQUE IOHEXOL 300 MG/ML  SOLN COMPARISON:  07/16/2014. FINDINGS:  Lower chest:  Unremarkable. Hepatobiliary: Marked diffuse low density of the liver relative to the spleen. The liver is enlarged, most pronounced involving the lateral segment left lobe and caudate lobe. Mild diffuse gallbladder wall enhancement without thickening or pericholecystic fluid. Pancreas: Diffuse peripancreatic soft tissue stranding and edema. This tracks superiorly and anteriorly. No discrete fluid collections are seen. Spleen: Normal in size without focal abnormality. Adrenals/Urinary Tract: Adrenal glands are unremarkable. Kidneys are normal, without renal calculi, focal lesion, or hydronephrosis. Bladder is unremarkable. Stomach/Bowel: Mild low density distal gastric wall thickening. Normal appearing appendix, small bowel and colon. Vascular/Lymphatic: No significant vascular findings are present. No enlarged abdominal or pelvic lymph nodes. Reproductive: Enlarged, lobulated uterus containing coarse calcifications. Previously, with intravenous contrast, there were corresponding uterine masses. A simple appearing left ovarian cyst has not changed significantly, measuring 4.4 x 2.3 cm today on image number 62 series 2, previously 3.8 x 2.4 cm. Normal appearing right ovary containing a small follicular cyst. Other: Tiny umbilical hernia containing fat. Musculoskeletal: Unremarkable bones. IMPRESSION: 1. Acute pancreatitis without abscess. 2. Mild low density distal gastric wall thickening, most likely secondary to the acute pancreatitis. Concomitant acute gastritis is less likely. 3. Marked diffuse hepatic steatosis. 4. Hepatomegaly. 5. Stable simple appearing left ovarian cyst. Electronically Signed   By: Claudie Revering M.D.   On: 07/06/2019 21:26   Dg Chest Portable 1 View  Result Date: 07/06/2019 CLINICAL DATA:  PT to ER with c/o vomiting that started last night. Pt states had chest pressure that started prior to vomiting, and now she describes it as hurting. Pt actively vomiting in triage,  diaphoretic. Pt states SHOB. EXAM: PORTABLE CHEST 1 VIEW COMPARISON:  Chest radiographs 11/22/2013, 02/16/2012 FINDINGS: Stable cardiomediastinal contours within normal limits. The lungs are clear. No pneumothorax or large pleural effusion. No acute finding in the visualized skeleton. IMPRESSION: No evidence of active disease. Electronically Signed   By: Audie Pinto M.D.   On: 07/06/2019 17:53     Assessment and plan- Patient is a 43 y.o. female with history of acute alcohol intake admitted for abdominal pain and nausea secondary to acute pancreatitis found to have severe thrombocytopenia  Severe Thrombocytopenia: Suspect this is secondary to acute bone marrow suppression from alcohol intake as well as ongoing pancreatitis.  Her coagulation studies are normal and I therefore do not suspect DIC at this time.  B12 level is pending and folate level is mildly low at 5.5.  Recommend giving her 1 mg folate daily.  It is less likely that thrombocytopenia secondary to ITP and given acute pancreatitis I will still hold off on giving her steroids at this time.  Continue to monitor her platelet counts closely and it is okay to give her 1 unit of platelets today and please check a post platelet count within 30 minutes of transfusion as that was not done yesterday within the requisite timeframe. GI is seeing her today and will likely repeat her ct abdomen. If she is symptomatically improved from pancreatitis, I will consider steroids tomorrow.   There are cases of acute pancreatitis induced TTP. However to make a diagnosis of TTP, we need to see findings of MAHA. Her peripheral smear done yesterday showed no schistocytes. Dr. Reuel Derby has also reviewed her smear today and did not note any schistocytes. I will review it myself later today as well. LDH is mildly elevated at 400 which is nonspecific. Typically in TTP LDH is significantly elevated..  Reticulocyte count done yesterday as well as bilirubin was normal  which argues against hemolysis. Will check IPF tomorrow.    Anemia: hb has dropped from 12.5 on admission to 8.5 today. Although partly it could be dilutional after steroids, it Is still a big drop. conisder getting ct abdomen to r/o retroperitoneal bleed especially given unwitnessed fall 2 days ago. Will check iron studies as well   Hematology will continue to follow.   Visit Diagnosis Severe thrombocytopenia   Dr. Randa Evens, MD, MPH [facility] at [facility] [id number] 07/08/2019 9:33 AM

## 2019-07-08 NOTE — Consult Note (Signed)
PHARMACY CONSULT NOTE - FOLLOW UP  Pharmacy Consult for Electrolyte Monitoring and Replacement   Recent Labs: Potassium (mmol/L)  Date Value  07/08/2019 2.9 (L)  07/16/2014 3.6   Magnesium (mg/dL)  Date Value  07/08/2019 1.5 (L)  11/22/2013 1.5 (L)   Calcium (mg/dL)  Date Value  07/08/2019 8.2 (L)   Calcium, Total (mg/dL)  Date Value  07/16/2014 8.9   Albumin (g/dL)  Date Value  07/06/2019 3.5  07/16/2014 3.4   Phosphorus (mg/dL)  Date Value  07/07/2019 3.9  02/03/2013 1.7 (L)   Sodium (mmol/L)  Date Value  07/08/2019 135  07/16/2014 145   Assessment: 43 year old female with electrolyte abnormality. Pharmacy consulted to manage.  Goal of Therapy:  Electrolytes wnl's  Plan:  K 2.9, slightly improved from 2.6 earlier today after receiving potassium 10 mEq IV x 6 runs. Will add 40 mEq potassium to NS and continue at current rate of 125 ml/hr. Will also give an additional 40 mEq PO x 1 dose. Patient is currently NPO and therefore without nutritional repletion of potassium stores.  Electrolytes with am labs.  Tawnya Crook ,PharmD Clinical Pharmacist 07/08/2019 7:39 PM

## 2019-07-08 NOTE — Progress Notes (Signed)
Subjective: Patient resting comfortably when I enter the room.  On awakening patient she immediately begins to complain of abdominal pain and tongue pain.  No further seizures noted.    Objective: Current vital signs: BP 125/82   Pulse (!) 125   Temp 98.9 F (37.2 C) (Oral)   Resp 18   Ht 5\' 4"  (1.626 m)   Wt 74.2 kg   LMP 07/05/2019 Comment: neg preg test  SpO2 100%   BMI 28.08 kg/m  Vital signs in last 24 hours: Temp:  [96.6 F (35.9 C)-99.9 F (37.7 C)] 98.9 F (37.2 C) (09/08 0822) Pulse Rate:  [89-128] 125 (09/08 0822) Resp:  [12-26] 18 (09/08 0822) BP: (113-193)/(82-145) 125/82 (09/08 0822) SpO2:  [90 %-100 %] 100 % (09/08 0822) Weight:  [74.2 kg] 74.2 kg (09/07 1011)  Intake/Output from previous day: 09/07 0701 - 09/08 0700 In: 1022.6 [I.V.:526.6; Blood:296; IV Piggyback:200] Out: -  Intake/Output this shift: No intake/output data recorded. Nutritional status:  Diet Order            Diet NPO time specified Except for: Ice Chips, Sips with Meds  Diet effective now              Neurologic Exam: Mental Status: Awake and alert.  Speech fluent without evidence of aphasia.  Able to follow 3 step commands without difficulty. Cranial Nerves: II: Visual fields grossly normal, pupils equal, round, reactive to light and accommodation III,IV, VI: ptosis not present, extra-ocular motions intact bilaterally V,VII: smile symmetric, facial light touch sensation normal bilaterally VIII: hearing normal bilaterally IX,X: gag reflex present XI: bilateral shoulder shrug XII: midline tongue extension Motor: Able to move all extremities against gravity.  No focal weakness appreciated.     Lab Results: Basic Metabolic Panel: Recent Labs  Lab 07/06/19 1817 07/07/19 1044 07/08/19 0652  NA 136  --  135  K 3.5  --  2.6*  CL 94*  --  95*  CO2 22  --  27  GLUCOSE 121*  --  108*  BUN 10  --  9  CREATININE 0.40*  --  0.80  CALCIUM 9.0  --  8.2*  MG  --  1.6*  --    PHOS  --  3.9  --     Liver Function Tests: Recent Labs  Lab 07/06/19 1817  AST 77*  ALT 21  ALKPHOS 73  BILITOT 1.1  PROT 8.3*  ALBUMIN 3.5   Recent Labs  Lab 07/06/19 1817 07/08/19 0652  LIPASE 165* 140*   Recent Labs  Lab 07/08/19 0652  AMMONIA 33    CBC: Recent Labs  Lab 07/06/19 1741 07/07/19 0001 07/07/19 0855 07/07/19 1720 07/08/19 0652  WBC 5.0 4.6  --  4.6 5.3  NEUTROABS 3.6  --   --  3.3 2.4  HGB 12.5 12.6  --  10.3* 8.5*  HCT 35.2* 35.1*  --  28.4* 24.8*  MCV 88.4 88.4  --  87.9 90.5  PLT 12* 9* 5* 4* 6*    Cardiac Enzymes: No results for input(s): CKTOTAL, CKMB, CKMBINDEX, TROPONINI in the last 168 hours.  Lipid Panel: No results for input(s): CHOL, TRIG, HDL, CHOLHDL, VLDL, LDLCALC in the last 168 hours.  CBG: Recent Labs  Lab 07/07/19 0814 07/07/19 1013  GLUCAP 278* 170*    Microbiology: Results for orders placed or performed during the hospital encounter of 07/06/19  SARS CORONAVIRUS 2 (TAT 6-24 HRS) Nasopharyngeal Nasopharyngeal Swab     Status: None  Collection Time: 07/07/19  1:34 AM   Specimen: Nasopharyngeal Swab  Result Value Ref Range Status   SARS Coronavirus 2 NEGATIVE NEGATIVE Final    Comment: (NOTE) SARS-CoV-2 target nucleic acids are NOT DETECTED. The SARS-CoV-2 RNA is generally detectable in upper and lower respiratory specimens during the acute phase of infection. Negative results do not preclude SARS-CoV-2 infection, do not rule out co-infections with other pathogens, and should not be used as the sole basis for treatment or other patient management decisions. Negative results must be combined with clinical observations, patient history, and epidemiological information. The expected result is Negative. Fact Sheet for Patients: SugarRoll.be Fact Sheet for Healthcare Providers: https://www.woods-mathews.com/ This test is not yet approved or cleared by the [place]  FDA and  has been authorized for detection and/or diagnosis of SARS-CoV-2 by FDA under an Emergency Use Authorization (EUA). This EUA will remain  in effect (meaning this test can be used) for the duration of the COVID-19 declaration under Section 56 4(b)(1) of the Act, 21 U.S.C. section 360bbb-3(b)(1), unless the authorization is terminated or revoked sooner. Performed at [facility],  [address] [place], [place] [address]   SARS Coronavirus 2 [facility] order, Performed in [facility] hospital lab) Nasopharyngeal     Status: None   Collection Time: 07/07/19 10:16 AM   Specimen: Nasopharyngeal  Result Value Ref Range Status   SARS Coronavirus 2 NEGATIVE NEGATIVE Final    Comment: (NOTE) If result is NEGATIVE SARS-CoV-2 target nucleic acids are NOT DETECTED. The SARS-CoV-2 RNA is generally detectable in upper and lower  respiratory specimens during the acute phase of infection. The lowest  concentration of SARS-CoV-2 viral copies this assay can detect is 250  copies / mL. A negative result does not preclude SARS-CoV-2 infection  and should not be used as the sole basis for treatment or other  patient management decisions.  A negative result may occur with  improper specimen collection / handling, submission of specimen other  than nasopharyngeal swab, presence of viral mutation(s) within the  areas targeted by this assay, and inadequate number of viral copies  (<250 copies / mL). A negative result must be combined with clinical  observations, patient history, and epidemiological information. If result is POSITIVE SARS-CoV-2 target nucleic acids are DETECTED. The SARS-CoV-2 RNA is generally detectable in upper and lower  respiratory specimens dur ing the acute phase of infection.  Positive  results are indicative of active infection with SARS-CoV-2.  Clinical  correlation with patient history and other diagnostic information is  necessary to determine patient infection  status.  Positive results do  not rule out bacterial infection or co-infection with other viruses. If result is PRESUMPTIVE POSTIVE SARS-CoV-2 nucleic acids MAY BE PRESENT.   A presumptive positive result was obtained on the submitted specimen  and confirmed on repeat testing.  While 2019 novel coronavirus  (SARS-CoV-2) nucleic acids may be present in the submitted sample  additional confirmatory testing may be necessary for epidemiological  and / or clinical management purposes  to differentiate between  SARS-CoV-2 and other Sarbecovirus currently known to infect humans.  If clinically indicated additional testing with an alternate test  methodology [phone]) is advised. The SARS-CoV-2 RNA is generally  detectable in upper and lower respiratory sp ecimens during the acute  phase of infection. The expected result is Negative. Fact Sheet for Patients:  StrictlyIdeas.no Fact Sheet for Healthcare Providers: BankingDealers.co.za This test is not yet approved or cleared by the [place] FDA  and has been authorized for detection and/or diagnosis of SARS-CoV-2 by FDA under an Emergency Use Authorization (EUA).  This EUA will remain in effect (meaning this test can be used) for the duration of the COVID-19 declaration under Section 564(b)(1) of the Act, 21 U.S.C. section 360bbb-3(b)(1), unless the authorization is terminated or revoked sooner. Performed at [facility], [address] [place], [place] [address]   MRSA PCR Screening     Status: None   Collection Time: 07/07/19 10:16 AM   Specimen: Nasopharyngeal  Result Value Ref Range Status   MRSA by PCR NEGATIVE NEGATIVE Final    Comment:        The GeneXpert MRSA Assay (FDA approved for NASAL specimens only), is one component of a comprehensive MRSA colonization surveillance program. It is not intended to diagnose MRSA infection nor to guide or monitor treatment  for MRSA infections. Performed at [facility], [address] [place], [place] [address]     Coagulation Studies: Recent Labs    07/07/19 [id number] 07/08/19 0652  LABPROT 12.7 12.7  INR 1.0 1.0    Imaging: Ct Head Wo Contrast  Result Date: 07/07/2019 CLINICAL DATA:  Patient found down this morning after a fall. Initial encounter. EXAM: CT HEAD WITHOUT CONTRAST TECHNIQUE: Contiguous axial images were obtained from the base of the skull through the vertex without intravenous contrast. COMPARISON:  Head CT scan 07/16/2014. FINDINGS: Brain: No evidence of acute infarction, hemorrhage, hydrocephalus, extra-axial collection or mass lesion/mass effect. A few calcifications in the basal ganglia bilaterally are unchanged. Vascular: No hyperdense vessel or unexpected calcification. Skull: Intact.  No focal lesion. Sinuses/Orbits: Negative. Other: None. IMPRESSION: Negative head CT. Electronically Signed   By: Inge Rise M.D.   On: 07/07/2019 09:08   Ct Abdomen Pelvis W Contrast  Result Date: 07/06/2019 CLINICAL DATA:  Acute, generalized abdominal pain and vomiting. Shortness of breath. EXAM: CT ABDOMEN AND PELVIS WITH CONTRAST TECHNIQUE: Multidetector CT imaging of the abdomen and pelvis was performed using the standard protocol following bolus administration of intravenous contrast. CONTRAST:  121mL OMNIPAQUE IOHEXOL 300 MG/ML  SOLN COMPARISON:  07/16/2014. FINDINGS: Lower chest: Unremarkable. Hepatobiliary: Marked diffuse low density of the liver relative to the spleen. The liver is enlarged, most pronounced involving the lateral segment left lobe and caudate lobe. Mild diffuse gallbladder wall enhancement without thickening or pericholecystic fluid. Pancreas: Diffuse peripancreatic soft tissue stranding and edema. This tracks superiorly and anteriorly. No discrete fluid collections are seen. Spleen: Normal in size without focal abnormality. Adrenals/Urinary Tract: Adrenal glands are  unremarkable. Kidneys are normal, without renal calculi, focal lesion, or hydronephrosis. Bladder is unremarkable. Stomach/Bowel: Mild low density distal gastric wall thickening. Normal appearing appendix, small bowel and colon. Vascular/Lymphatic: No significant vascular findings are present. No enlarged abdominal or pelvic lymph nodes. Reproductive: Enlarged, lobulated uterus containing coarse calcifications. Previously, with intravenous contrast, there were corresponding uterine masses. A simple appearing left ovarian cyst has not changed significantly, measuring 4.4 x 2.3 cm today on image number 62 series 2, previously 3.8 x 2.4 cm. Normal appearing right ovary containing a small follicular cyst. Other: Tiny umbilical hernia containing fat. Musculoskeletal: Unremarkable bones. IMPRESSION: 1. Acute pancreatitis without abscess. 2. Mild low density distal gastric wall thickening, most likely secondary to the acute pancreatitis. Concomitant acute gastritis is less likely. 3. Marked diffuse hepatic steatosis. 4. Hepatomegaly. 5. Stable simple appearing left ovarian cyst. Electronically Signed   By: Claudie Revering M.D.   On: 07/06/2019 21:26   Dg  Chest Portable 1 View  Result Date: 07/06/2019 CLINICAL DATA:  PT to ER with c/o vomiting that started last night. Pt states had chest pressure that started prior to vomiting, and now she describes it as hurting. Pt actively vomiting in triage, diaphoretic. Pt states SHOB. EXAM: PORTABLE CHEST 1 VIEW COMPARISON:  Chest radiographs 11/22/2013, 02/16/2012 FINDINGS: Stable cardiomediastinal contours within normal limits. The lungs are clear. No pneumothorax or large pleural effusion. No acute finding in the visualized skeleton. IMPRESSION: No evidence of active disease. Electronically Signed   By: Audie Pinto M.D.   On: 07/06/2019 17:53    Medications:  I have reviewed the patient's current medications. Scheduled: . sodium chloride   Intravenous Once  .  Chlorhexidine Gluconate Cloth  6 each Topical Daily  . folic acid  1 mg Oral Daily  . LORazepam  0-4 mg Intravenous Q4H   Followed by  . [START ON 07/10/2019] LORazepam  0-4 mg Intravenous Q12H  . multivitamin with minerals  1 tablet Oral Daily  . naloxone  1 spray Nasal Once  . nitroGLYCERIN  0.5 inch Topical Q6H  . sodium chloride flush  10-40 mL Intracatheter Q12H  . thiamine  100 mg Oral Daily   Or  . thiamine  100 mg Intravenous Daily    Assessment/Plan: 43 year old female admitted with pancreatitis.  Developed seizures on yesterday.  Patient reports no previous history of seizures.  Started on Keppra.  Discontinued last evening.  Patient has refused EEG and further work up.  Recommendations: 1. Would restart Keppra at 500mg  q 12 hours 2. Seizure precautions     LOS: 1 day   Alexis Goodell, MD Neurology [phone] 07/08/2019  9:08 AM

## 2019-07-08 NOTE — Consult Note (Signed)
Lucilla Lame, MD [facility]  [address] [place], [place] [address] Phone: [phone] Fax : [phone]  Consultation  Referring Provider:     Dr. Margaretmary Eddy Primary Care Physician:  Patient, No Pcp Per Primary Gastroenterologist: Althia Forts         Reason for Consultation:     Pancreatitis  Date of Admission:  07/06/2019 Date of Consultation:  07/08/2019         HPI:   Sarah Serrano is a 43 y.o. female who was admitted with a history of alcohol abuse and presented to the emergency department with nausea and vomiting.  The patient had a positive alcohol level on admission.  The patient also had a CT scan of the abdomen that showed:  IMPRESSION: 1. Acute pancreatitis without abscess. 2. Mild low density distal gastric wall thickening, most likely secondary to the acute pancreatitis. Concomitant acute gastritis is less likely. 3. Marked diffuse hepatic steatosis. 4. Hepatomegaly. 5. Stable simple appearing left ovarian cyst.  The patient had a hematology consult called when the patient was found on admission to have a platelet count of 12 which went down to 9 and was as low as 4 yesterday with a repeat today of 6.  The patient's hemoglobin also went from 12.6 on admission to 10.3 yesterday down to 8.5 today.  The patient has not had any signs of bleeding but is having her.  At the present time as reported by the nurse.  The patient's lipase on admission was 165 with the lipase this morning being 140.  Her INR is 1.0. The patient reports that her pain is in the lower abdomen in the suprapubic area mostly on the right and radiates around to the back on the right side.  She is not reporting any epigastric discomfort.   Past Medical History:  Diagnosis Date   Alcohol abuse    Bipolar 1 disorder ([place])    Substance abuse ([place])     Past Surgical History:  Procedure Laterality Date   NO PAST SURGERIES      Prior to Admission medications   Medication Sig Start Date End Date  Taking? Authorizing Provider  acetaminophen (TYLENOL) 325 MG tablet Take 650 mg by mouth every 6 (six) hours as needed for mild pain or fever.   Yes [provider]  Buprenorphine HCl-Naloxone HCl 8-2 MG FILM Place 1 Film under the tongue 3 (three) times daily.    Yes [provider]  cloNIDine (CATAPRES) 0.1 MG tablet Take 0.1 mg by mouth 2 (two) times daily.   Yes [provider]  cyclobenzaprine (FLEXERIL) 10 MG tablet Take 10 mg by mouth 2 (two) times daily as needed for muscle spasms.   Yes [provider]  gabapentin (NEURONTIN) 300 MG capsule Take 300 mg by mouth 2 (two) times daily as needed (back pain).   Yes [provider]  hydrOXYzine (ATARAX/VISTARIL) 25 MG tablet Take 25 mg by mouth 3 (three) times daily as needed for anxiety.   Yes [provider]  naloxone (NARCAN) 4 MG/0.1ML LIQD nasal spray kit Place 1 spray into the nose as directed.   Yes [provider]  polyethylene glycol (MIRALAX / GLYCOLAX) 17 g packet Take 17 g by mouth daily.   Yes [provider]  QUEtiapine (SEROQUEL) 300 MG tablet Take 300 mg by mouth 2 (two) times daily.   Yes [provider]  sertraline (ZOLOFT) 100 MG tablet Take 150 mg by mouth daily.  Yes [provider]  naproxen (NAPROSYN) 500 MG tablet Take 1 tablet (500 mg total) by mouth 2 (two) times daily with a meal. Patient not taking: Reported on 07/08/2019 11/15/18   Victorino Dike, FNP    Family History  Problem Relation Age of Onset   Bipolar disorder Mother      Social History   Tobacco Use   Smoking status: Current Every Day Smoker    Packs/day: 0.50    Types: Cigarettes   Smokeless tobacco: Never Used  Substance Use Topics   Alcohol use: Yes   Drug use: Yes    Allergies as of 07/06/2019   (No Known Allergies)    Review of Systems:    All systems reviewed and negative except where noted in HPI.   Physical Exam:  Vital signs in last  24 hours: Temp:  [98.1 F (36.7 C)-99.9 F (37.7 C)] 98.9 F (37.2 C) (09/08 0822) Pulse Rate:  [94-128] 125 (09/08 0822) Resp:  [12-22] 18 (09/08 0822) BP: (121-175)/(82-119) 125/82 (09/08 0822) SpO2:  [90 %-100 %] 100 % (09/08 9741)   General:   Pleasant, cooperative in NAD Head:  Normocephalic and atraumatic. Eyes:   No icterus.   Conjunctiva pink. PERRLA. Ears:  Normal auditory acuity. Neck:  Supple; no masses or thyroidomegaly Lungs: Respirations even and unlabored. Lungs clear to auscultation bilaterally.   No wheezes, crackles, or rhonchi.  Heart:  Regular rate and rhythm;  Without murmur, clicks, rubs or gallops Abdomen:  Soft, nondistended, positive tenderness on the lower abdomen that is made worse with flexing the abdominal wall muscles. Normal bowel sounds. No appreciable masses or hepatomegaly.  No rebound or guarding.  Rectal:  Not performed. Msk:  Symmetrical without gross deformities.    Extremities:  Without edema, cyanosis or clubbing. Neurologic:  Alert and oriented x3;  grossly normal neurologically. Skin:  Intact without significant lesions or rashes. Cervical Nodes:  No significant cervical adenopathy. Psych:  Alert and cooperative. Normal affect.  LAB RESULTS: Recent Labs    07/07/19 0001 07/07/19 0855 07/07/19 1720 07/08/19 0652  WBC 4.6  --  4.6 5.3  HGB 12.6  --  10.3* 8.5*  HCT 35.1*  --  28.4* 24.8*  PLT 9* 5* 4* 6*   BMET Recent Labs    07/06/19 1817 07/08/19 0652  NA 136 135  K 3.5 2.6*  CL 94* 95*  CO2 22 27  GLUCOSE 121* 108*  BUN 10 9  CREATININE 0.40* 0.80  CALCIUM 9.0 8.2*   LFT Recent Labs    07/06/19 1817  PROT 8.3*  ALBUMIN 3.5  AST 77*  ALT 21  ALKPHOS 73  BILITOT 1.1   PT/INR Recent Labs    07/07/19 0855 07/08/19 0652  LABPROT 12.7 12.7  INR 1.0 1.0    STUDIES: Ct Head Wo Contrast  Result Date: 07/07/2019 CLINICAL DATA:  Patient found down this morning after a fall. Initial encounter. EXAM: CT HEAD  WITHOUT CONTRAST TECHNIQUE: Contiguous axial images were obtained from the base of the skull through the vertex without intravenous contrast. COMPARISON:  Head CT scan 07/16/2014. FINDINGS: Brain: No evidence of acute infarction, hemorrhage, hydrocephalus, extra-axial collection or mass lesion/mass effect. A few calcifications in the basal ganglia bilaterally are unchanged. Vascular: No hyperdense vessel or unexpected calcification. Skull: Intact.  No focal lesion. Sinuses/Orbits: Negative. Other: None. IMPRESSION: Negative head CT. Electronically Signed   By: Inge Rise M.D.   On: 07/07/2019 09:08   Ct Abdomen Pelvis W  Contrast  Result Date: 07/06/2019 CLINICAL DATA:  Acute, generalized abdominal pain and vomiting. Shortness of breath. EXAM: CT ABDOMEN AND PELVIS WITH CONTRAST TECHNIQUE: Multidetector CT imaging of the abdomen and pelvis was performed using the standard protocol following bolus administration of intravenous contrast. CONTRAST:  137m OMNIPAQUE IOHEXOL 300 MG/ML  SOLN COMPARISON:  07/16/2014. FINDINGS: Lower chest: Unremarkable. Hepatobiliary: Marked diffuse low density of the liver relative to the spleen. The liver is enlarged, most pronounced involving the lateral segment left lobe and caudate lobe. Mild diffuse gallbladder wall enhancement without thickening or pericholecystic fluid. Pancreas: Diffuse peripancreatic soft tissue stranding and edema. This tracks superiorly and anteriorly. No discrete fluid collections are seen. Spleen: Normal in size without focal abnormality. Adrenals/Urinary Tract: Adrenal glands are unremarkable. Kidneys are normal, without renal calculi, focal lesion, or hydronephrosis. Bladder is unremarkable. Stomach/Bowel: Mild low density distal gastric wall thickening. Normal appearing appendix, small bowel and colon. Vascular/Lymphatic: No significant vascular findings are present. No enlarged abdominal or pelvic lymph nodes. Reproductive: Enlarged, lobulated  uterus containing coarse calcifications. Previously, with intravenous contrast, there were corresponding uterine masses. A simple appearing left ovarian cyst has not changed significantly, measuring 4.4 x 2.3 cm today on image number 62 series 2, previously 3.8 x 2.4 cm. Normal appearing right ovary containing a small follicular cyst. Other: Tiny umbilical hernia containing fat. Musculoskeletal: Unremarkable bones. IMPRESSION: 1. Acute pancreatitis without abscess. 2. Mild low density distal gastric wall thickening, most likely secondary to the acute pancreatitis. Concomitant acute gastritis is less likely. 3. Marked diffuse hepatic steatosis. 4. Hepatomegaly. 5. Stable simple appearing left ovarian cyst. Electronically Signed   By: SClaudie ReveringM.D.   On: 07/06/2019 21:26   Dg Chest Portable 1 View  Result Date: 07/06/2019 CLINICAL DATA:  PT to ER with c/o vomiting that started last night. Pt states had chest pressure that started prior to vomiting, and now she describes it as hurting. Pt actively vomiting in triage, diaphoretic. Pt states SHOB. EXAM: PORTABLE CHEST 1 VIEW COMPARISON:  Chest radiographs 11/22/2013, 02/16/2012 FINDINGS: Stable cardiomediastinal contours within normal limits. The lungs are clear. No pneumothorax or large pleural effusion. No acute finding in the visualized skeleton. IMPRESSION: No evidence of active disease. Electronically Signed   By: NAudie PintoM.D.   On: 07/06/2019 17:53      Impression / Plan:   Assessment: Principal Problem:   Pancreatitis Active Problems:   Thrombocytopenia ([place]   LDORMA ALTMANis a 43y.o. y/o female with pancreatitis on CT scan and mildly elevated lipase.  The patient has a history of alcohol abuse and recently had a binge.  The patient has lower abdominal pain that is consistent with musculoskeletal pain which is likely from her continued retching and vomiting.  The patient is not vomiting at the present time but states that is  because she is not eating anything.  Her hemoglobin did drop significantly and with a platelet count being so low bleeding is a concern.  Since she is not having any overt signs of bleeding such as hematochezia or hematemesis there may be intra-abdominal sources of bleeding.  Plan: I believe with the patient's anemia with thrombocytopenia and pancreatitis a CT scan is warranted of the abdomen and pelvis.  There is also a question of a unwitnessed fall which was followed up with a head CT on admission.  I have communicated my recommendations for CT scan to the hospitalist.  An intra-abdominal hematoma of the lower abdomen can also cause her  significant musculoskeletal pain and the anemia she is having.  No indication for any endoscopic procedures at this time.  Thank you for involving me in the care of this patient.      LOS: 1 day   Lucilla Lame, MD  07/08/2019, 3:30 PM    Note: This dictation was prepared with Dragon dictation along with smaller phrase technology. Any transcriptional errors that result from this process are unintentional.

## 2019-07-09 ENCOUNTER — Inpatient Hospital Stay: Payer: Medicaid Other

## 2019-07-09 DIAGNOSIS — Z1339 Encounter for screening examination for other mental health and behavioral disorders: Secondary | ICD-10-CM

## 2019-07-09 DIAGNOSIS — D61818 Other pancytopenia: Secondary | ICD-10-CM

## 2019-07-09 DIAGNOSIS — F3189 Other bipolar disorder: Secondary | ICD-10-CM

## 2019-07-09 DIAGNOSIS — R410 Disorientation, unspecified: Secondary | ICD-10-CM

## 2019-07-09 DIAGNOSIS — Z0279 Encounter for issue of other medical certificate: Secondary | ICD-10-CM | POA: Diagnosis not present

## 2019-07-09 DIAGNOSIS — Z0189 Encounter for other specified special examinations: Secondary | ICD-10-CM | POA: Diagnosis not present

## 2019-07-09 DIAGNOSIS — Z0289 Encounter for other administrative examinations: Secondary | ICD-10-CM

## 2019-07-09 LAB — HEMOGLOBIN A1C
Hgb A1c MFr Bld: 5.4 % (ref 4.8–5.6)
Mean Plasma Glucose: 108 mg/dL

## 2019-07-09 LAB — BPAM PLATELET PHERESIS
Blood Product Expiration Date: 202009102359
ISSUE DATE / TIME: 202009081709
Unit Type and Rh: 6200

## 2019-07-09 LAB — MAGNESIUM: Magnesium: 2 mg/dL (ref 1.7–2.4)

## 2019-07-09 LAB — CBC WITH DIFFERENTIAL/PLATELET
Abs Immature Granulocytes: 0.03 10*3/uL (ref 0.00–0.07)
Basophils Absolute: 0 10*3/uL (ref 0.0–0.1)
Basophils Relative: 0 %
Eosinophils Absolute: 0 10*3/uL (ref 0.0–0.5)
Eosinophils Relative: 1 %
HCT: 20.5 % — ABNORMAL LOW (ref 36.0–46.0)
Hemoglobin: 7.1 g/dL — ABNORMAL LOW (ref 12.0–15.0)
Immature Granulocytes: 1 %
Lymphocytes Relative: 50 %
Lymphs Abs: 1.5 10*3/uL (ref 0.7–4.0)
MCH: 31.6 pg (ref 26.0–34.0)
MCHC: 34.6 g/dL (ref 30.0–36.0)
MCV: 91.1 fL (ref 80.0–100.0)
Monocytes Absolute: 0.1 10*3/uL (ref 0.1–1.0)
Monocytes Relative: 4 %
Neutro Abs: 1.3 10*3/uL — ABNORMAL LOW (ref 1.7–7.7)
Neutrophils Relative %: 44 %
Platelets: 8 10*3/uL — CL (ref 150–400)
RBC: 2.25 MIL/uL — ABNORMAL LOW (ref 3.87–5.11)
RDW: 16.6 % — ABNORMAL HIGH (ref 11.5–15.5)
WBC: 2.9 10*3/uL — ABNORMAL LOW (ref 4.0–10.5)
nRBC: 1.4 % — ABNORMAL HIGH (ref 0.0–0.2)

## 2019-07-09 LAB — CBC
HCT: 23.6 % — ABNORMAL LOW (ref 36.0–46.0)
Hemoglobin: 8.3 g/dL — ABNORMAL LOW (ref 12.0–15.0)
MCH: 31.4 pg (ref 26.0–34.0)
MCHC: 35.2 g/dL (ref 30.0–36.0)
MCV: 89.4 fL (ref 80.0–100.0)
Platelets: 15 10*3/uL — CL (ref 150–400)
RBC: 2.64 MIL/uL — ABNORMAL LOW (ref 3.87–5.11)
RDW: 15.9 % — ABNORMAL HIGH (ref 11.5–15.5)
WBC: 2.9 10*3/uL — ABNORMAL LOW (ref 4.0–10.5)
nRBC: 4.1 % — ABNORMAL HIGH (ref 0.0–0.2)

## 2019-07-09 LAB — PREPARE RBC (CROSSMATCH)

## 2019-07-09 LAB — IMMATURE PLATELET FRACTION: Immature Platelet Fraction: 15.3 % — ABNORMAL HIGH (ref 1.2–8.6)

## 2019-07-09 LAB — TECHNOLOGIST SMEAR REVIEW: Plt Morphology: NORMAL

## 2019-07-09 LAB — COMPREHENSIVE METABOLIC PANEL
ALT: 11 U/L (ref 0–44)
AST: 26 U/L (ref 15–41)
Albumin: 2.7 g/dL — ABNORMAL LOW (ref 3.5–5.0)
Alkaline Phosphatase: 47 U/L (ref 38–126)
Anion gap: 6 (ref 5–15)
BUN: 7 mg/dL (ref 6–20)
CO2: 24 mmol/L (ref 22–32)
Calcium: 7.9 mg/dL — ABNORMAL LOW (ref 8.9–10.3)
Chloride: 107 mmol/L (ref 98–111)
Creatinine, Ser: 0.51 mg/dL (ref 0.44–1.00)
GFR calc Af Amer: >60 mL/min (ref >60)
GFR calc non Af Amer: >60 mL/min (ref >60)
Glucose, Bld: 101 mg/dL — ABNORMAL HIGH (ref 70–99)
Potassium: 3.6 mmol/L (ref 3.5–5.1)
Sodium: 137 mmol/L (ref 135–145)
Total Bilirubin: 1 mg/dL (ref 0.3–1.2)
Total Protein: 6.6 g/dL (ref 6.5–8.1)

## 2019-07-09 LAB — HAPTOGLOBIN: Haptoglobin: 68 mg/dL (ref 42–296)

## 2019-07-09 LAB — HIV ANTIBODY (ROUTINE TESTING W REFLEX): HIV Screen 4th Generation wRfx: NONREACTIVE

## 2019-07-09 LAB — PREPARE PLATELET PHERESIS: Unit division: 0

## 2019-07-09 LAB — FERRITIN: Ferritin: 319 ng/mL — ABNORMAL HIGH (ref 11–307)

## 2019-07-09 LAB — IRON AND TIBC
Iron: 20 ug/dL — ABNORMAL LOW (ref 28–170)
Saturation Ratios: 10 % — ABNORMAL LOW (ref 10.4–31.8)
TIBC: 197 ug/dL — ABNORMAL LOW (ref 250–450)
UIBC: 177 ug/dL

## 2019-07-09 LAB — LIPASE, BLOOD: Lipase: 89 U/L — ABNORMAL HIGH (ref 11–51)

## 2019-07-09 LAB — LACTATE DEHYDROGENASE: LDH: 325 U/L — ABNORMAL HIGH (ref 98–192)

## 2019-07-09 MED ORDER — PREDNISONE 50 MG PO TABS
80.0000 mg | ORAL_TABLET | Freq: Every day | ORAL | Status: DC
  Administered 2019-07-09 – 2019-07-10 (×2): 80 mg via ORAL
  Filled 2019-07-09 (×2): qty 1

## 2019-07-09 MED ORDER — POTASSIUM CHLORIDE CRYS ER 20 MEQ PO TBCR
20.0000 meq | EXTENDED_RELEASE_TABLET | Freq: Once | ORAL | Status: AC
  Administered 2019-07-09: 11:00:00 20 meq via ORAL
  Filled 2019-07-09: qty 1

## 2019-07-09 MED ORDER — SODIUM CHLORIDE 0.9% IV SOLUTION
Freq: Once | INTRAVENOUS | Status: AC
  Administered 2019-07-09: 14:00:00 via INTRAVENOUS

## 2019-07-09 MED ORDER — SODIUM CHLORIDE 0.9 % IV SOLN
INTRAVENOUS | Status: DC
  Administered 2019-07-10: 10:00:00 via INTRAVENOUS

## 2019-07-09 MED ORDER — IOHEXOL 300 MG/ML  SOLN
100.0000 mL | Freq: Once | INTRAMUSCULAR | Status: AC | PRN
  Administered 2019-07-09: 100 mL via INTRAVENOUS

## 2019-07-09 MED ORDER — PANTOPRAZOLE SODIUM 20 MG PO TBEC
20.0000 mg | DELAYED_RELEASE_TABLET | Freq: Every day | ORAL | Status: DC
  Administered 2019-07-09 – 2019-07-14 (×6): 20 mg via ORAL
  Filled 2019-07-09 (×7): qty 1

## 2019-07-09 MED ORDER — METOPROLOL TARTRATE 25 MG PO TABS
25.0000 mg | ORAL_TABLET | Freq: Two times a day (BID) | ORAL | Status: DC
  Administered 2019-07-09 (×2): 25 mg via ORAL
  Filled 2019-07-09 (×2): qty 1

## 2019-07-09 MED ORDER — SODIUM CHLORIDE 0.9 % IV SOLN
INTRAVENOUS | Status: DC | PRN
  Administered 2019-07-09: 12:00:00 10 mL via INTRAVENOUS

## 2019-07-09 MED ORDER — IOHEXOL 9 MG/ML PO SOLN
500.0000 mL | Freq: Once | ORAL | Status: DC | PRN
  Administered 2019-07-09: 500 mL via ORAL
  Filled 2019-07-09: qty 500

## 2019-07-09 NOTE — Progress Notes (Signed)
This patient had a drop in her hemoglobin and a CT scan was ordered.  The patient's previously seen mild pancreatitis has improved over the last 3 days.  The patient's thrombocytopenia remains a problem.  With the improvement of the patient's pancreatitis and continued thrombocytopenia I believe that the benefit of steroids outweighs the risk.  I discussed this with Dr. Janese Banks this morning.  The patient abdominal pain is more consistent with musculoskeletal pain with pain in the lower abdomen radiating to the lower back and not in the epigastric area radiating to the area between the shoulder blades.  I will continue to follow the patient during this admission.

## 2019-07-09 NOTE — Progress Notes (Signed)
Pt sitting at foot of bed.  Refusing to get back in bed.  Explained to pt that her hemoglobin is low and that I am trying to lower her heart rate.  Getting up and ambulating is not in her best interest and that she could faint or fall.  Pt states that she is not as bad as I am making it out to be.  I called the [facility] and she came to see the pt.  Pt states she wants to go outside to smoke.  Explained to pt that is not allowed.  Requested that pt lay back in bed so that I can give her ativan to help her relax.  Pt still refuses to lay back in bed.  Bed alarm is on lowest setting as pt is sitting on edge of bed.  Dorna Bloom RN

## 2019-07-09 NOTE — Progress Notes (Signed)
Patient has been irritable for most of the day, frequently jumping up and stating that she is going to leave.  Patient also told nursing student that responded to bed alarm that she was able to light a cigarette in the room.  This nurse in room to get cigarettes from patient which she refused to do.  Ok Edwards, charge nurse in room as well and finally was able to get patient to give her the cigarettes.  Patient later apologized to this nurse for her behavior.  Psych consult to assess if patient is competent to make medical decisions since she continues to state that she wants to leave at times.  Patient currently has platelets infusing.  Clarise Cruz, BSN

## 2019-07-09 NOTE — Progress Notes (Signed)
Patient eating sour patch candy out of her purse.  Educated patient that she is supposed to be NPO for a test.  Patient states that no one has explained this to her however patient was told multiple times during the night by nurse and [facility].  Clarise Cruz, BSN

## 2019-07-09 NOTE — Consult Note (Signed)
[facility] Consult   Reason for Consult:  Evaluation for capacity Referring Physician:  EDP Patient Identification: Sarah Serrano MRN:  [id number] Principal Diagnosis: Pancreatitis Diagnosis:  Principal Problem:   Pancreatitis Active Problems:   Severe thrombocytopenia ([place])   Anemia   Bipolar disorder ([place])   Pancytopenia ([place])   Encounter for competency evaluation   Total Time spent with patient: 30 minutes  Subjective:   Sarah Serrano is a 43 y.o. female patient reports that she is having some pain today.  Patient then states that she fell yesterday and it was because that she tripped over a goat.  Patient reports then that she became upset with 1 of the staff because they were using her lipstick and asked why she wanted to leave.  Patient states that it is July 07, 1923.  HPI:  Per Dr. Cinda Quest: 43 y.o.femalewho reports epigastric pain starting last night with a lot of vomiting. This pain radiates to her back. She gets very sweaty when she vomits. Nurse reported she was soaking wet when she began vomiting in the emergency room. Pain comes up into the chest somewhat. Makes her short of breath. It hurts a little bit when she breathes as well. Pain is moderately severe deep and achy. She is not complaining of fever. No blood in the vomit. No diarrhea. The pain makes her short of breath.  Patient is also diagnosed with severe thrombocytopenia and suspected bone marrow suppression.  Patient was seen by me face-to-face.  I was requested to assess the capacity of this patient for medical decision-making.  I assessed the patient yesterday and she was completely alert and oriented.  She appeared to be slightly irritated due to the questioning that she received but answered all questions appropriately.  Today the patient has disorganized thought, memory loss, confusion, and it is been reported that the patient has been talking to herself when she is in the  room alone.  Nursing staff reported that at 1 time she got out of bed and went looking for someone that she stated had been in her room and there was no one in there.  I was also contacted by secure chat due to the patient's thrombocytopenia and that the possibility of her sertraline can worsen thrombocytopenia.  Sertraline was discontinued at this time but recommended to continue the Seroquel at 300 mg p.o. twice daily.  At this time the patient does not meet criteria for capacity and if needed due to concerns for patient's confusion and her medical condition she can be IVC'd if needed  Past Psychiatric History: Bipolar disorder  Risk to Self:   Risk to Others:   Prior Inpatient Therapy:   Prior Outpatient Therapy:    Past Medical History:  Past Medical History:  Diagnosis Date  . Alcohol abuse   . Bipolar 1 disorder ([place])   . Substance abuse [facility])     Past Surgical History:  Procedure Laterality Date  . NO PAST SURGERIES     Family History:  Family History  Problem Relation Age of Onset  . Bipolar disorder Mother    Family Psychiatric  History: See above Social History:  Social History   Substance and Sexual Activity  Alcohol Use Yes     Social History   Substance and Sexual Activity  Drug Use Yes    Social History   Socioeconomic History  . Marital status: Single    Spouse name: Not on file  . Number of children: Not  on file  . Years of education: Not on file  . Highest education level: Not on file  Occupational History  . Not on file  Social Needs  . Financial resource strain: Not on file  . Food insecurity    Worry: Not on file    Inability: Not on file  . Transportation needs    Medical: Not on file    Non-medical: Not on file  Tobacco Use  . Smoking status: Current Every Day Smoker    Packs/day: 0.50    Types: Cigarettes  . Smokeless tobacco: Never Used  Substance and Sexual Activity  . Alcohol use: Yes  . Drug use: Yes  . Sexual activity: Not on  file  Lifestyle  . Physical activity    Days per week: Not on file    Minutes per session: Not on file  . Stress: Not on file  Relationships  . Social Herbalist on phone: Not on file    Gets together: Not on file    Attends religious service: Not on file    Active member of club or organization: Not on file    Attends meetings of clubs or organizations: Not on file    Relationship status: Not on file  Other Topics Concern  . Not on file  Social History Narrative  . Not on file   Additional Social History:    Allergies:  No Known Allergies  Labs:  Results for orders placed or performed during the hospital encounter of 07/06/19 (from the past 48 hour(s))  Ammonia     Status: None   Collection Time: 07/08/19  6:52 AM  Result Value Ref Range   Ammonia 33 9 - 35 umol/L    Comment: Performed at [facility], [address] [place], [place] [address]  CBC with Differential/Platelet     Status: Abnormal   Collection Time: 07/08/19  6:52 AM  Result Value Ref Range   WBC 5.3 4.0 - 10.5 K/uL   RBC 2.74 (L) 3.87 - 5.11 MIL/uL   Hemoglobin 8.5 (L) 12.0 - 15.0 g/dL   HCT 24.8 (L) 36.0 - 46.0 %   MCV 90.5 80.0 - 100.0 fL   MCH 31.0 26.0 - 34.0 pg   MCHC 34.3 30.0 - 36.0 g/dL   RDW 16.5 (H) 11.5 - 15.5 %   Platelets 6 (LL) 150 - 400 K/uL    Comment: Immature Platelet Fraction may be clinically indicated, consider ordering this additional test [id number] CRITICAL VALUE NOTED.  VALUE IS CONSISTENT WITH PREVIOUSLY REPORTED AND CALLED VALUE.    nRBC 2.1 (H) 0.0 - 0.2 %   Neutrophils Relative % 51 %   Neutro Abs 2.4 1.7 - 7.7 K/uL   Lymphocytes Relative 46 %   Lymphs Abs 2.2 0.7 - 4.0 K/uL   Monocytes Relative 2 %   Monocytes Absolute 0.1 0.1 - 1.0 K/uL   Eosinophils Relative 0 %   Eosinophils Absolute 0.0 0.0 - 0.5 K/uL   Basophils Relative 0 %   Basophils Absolute 0.0 0.0 - 0.1 K/uL   Immature Granulocytes 1 %   Abs Immature Granulocytes 0.04 0.00 - 0.07  K/uL    Comment: Performed at [facility], [address] [place], [place] [id number]  Basic metabolic panel     Status: Abnormal   Collection Time: 07/08/19  6:52 AM  Result Value Ref Range   Sodium 135 135 - 145 mmol/L   Potassium 2.6 (LL) 3.5 -  5.1 mmol/L    Comment: CRITICAL RESULT CALLED TO, READ BACK BY AND VERIFIED WITH WANETTE MILES AT 0815 07/08/2019 DAS    Chloride 95 (L) 98 - 111 mmol/L   CO2 27 22 - 32 mmol/L   Glucose, Bld 108 (H) 70 - 99 mg/dL   BUN 9 6 - 20 mg/dL   Creatinine, Ser 0.80 0.44 - 1.00 mg/dL   Calcium 8.2 (L) 8.9 - 10.3 mg/dL   GFR calc non Af Amer >60 >60 mL/min   GFR calc Af Amer >60 >60 mL/min   Anion gap 13 5 - 15    Comment: Performed at [facility], [address] [place], [place] [address]  Lipase, blood     Status: Abnormal   Collection Time: 07/08/19  6:52 AM  Result Value Ref Range   Lipase 140 (H) 11 - 51 U/L    Comment: Performed at [facility], [address] [place], [place] [address]  Lactate dehydrogenase     Status: Abnormal   Collection Time: 07/08/19  6:52 AM  Result Value Ref Range   LDH 401 (H) 98 - 192 U/L    Comment: Performed at [facility], [address] [place], [place] [address]  Protime-INR     Status: None   Collection Time: 07/08/19  6:52 AM  Result Value Ref Range   Prothrombin Time 12.7 11.4 - 15.2 seconds   INR 1.0 0.8 - 1.2    Comment: (NOTE) INR goal varies based on device and disease states. Performed at [facility], [address] [place], [place] [address]   APTT     Status: None   Collection Time: 07/08/19  6:52 AM  Result Value Ref Range   aPTT 29 24 - 36 seconds    Comment: Performed at [facility], [address] [place], [place] [address]  Vitamin B12     Status: None   Collection Time: 07/08/19  6:52 AM  Result Value Ref Range   Vitamin B-12 189 180 - 914 pg/mL    Comment: (NOTE) This assay is not validated for testing neonatal  or myeloproliferative syndrome specimens for Vitamin B12 levels. Performed at [facility], [place] [address], [place], [place] [address]   Folate     Status: Abnormal   Collection Time: 07/08/19  6:52 AM  Result Value Ref Range   Folate 5.5 (L) >5.9 ng/mL    Comment: Performed at [facility], [address] [place], [place] [address]  Fibrinogen     Status: None   Collection Time: 07/08/19  6:52 AM  Result Value Ref Range   Fibrinogen 245 210 - 475 mg/dL    Comment: Performed at [facility], [address] [place], [place] [address]  Magnesium     Status: Abnormal   Collection Time: 07/08/19  6:52 AM  Result Value Ref Range   Magnesium 1.5 (L) 1.7 - 2.4 mg/dL    Comment: Performed at [facility], [address] [place], [place] [address]  Prepare Pheresed Platelets     Status: None   Collection Time: 07/08/19  8:40 AM  Result Value Ref Range   Unit Number [id number]    Blood Component Type PLTP LR2 PAS    Unit division 00    Status of Unit ISSUED,FINAL    Transfusion Status      OK TO TRANSFUSE Performed at [facility], [address] [place], [place] [address]  Potassium     Status: Abnormal   Collection Time: 07/08/19  6:55 PM  Result Value Ref Range   Potassium 2.9 (L) 3.5 - 5.1 mmol/L    Comment: Performed at [facility], [address] [place], [place] [address]  Platelet count     Status: Abnormal   Collection Time: 07/08/19  7:00 PM  Result Value Ref Range   Platelets 7 (LL) 150 - 400 K/uL    Comment: Immature Platelet Fraction may be clinically indicated, consider ordering this additional test [id number] CRITICAL VALUE NOTED.  VALUE IS CONSISTENT WITH PREVIOUSLY REPORTED AND CALLED VALUE. Performed at [facility], [address] [place], [place] [address]   CBC with Differential/Platelet     Status: Abnormal   Collection Time: 07/09/19  4:20 AM  Result Value Ref Range   WBC 2.9 (L)  4.0 - 10.5 K/uL   RBC 2.25 (L) 3.87 - 5.11 MIL/uL   Hemoglobin 7.1 (L) 12.0 - 15.0 g/dL   HCT 20.5 (L) 36.0 - 46.0 %   MCV 91.1 80.0 - 100.0 fL   MCH 31.6 26.0 - 34.0 pg   MCHC 34.6 30.0 - 36.0 g/dL   RDW 16.6 (H) 11.5 - 15.5 %   Platelets 8 (LL) 150 - 400 K/uL    Comment: CRITICAL VALUE NOTED.  VALUE IS CONSISTENT WITH PREVIOUSLY REPORTED AND CALLED VALUE.   nRBC 1.4 (H) 0.0 - 0.2 %   Neutrophils Relative % 44 %   Neutro Abs 1.3 (L) 1.7 - 7.7 K/uL   Lymphocytes Relative 50 %   Lymphs Abs 1.5 0.7 - 4.0 K/uL   Monocytes Relative 4 %   Monocytes Absolute 0.1 0.1 - 1.0 K/uL   Eosinophils Relative 1 %   Eosinophils Absolute 0.0 0.0 - 0.5 K/uL   Basophils Relative 0 %   Basophils Absolute 0.0 0.0 - 0.1 K/uL   Immature Granulocytes 1 %   Abs Immature Granulocytes 0.03 0.00 - 0.07 K/uL    Comment: Performed at [facility], [address] [place], [place] [address]  Lipase, blood     Status: Abnormal   Collection Time: 07/09/19  4:20 AM  Result Value Ref Range   Lipase 89 (H) 11 - 51 U/L    Comment: Performed at [facility], [address], [place], [place] [address]  Magnesium     Status: None   Collection Time: 07/09/19  4:20 AM  Result Value Ref Range   Magnesium 2.0 1.7 - 2.4 mg/dL    Comment: Performed at [facility], [address] [place], [place] [address]  Technologist smear review     Status: None   Collection Time: 07/09/19  4:20 AM  Result Value Ref Range   WBC Morphology MORPHOLOGY UNREMARKABLE    RBC Morphology ANISOCYTOSIS NOTED ON SMEAR    Tech Review Normal platelet morphology     Comment: Performed at [facility], [address] [place], [place] [address]  Iron and TIBC     Status: Abnormal   Collection Time: 07/09/19  4:20 AM  Result Value Ref Range   Iron 20 (L) 28 - 170 ug/dL   TIBC 197 (L) 250 - 450 ug/dL   Saturation Ratios 10 (L) 10.4 - 31.8 %   UIBC 177 ug/dL    Comment: Performed at [facility],  [address] [place], [place] [address]  Ferritin     Status: Abnormal   Collection Time: 07/09/19  4:20 AM  Result Value Ref Range   Ferritin 319 (H) 11 - 307 ng/mL    Comment: Performed at [facility], [address] [place], [place] [address]  Lactate dehydrogenase     Status: Abnormal   Collection Time: 07/09/19  4:20 AM  Result Value Ref Range   LDH 325 (H) 98 - 192 U/L    Comment: Performed at [facility], [address] [place], [place] [address]  Comprehensive metabolic panel     Status: Abnormal   Collection Time: 07/09/19  4:20 AM  Result Value Ref Range   Sodium 137 135 - 145 mmol/L   Potassium 3.6 3.5 - 5.1 mmol/L   Chloride 107 98 - 111 mmol/L   CO2 24 22 - 32 mmol/L   Glucose, Bld 101 (H) 70 - 99 mg/dL   BUN 7 6 - 20 mg/dL   Creatinine, Ser 0.51 0.44 - 1.00 mg/dL   Calcium 7.9 (L) 8.9 - 10.3 mg/dL   Total Protein 6.6 6.5 - 8.1 g/dL   Albumin 2.7 (L) 3.5 - 5.0 g/dL   AST 26 15 - 41 U/L   ALT 11 0 - 44 U/L   Alkaline Phosphatase 47 38 - 126 U/L   Total Bilirubin 1.0 0.3 - 1.2 mg/dL   GFR calc non Af Amer >60 >60 mL/min   GFR calc Af Amer >60 >60 mL/min   Anion gap 6 5 - 15    Comment: Performed at [facility], [place], [place] [address]  Immature Platelet Fraction     Status: Abnormal   Collection Time: 07/09/19  4:20 AM  Result Value Ref Range   Immature Platelet Fraction 15.3 (H) 1.2 - 8.6 %    Comment:        An elevated IPF indicates increased platelet production. A low platelet count with an elevated IPF may be associated with peripheral platelet destruction (e.g. DIC, ITP) or bone marrow recovery (e.g. after chemotherapy or transplant). A low platelet count with a low or non- elevated IPF is consistent with a platelet production disorder. Performed at [facility], [address] [place], [place] [address]   Prepare RBC     Status: None   Collection Time: 07/09/19  8:49 AM  Result  Value Ref Range   Order Confirmation      ORDER PROCESSED BY BLOOD BANK Performed at [facility], [address] [place], [place] [address]   Prepare Pheresed Platelets     Status: None (Preliminary result)   Collection Time: 07/09/19  9:42 AM  Result Value Ref Range   Unit Number [id number]    Blood Component Type PLTPH LI2 PAS    Unit division 00    Status of Unit ISSUED    Transfusion Status      OK TO TRANSFUSE Performed at [facility], [address] [place], [place] [address]     Current Facility-Administered Medications  Medication Dose Route Frequency Provider Last Rate Last Dose  . 0.9 %  sodium chloride infusion   Intravenous PRN Nicholes Mango, MD   Stopped at 07/09/19 1311  . 0.9 % NaCl with KCl 40 mEq / L  infusion   Intravenous Continuous Tawnya Crook, RPH   Stopped at 07/09/19 1441  . acetaminophen (TYLENOL) tablet 650 mg  650 mg Oral Q6H PRN Harrie Foreman, MD   650 mg at 07/09/19 0525   Or  . acetaminophen (TYLENOL) suppository 650 mg  650  mg Rectal Q6H PRN Harrie Foreman, MD   650 mg at 07/07/19 0506  . chlorpheniramine-HYDROcodone (TUSSIONEX) 10-8 MG/5ML suspension 5 mL  5 mL Oral QHS PRN Harrie Foreman, MD      . cyanocobalamin ((VITAMIN B-12)) injection 1,000 mcg  1,000 mcg Intramuscular Daily Gouru, Aruna, MD   1,000 mcg at 07/09/19 1503  . cyclobenzaprine (FLEXERIL) tablet 10 mg  10 mg Oral BID PRN Nicholes Mango, MD   10 mg at 07/09/19 1457  . folic acid (FOLVITE) tablet 1 mg  1 mg Oral Daily Harrie Foreman, MD   1 mg at 07/09/19 1116  . gabapentin (NEURONTIN) capsule 300 mg  300 mg Oral BID PRN Gouru, Aruna, MD   300 mg at 07/09/19 1457  . HYDROmorphone (DILAUDID) injection 0.5 mg  0.5 mg Intravenous Q3H PRN Harrie Foreman, MD   0.5 mg at 07/09/19 1201  . iohexol (OMNIPAQUE) 9 MG/ML oral solution 500 mL  500 mL Oral BID PRN Harrie Foreman, MD   500 mL at 07/06/19 1745  . iohexol (OMNIPAQUE) 9 MG/ML oral  solution 500 mL  500 mL Oral Once PRN Sindy Guadeloupe, MD   500 mL at 07/09/19 0859  . levETIRAcetam (KEPPRA) IVPB 500 mg/100 mL premix  500 mg Intravenous Q12H Alexis Goodell, MD   Stopped at 07/09/19 1218  . LORazepam (ATIVAN) injection 0-4 mg  0-4 mg Intravenous Q4H Harrie Foreman, MD   2 mg at 07/09/19 1542   Followed by  . [START ON 07/10/2019] LORazepam (ATIVAN) injection 0-4 mg  0-4 mg Intravenous Q12H Harrie Foreman, MD   2 mg at 07/08/19 0910  . LORazepam (ATIVAN) injection 1 mg  1 mg Intravenous Q4H PRN Gouru, Aruna, MD   1 mg at 07/07/19 0932  . LORazepam (ATIVAN) tablet 2 mg  2 mg Oral Q6H PRN Harrie Foreman, MD   2 mg at 07/09/19 1116   Or  . LORazepam (ATIVAN) injection 2 mg  2 mg Intravenous Q6H PRN Harrie Foreman, MD   2 mg at 07/09/19 [id number]  . metoCLOPramide (REGLAN) injection 5 mg  5 mg Intravenous Q6H PRN Lance Coon, MD   5 mg at 07/09/19 0526  . metoprolol tartrate (LOPRESSOR) injection 5 mg  5 mg Intravenous Q4H PRN Gouru, Aruna, MD   5 mg at 07/09/19 0554  . metoprolol tartrate (LOPRESSOR) tablet 25 mg  25 mg Oral BID Gouru, Aruna, MD   25 mg at 07/09/19 1456  . morphine 2 MG/ML injection 2 mg  2 mg Intravenous Q4H PRN Harrie Foreman, MD   2 mg at 07/09/19 1509  . multivitamin with minerals tablet 1 tablet  1 tablet Oral Daily Harrie Foreman, MD   1 tablet at 07/09/19 1116  . naloxone [facility]) nasal spray 4 mg/0.1 mL  1 spray Nasal Once Nicholes Mango, MD   Stopped at 07/07/19 0830  . nitroGLYCERIN (NITROGLYN) 2 % ointment 0.5 inch  0.5 inch Topical Q6H Harrie Foreman, MD   0.5 inch at 07/09/19 0526  . nitroGLYCERIN (NITROSTAT) SL tablet 0.4 mg  0.4 mg Sublingual Q5 min PRN Gouru, Aruna, MD   0.4 mg at 07/07/19 0815  . ondansetron (ZOFRAN) tablet 4 mg  4 mg Oral Q6H PRN Harrie Foreman, MD       Or  . ondansetron [facility]) injection 4 mg  4 mg Intravenous Q6H PRN Harrie Foreman, MD   4 mg at 07/08/19 0204  .  pantoprazole (PROTONIX) EC tablet  20 mg  20 mg Oral Daily Sindy Guadeloupe, MD   20 mg at 07/09/19 1649  . predniSONE (DELTASONE) tablet 80 mg  80 mg Oral Q breakfast Sindy Guadeloupe, MD   80 mg at 07/09/19 1649  . prochlorperazine (COMPAZINE) injection 10 mg  10 mg Intravenous Q6H PRN Harrie Foreman, MD   10 mg at 07/09/19 0202  . QUEtiapine (SEROQUEL) tablet 300 mg  300 mg Oral BID Indra Wolters, Lowry Ram, FNP   300 mg at 07/09/19 1117  . sodium chloride flush (NS) 0.9 % injection 10-40 mL  10-40 mL Intracatheter Q12H Gouru, Aruna, MD   10 mL at 07/09/19 1124  . sodium chloride flush (NS) 0.9 % injection 10-40 mL  10-40 mL Intracatheter PRN Gouru, Aruna, MD      . thiamine (VITAMIN B-1) tablet 100 mg  100 mg Oral Daily Harrie Foreman, MD   100 mg at 07/09/19 1116   Or  . thiamine (B-1) injection 100 mg  100 mg Intravenous Daily Harrie Foreman, MD   100 mg at 07/08/19 [id number]    Musculoskeletal: Strength & Muscle Tone: within normal limits Gait & Station: Patient remained in bed during evaluation Patient leans: N/A  Psychiatric Specialty Exam: Physical Exam  Nursing note and vitals reviewed. Constitutional: She is oriented to person, place, and time. She appears well-developed and well-nourished.  Respiratory: Effort normal.  Musculoskeletal: Normal range of motion.  Neurological: She is alert and oriented to person, place, and time.    Review of Systems  Constitutional: Negative.   HENT: Negative.   Eyes: Negative.   Respiratory: Negative.   Cardiovascular: Negative.   Gastrointestinal: Negative.   Genitourinary: Negative.   Musculoskeletal: Positive for back pain.  Skin: Negative.   Neurological: Negative.   Endo/Heme/Allergies: Negative.   Psychiatric/Behavioral: Positive for memory loss.    Blood pressure 115/83, pulse (!) 110, temperature 98 F (36.7 C), temperature source Oral, resp. rate 18, height 5\' 4"  (1.626 m), weight 74.2 kg, last menstrual period 07/05/2019, SpO2 100 %.Body mass index is 28.08  kg/m.  General Appearance: Disheveled  Eye Contact:  Minimal  Speech:  Slow  Volume:  Decreased  Mood:  Irritable  Affect:  Flat  Thought Process:  Disorganized and Descriptions of Associations: Loose  Orientation:  Other:  self  Thought Content:  Delusions and Hallucinations: Auditory Visual  Suicidal Thoughts:  No  Homicidal Thoughts:  No  Memory:  Immediate;   Poor Recent;   Poor Remote;   Poor  Judgement:  Impaired  Insight:  Lacking  Psychomotor Activity:  Decreased  Concentration:  Concentration: Poor  Recall:  Poor  Fund of Knowledge:  Poor  Language:  Poor  Akathisia:  No  Handed:  Right  AIMS (if indicated):     Assets:  Financial Resources/Insurance Housing Social Support  ADL's:  Impaired  Cognition:  Impaired,  Moderate  Sleep:        Treatment Plan Summary: Patient is confused and does not currently have capacity to make medical decisions  Continue Seroquel 300 mg PO BID Discontinue sertraline due to concerns for worsening thrombocytopenia  Disposition: No evidence of imminent risk to self or others at present.   Patient does not meet criteria for psychiatric inpatient admission.  [place], FNP 07/09/2019 5:44 PM

## 2019-07-09 NOTE — Consult Note (Addendum)
PHARMACY CONSULT NOTE - FOLLOW UP  Pharmacy Consult for Electrolyte Monitoring and Replacement   Recent Labs: Potassium (mmol/L)  Date Value  07/09/2019 3.6  07/16/2014 3.6   Magnesium (mg/dL)  Date Value  07/09/2019 2.0  11/22/2013 1.5 (L)   Calcium (mg/dL)  Date Value  07/09/2019 7.9 (L)   Calcium, Total (mg/dL)  Date Value  07/16/2014 8.9   Albumin (g/dL)  Date Value  07/09/2019 2.7 (L)  07/16/2014 3.4   Phosphorus (mg/dL)  Date Value  07/07/2019 3.9  02/03/2013 1.7 (L)   Sodium (mmol/L)  Date Value  07/09/2019 137  07/16/2014 145  Corrected Ca: 8.94  Assessment: 43 year old female with electrolyte abnormality. Pharmacy consulted to manage electrolytes.  Goal of Therapy:  Electrolytes wnl's  Plan:  K 3.6, improved from 2.9 yesterday after receiving potassium 10 mEq IV x 6 runs, 40 mEq potassium added to NS (rate of 125 ml/hr), and Potassium 40 mEq PO x 1 dose. Will order KCL PO 20 mEq x 1 dose. Patient is currently NPO and therefore without nutritional repletion of potassium stores.  Electrolytes with am labs.  Rowland Lathe ,PharmD Clinical Pharmacist 07/09/2019 7:17 AM

## 2019-07-09 NOTE — Progress Notes (Signed)
CT unable to do patient CT biopsy due to patient not being NPO all night, per Dr. Janese Banks will attempt again tomorrow, and patient can eat after abdominal CT this morning.  Will make NPO after midnight again.  Clarise Cruz, BSN

## 2019-07-09 NOTE — Progress Notes (Signed)
Patient refusing to drink second bottle of contrast for abdominal CT, CT made aware and will go ahead with CT with her drinking only one bottle.  Clarise Cruz, BSN

## 2019-07-09 NOTE — Progress Notes (Addendum)
Pt had order for 2 units of platelets on 9/7. Per blood bank pt was only eligible for 1 unit.  Pt was given 1 unit on 9/7  for platelets of 6.  Platelets increased to 7 post transfusion. Pt platelets this morning is 8.  A new order would need to be written for additional platelets today. Dorna Bloom RN

## 2019-07-09 NOTE — Progress Notes (Signed)
[facility] NAME: Sarah Serrano    MR#:  [id number]  DATE OF BIRTH:  January 20, 1976  SUBJECTIVE:  CHIEF COMPLAINT: Patient is made n.p.o. for CT scan, still complaining of lower abdominal pain radiating to her back no other episodes of seizures noticed  patient is transferred to ICU on 07/07/2019 Back to the floor on 07/08/2019  REVIEW OF SYSTEMS:   cONSTITUTIONAL: Reports weakness.  EYES: No blurred or double vision.  RESPIRATORY: No cough, shortness of breath, or hemoptysis.  CARDIOVASCULAR: No chest pain, orthopnea, edema.  GASTROINTESTINAL: No nausea, vomiting, diarrhea or abdominal pain.  HEMATOLOGY: No anemia, easy bruising or bleeding SKIN: No rash or lesion.  No purpura NEUROLOGIC: No tingling, numbness, weakness.  Psychologic-anxious  DRUG ALLERGIES:  No Known Allergies  VITALS:  Blood pressure 116/75, pulse (!) 108, temperature 98.4 F (36.9 C), temperature source Oral, resp. rate 20, height 5' 4"  (1.626 m), weight 74.2 kg, last menstrual period 07/05/2019, SpO2 98 %.  PHYSICAL EXAMINATION:  GENERAL:  43 y.o.-year-old patient lying in the bed with no acute distress.  EYES: Pupils equal, round, reactive to light and accommodation. No scleral icterus. Extraocular muscles intact.  HEENT: Head atraumatic, normocephalic. Oropharynx and nasopharynx clear.  NECK:  Supple, no jugular venous distention. No thyroid enlargement, no tenderness.  LUNGS: Normal breath sounds bilaterally, no wheezing, rales,rhonchi or crepitation. No use of accessory muscles of respiration.  CARDIOVASCULAR: S1, S2 normal. No murmurs, rubs, or gallops.  ABDOMEN: Soft, lower abdominal tenderness no rebound tenderness nondistended. Bowel sounds present.  EXTREMITIES: No pedal edema, cyanosis, or clubbing.  NEUROLOGIC: Awake and alert in between the seizure episodes and answering few questions gait not checked for safety PSYCHIATRIC: The patient is  alert and talking in between the seizure episodes.  SKIN: No obvious rash, lesion, or ulcer.  No bruises noticed   LABORATORY PANEL:   CBC Recent Labs  Lab 07/09/19 0420  WBC 2.9*  HGB 7.1*  HCT 20.5*  PLT 8*   ------------------------------------------------------------------------------------------------------------------  Chemistries  Recent Labs  Lab 07/09/19 0420  NA 137  K 3.6  CL 107  CO2 24  GLUCOSE 101*  BUN 7  CREATININE 0.51  CALCIUM 7.9*  MG 2.0  AST 26  ALT 11  ALKPHOS 47  BILITOT 1.0   ------------------------------------------------------------------------------------------------------------------  Cardiac Enzymes No results for input(s): TROPONINI in the last 168 hours. ------------------------------------------------------------------------------------------------------------------  RADIOLOGY:  Ct Abdomen Pelvis W Contrast  Result Date: 07/09/2019 CLINICAL DATA:  Acute pancreatitis.  Low platelets. EXAM: CT ABDOMEN AND PELVIS WITH CONTRAST TECHNIQUE: Multidetector CT imaging of the abdomen and pelvis was performed using the standard protocol following bolus administration of intravenous contrast. CONTRAST:  148m OMNIPAQUE IOHEXOL 300 MG/ML  SOLN COMPARISON:  CT head July 06, 2019, July 16, 2014 FINDINGS: Lower chest: Mild LEFT basilar atelectasis. Hepatobiliary: Within the lateral segment of LEFT hepatic lobe 2.5 x 2.1 cm lesion is hyperdense and equal intensity to the portal venous phase vascularity. Lesion is unchanged over multiple comparison exams back to 2015. There is overall low-attenuation in liver consistent with hepatic steatosis. The gallbladder and common bile duct are normal. No biliary duct dilatation Pancreas: Minimal inflammation along the body and tail the pancreas which is slightly improved in the 3 day interval. No pancreatic duct dilatation. No organized fluid collections. Spleen: Normal spleen Adrenals/urinary tract: Adrenal  glands and kidneys are normal. The ureters and bladder normal. Stomach/Bowel: Stomach, small bowel, appendix, and cecum are normal.  The colon and rectosigmoid colon are normal. Vascular/Lymphatic: Abdominal aorta is normal caliber. No periportal or retroperitoneal adenopathy. No pelvic adenopathy. Reproductive: Low-attenuation rounded lesion in the RIGHT lateral wall of the uterus measures 6 cm most consistent leiomyoma. This increased in size from 4.7 cm on CT 07/16/2014. Tampon in the vagina. Cystic lesion posterior LEFT of the uterus measuring 4.6 x 2.4 cm (image 66/2) is presumably ovarian cyst. The cystic lesion has simple fluid attenuation and not changed significantly from CT exam 2015. RIGHT ovary normal. Other: No free fluid. Musculoskeletal: No aggressive osseous lesion. IMPRESSION: 1. Improvement in mild pancreatitis compared to CT 3 days prior. No organized fluid collections. 2. Hepatic steatosis. 3. Enhancing lesion in the LEFT hepatic lobe stable of over comparison exams back to 2015. Favor benign vascular lesion. 4. Uterine leiomyoma increased in size from 2015. 5. Cystic lesion in the LEFT adnexa stable from 2015 Electronically Signed   By: Suzy Bouchard M.D.   On: 07/09/2019 11:37    EKG:   Orders placed or performed during the hospital encounter of 07/06/19  . EKG 12-Lead  . EKG 12-Lead  . ED EKG  . ED EKG    ASSESSMENT AND PLAN:  #Severe anemia, thrombocytopenia with platelet count [id number] From bone marrow suppression probably No schistocytes Patient has received 1 unit of platelets on 07/07/2019   platelet transfusion 07/08/2019  Repeat platelet transfusion on 07/09/2019 and RN to check platelet count 30 minutes after transfusion discussed with her appreciate oncology recommendations Checking LDH, haptoglobin and reticulocyte count Discontinued clonidine and Zoloft as recommended by pharmacy which could cause thrombocytopenia Oncology has discussed with the  interventional radiology for bone marrow biopsy which is scheduled for tomorrow.  Patient will be n.p.o. after midnight    #Acute pancreatitis alcohol induced N.p.o., IV fluids Pain medications as needed   supportive treatment CT abdomen has revealed acute pancreatitis with no abscess Repeat CT abdomen on 07/09/2019 revealed improving acute pancreatitis compared to prior study of CT scan.  Enhancing lesion in the left hepatic lobe favoring benign vascular lesion and uterine leiomyoma increased in size from May 2015 GI consult placed and Dr. Allen Norris following  lipase at 140-89  #Symptomatic anemia no active bleeding GI is following Stool for occult blood Hemoglobin 12.6-10.3-8.5-7.1 We will transfuse 1 unit of blood  #Recurrent breakthrough seizures, from alcohol withdrawal IV Ativan stat 1 g IV Keppra given and neurology is recommending to continue Keppra 500 every 12 hours Stat CT head is negative Dr. Doy Mince is following EEG ordered but patient has refused EEG and further work-up  #Hypomagnesemia and hypokalemia replete and recheck Magnesium at 1.5 IV magnesium ordered, repeat magnesium is at 2.0  #Essential hypertension Blood pressure is soft Nitropaste to the anterior chest wall will be discontinued IV labetalol as needed CT head is negative Clonidine discontinued in view of thrombocytopenia, pharmacy is concerned it might cause thrombocytopenia.  Discontinued clonidine and patient restarted on metoprolol will titrate as needed  #Hepatic lobe lesion seems to be benign PCP to follow-up and refer to hepatologist if needed  #Uterine leiomyoma outpatient follow-up with GYN   Transfer patient to ICU discussed with intensivist Dr. Mortimer Fries , he is agreeable    All the records are reviewed and case discussed with Care Management/Social Workerr. Management plans discussed with the patient, family and they are in agreement.  CODE STATUS: fc   TOTAL CRITICAL CARE TIME TAKING CARE  OF THIS PATIENT: 45 minutes.   POSSIBLE D/C IN  ?  DAYS, DEPENDING ON CLINICAL CONDITION.  Note: This dictation was prepared with Dragon dictation along with smaller phrase technology. Any transcriptional errors that result from this process are unintentional.   Nicholes Mango M.D on 07/09/2019 at 1:59 PM  Between 7am to 6pm - Pager - [phone] After 6pm go to [email or web address] - password [email or web address] [facility]  Office  [phone]  CC: Primary care physician; Patient, No Pcp Per

## 2019-07-09 NOTE — Progress Notes (Addendum)
Notified Dr Marcille Blanco about labs and temp of 100.6. Dorna Bloom RN MEWS 3.  Elevated HR, temp, pt with disorientation to place and time. CIWA 14.  Paged Dr Marcille Blanco to discuss.  Pt also complaining of pain. Dorna Bloom RN Per Dr Marcille Blanco give metoprolol and Dilaudid at this time.  Give ativan in 30 minutes. Dorna Bloom RN

## 2019-07-09 NOTE — Progress Notes (Signed)
Hematology/Oncology Consult note [facility]  [phone] [phone]  Patient Care Team: Patient, No Pcp Per as PCP - General (General Practice)   Name of the patient: Sarah Serrano  [id number]  24-Jul-1976   Date of visit: 07/09/2019  Interval history- still complains of abdominal pain mainly lower abdominal pain radiating to her back. Also reports some nausea. No bleeding    Review of systems- Review of Systems  Constitutional: Positive for malaise/fatigue. Negative for chills, fever and weight loss.  HENT: Negative for congestion, ear discharge and nosebleeds.   Eyes: Negative for blurred vision.  Respiratory: Negative for cough, hemoptysis, sputum production, shortness of breath and wheezing.   Cardiovascular: Negative for chest pain, palpitations, orthopnea and claudication.  Gastrointestinal: Positive for abdominal pain and nausea. Negative for blood in stool, constipation, diarrhea, heartburn, melena and vomiting.  Genitourinary: Negative for dysuria, flank pain, frequency, hematuria and urgency.  Musculoskeletal: Negative for back pain, joint pain and myalgias.  Skin: Negative for rash.  Neurological: Negative for dizziness, tingling, focal weakness, seizures, weakness and headaches.  Endo/Heme/Allergies: Does not bruise/bleed easily.  Psychiatric/Behavioral: Negative for depression and suicidal ideas. The patient does not have insomnia.        No Known Allergies   Past Medical History:  Diagnosis Date   Alcohol abuse    Bipolar 1 disorder (HCC)    Substance abuse ([place])      Past Surgical History:  Procedure Laterality Date   NO PAST SURGERIES      Social History   Socioeconomic History   Marital status: Single    Spouse name: Not on file   Number of children: Not on file   Years of education: Not on file   Highest education level: Not on file  Occupational History   Not on file  Social Needs    Financial resource strain: Not on file   Food insecurity    Worry: Not on file    Inability: Not on file   Transportation needs    Medical: Not on file    Non-medical: Not on file  Tobacco Use   Smoking status: Current Every Day Smoker    Packs/day: 0.50    Types: Cigarettes   Smokeless tobacco: Never Used  Substance and Sexual Activity   Alcohol use: Yes   Drug use: Yes   Sexual activity: Not on file  Lifestyle   Physical activity    Days per week: Not on file    Minutes per session: Not on file   Stress: Not on file  Relationships   Social connections    Talks on phone: Not on file    Gets together: Not on file    Attends religious service: Not on file    Active member of club or organization: Not on file    Attends meetings of clubs or organizations: Not on file    Relationship status: Not on file   Intimate partner violence    Fear of current or ex partner: Not on file    Emotionally abused: Not on file    Physically abused: Not on file    Forced sexual activity: Not on file  Other Topics Concern   Not on file  Social History Narrative   Not on file    Family History  Problem Relation Age of Onset   Bipolar disorder Mother      Current Facility-Administered Medications:    0.9 %  sodium chloride infusion, ,  Intravenous, PRN, Gouru, Aruna, MD, Stopped at 07/09/19 1311   0.9 % NaCl with KCl 40 mEq / L  infusion, , Intravenous, Continuous, Tawnya Crook, RPH, Stopped at 07/09/19 1441   acetaminophen (TYLENOL) tablet 650 mg, 650 mg, Oral, Q6H PRN, 650 mg at 07/09/19 0525 **OR** acetaminophen (TYLENOL) suppository 650 mg, 650 mg, Rectal, Q6H PRN, Harrie Foreman, MD, 650 mg at 07/07/19 5784   Chlorhexidine Gluconate Cloth 2 % PADS 6 each, 6 each, Topical, Daily, Kasa, Kurian, MD, 6 each at 07/08/19 1100   chlorpheniramine-HYDROcodone (TUSSIONEX) 10-8 MG/5ML suspension 5 mL, 5 mL, Oral, QHS PRN, Harrie Foreman, MD   cyanocobalamin  ((VITAMIN B-12)) injection 1,000 mcg, 1,000 mcg, Intramuscular, Daily, Gouru, Aruna, MD, 1,000 mcg at 07/09/19 1503   cyclobenzaprine (FLEXERIL) tablet 10 mg, 10 mg, Oral, BID PRN, Gouru, Aruna, MD, 10 mg at 69/62/95 2841   folic acid (FOLVITE) tablet 1 mg, 1 mg, Oral, Daily, Harrie Foreman, MD, 1 mg at 07/09/19 1116   gabapentin (NEURONTIN) capsule 300 mg, 300 mg, Oral, BID PRN, Gouru, Aruna, MD, 300 mg at 07/09/19 1457   HYDROmorphone (DILAUDID) injection 0.5 mg, 0.5 mg, Intravenous, Q3H PRN, Harrie Foreman, MD, 0.5 mg at 07/09/19 1201   iohexol (OMNIPAQUE) 9 MG/ML oral solution 500 mL, 500 mL, Oral, BID PRN, Harrie Foreman, MD, 500 mL at 07/06/19 1745   iohexol (OMNIPAQUE) 9 MG/ML oral solution 500 mL, 500 mL, Oral, Once PRN, Sindy Guadeloupe, MD, 500 mL at 07/09/19 0859   levETIRAcetam (KEPPRA) IVPB 500 mg/100 mL premix, 500 mg, Intravenous, Q12H, Alexis Goodell, MD, Stopped at 07/09/19 1218   LORazepam (ATIVAN) injection 0-4 mg, 0-4 mg, Intravenous, Q4H, 4 mg at 07/08/19 2036 **FOLLOWED BY** [START ON 07/10/2019] LORazepam (ATIVAN) injection 0-4 mg, 0-4 mg, Intravenous, Q12H, Harrie Foreman, MD, 2 mg at 07/08/19 0910   LORazepam (ATIVAN) injection 1 mg, 1 mg, Intravenous, Q4H PRN, Gouru, Aruna, MD, 1 mg at 07/07/19 0932   LORazepam (ATIVAN) tablet 2 mg, 2 mg, Oral, Q6H PRN, 2 mg at 07/09/19 1116 **OR** LORazepam (ATIVAN) injection 2 mg, 2 mg, Intravenous, Q6H PRN, Harrie Foreman, MD, 2 mg at 07/09/19 0620   metoCLOPramide (REGLAN) injection 5 mg, 5 mg, Intravenous, Q6H PRN, Lance Coon, MD, 5 mg at 07/09/19 0526   metoprolol tartrate (LOPRESSOR) injection 5 mg, 5 mg, Intravenous, Q4H PRN, Gouru, Aruna, MD, 5 mg at 07/09/19 0554   metoprolol tartrate (LOPRESSOR) tablet 25 mg, 25 mg, Oral, BID, Gouru, Aruna, MD, 25 mg at 07/09/19 1456   morphine 2 MG/ML injection 2 mg, 2 mg, Intravenous, Q4H PRN, Harrie Foreman, MD, 2 mg at 07/09/19 1509   multivitamin with  minerals tablet 1 tablet, 1 tablet, Oral, Daily, Harrie Foreman, MD, 1 tablet at 07/09/19 1116   naloxone (NARCAN) nasal spray 4 mg/0.1 mL, 1 spray, Nasal, Once, Gouru, Aruna, MD, Stopped at 07/07/19 0830   nitroGLYCERIN (NITROGLYN) 2 % ointment 0.5 inch, 0.5 inch, Topical, Q6H, Harrie Foreman, MD, 0.5 inch at 07/09/19 0526   nitroGLYCERIN (NITROSTAT) SL tablet 0.4 mg, 0.4 mg, Sublingual, Q5 min PRN, Gouru, Aruna, MD, 0.4 mg at 07/07/19 0815   ondansetron (ZOFRAN) tablet 4 mg, 4 mg, Oral, Q6H PRN **OR** ondansetron (ZOFRAN) injection 4 mg, 4 mg, Intravenous, Q6H PRN, Harrie Foreman, MD, 4 mg at 07/08/19 0204   pantoprazole (PROTONIX) EC tablet 20 mg, 20 mg, Oral, Daily, Sindy Guadeloupe, MD   predniSONE (DELTASONE) tablet 80  mg, 80 mg, Oral, Q breakfast, Sindy Guadeloupe, MD   prochlorperazine (COMPAZINE) injection 10 mg, 10 mg, Intravenous, Q6H PRN, Harrie Foreman, MD, 10 mg at 07/09/19 0202   QUEtiapine (SEROQUEL) tablet 300 mg, 300 mg, Oral, BID, Money, Darnelle Maffucci B, FNP, 300 mg at 07/09/19 1117   sodium chloride flush (NS) 0.9 % injection 10-40 mL, 10-40 mL, Intracatheter, Q12H, Gouru, Aruna, MD, 10 mL at 07/09/19 1124   sodium chloride flush (NS) 0.9 % injection 10-40 mL, 10-40 mL, Intracatheter, PRN, Gouru, Aruna, MD   thiamine (VITAMIN B-1) tablet 100 mg, 100 mg, Oral, Daily, 100 mg at 07/09/19 1116 **OR** thiamine (B-1) injection 100 mg, 100 mg, Intravenous, Daily, Harrie Foreman, MD, 100 mg at 07/08/19 0914  Physical exam:  Vitals:   07/09/19 0914 07/09/19 1300 07/09/19 1339 07/09/19 1453  BP: 109/72 117/79 116/75 112/76  Pulse: (!) 110 (!) 108 (!) 108 (!) 126  Resp: _0 Temp: 98.3 F (36.8 C) 98.4 F (36.9 C) 98.4 F (36.9 C) 98.2 F (36.8 C)  TempSrc: Oral Oral Oral Oral  SpO2: 99% 98% 98% 100%  Weight:      Height:       Physical Exam Constitutional:      Comments: Appears drowsy but easily arousable  HENT:     Head: Normocephalic and  atraumatic.  Eyes:     Pupils: Pupils are equal, round, and reactive to light.  Neck:     Musculoskeletal: Normal range of motion.  Cardiovascular:     Rate and Rhythm: Normal rate and regular rhythm.     Heart sounds: Normal heart sounds.  Pulmonary:     Effort: Pulmonary effort is normal.     Breath sounds: Normal breath sounds.  Abdominal:     General: Bowel sounds are normal.     Palpations: Abdomen is soft.     Comments: Mild lower abdominal tenderness   Skin:    General: Skin is warm and dry.  Neurological:     Mental Status: She is oriented to person, place, and time.      CMP Latest Ref Rng & Units 07/09/2019  Glucose 70 - 99 mg/dL 101(H)  BUN 6 - 20 mg/dL 7  Creatinine 0.44 - 1.00 mg/dL 0.51  Sodium 135 - 145 mmol/L 137  Potassium 3.5 - 5.1 mmol/L 3.6  Chloride 98 - 111 mmol/L 107  CO2 22 - 32 mmol/L 24  Calcium 8.9 - 10.3 mg/dL 7.9(L)  Total Protein 6.5 - 8.1 g/dL 6.6  Total Bilirubin 0.3 - 1.2 mg/dL 1.0  Alkaline Phos 38 - 126 U/L 47  AST 15 - 41 U/L 26  ALT 0 - 44 U/L 11   CBC Latest Ref Rng & Units 07/09/2019  WBC 4.0 - 10.5 K/uL 2.9(L)  Hemoglobin 12.0 - 15.0 g/dL 7.1(L)  Hematocrit 36.0 - 46.0 % 20.5(L)  Platelets 150 - 400 K/uL 8(LL)    _1 @  Ct Head Wo Contrast  Result Date: 07/07/2019 CLINICAL DATA:  Patient found down this morning after a fall. Initial encounter. EXAM: CT HEAD WITHOUT CONTRAST TECHNIQUE: Contiguous axial images were obtained from the base of the skull through the vertex without intravenous contrast. COMPARISON:  Head CT scan 07/16/2014. FINDINGS: Brain: No evidence of acute infarction, hemorrhage, hydrocephalus, extra-axial collection or mass lesion/mass effect. A few calcifications in the basal ganglia bilaterally are unchanged. Vascular: No hyperdense vessel or unexpected calcification. Skull: Intact.  No focal lesion. Sinuses/Orbits: Negative. Other: None. IMPRESSION:  Negative head CT. Electronically Signed   By: Inge Rise  M.D.   On: 07/07/2019 09:08   Ct Abdomen Pelvis W Contrast  Result Date: 07/09/2019 CLINICAL DATA:  Acute pancreatitis.  Low platelets. EXAM: CT ABDOMEN AND PELVIS WITH CONTRAST TECHNIQUE: Multidetector CT imaging of the abdomen and pelvis was performed using the standard protocol following bolus administration of intravenous contrast. CONTRAST:  150m OMNIPAQUE IOHEXOL 300 MG/ML  SOLN COMPARISON:  CT head July 06, 2019, July 16, 2014 FINDINGS: Lower chest: Mild LEFT basilar atelectasis. Hepatobiliary: Within the lateral segment of LEFT hepatic lobe 2.5 x 2.1 cm lesion is hyperdense and equal intensity to the portal venous phase vascularity. Lesion is unchanged over multiple comparison exams back to 2015. There is overall low-attenuation in liver consistent with hepatic steatosis. The gallbladder and common bile duct are normal. No biliary duct dilatation Pancreas: Minimal inflammation along the body and tail the pancreas which is slightly improved in the 3 day interval. No pancreatic duct dilatation. No organized fluid collections. Spleen: Normal spleen Adrenals/urinary tract: Adrenal glands and kidneys are normal. The ureters and bladder normal. Stomach/Bowel: Stomach, small bowel, appendix, and cecum are normal. The colon and rectosigmoid colon are normal. Vascular/Lymphatic: Abdominal aorta is normal caliber. No periportal or retroperitoneal adenopathy. No pelvic adenopathy. Reproductive: Low-attenuation rounded lesion in the RIGHT lateral wall of the uterus measures 6 cm most consistent leiomyoma. This increased in size from 4.7 cm on CT 07/16/2014. Tampon in the vagina. Cystic lesion posterior LEFT of the uterus measuring 4.6 x 2.4 cm (image 66/2) is presumably ovarian cyst. The cystic lesion has simple fluid attenuation and not changed significantly from CT exam 2015. RIGHT ovary normal. Other: No free fluid. Musculoskeletal: No aggressive osseous lesion. IMPRESSION: 1. Improvement in mild  pancreatitis compared to CT 3 days prior. No organized fluid collections. 2. Hepatic steatosis. 3. Enhancing lesion in the LEFT hepatic lobe stable of over comparison exams back to 2015. Favor benign vascular lesion. 4. Uterine leiomyoma increased in size from 2015. 5. Cystic lesion in the LEFT adnexa stable from 2015 Electronically Signed   By: SSuzy BouchardM.D.   On: 07/09/2019 11:37   Ct Abdomen Pelvis W Contrast  Result Date: 07/06/2019 CLINICAL DATA:  Acute, generalized abdominal pain and vomiting. Shortness of breath. EXAM: CT ABDOMEN AND PELVIS WITH CONTRAST TECHNIQUE: Multidetector CT imaging of the abdomen and pelvis was performed using the standard protocol following bolus administration of intravenous contrast. CONTRAST:  1063mOMNIPAQUE IOHEXOL 300 MG/ML  SOLN COMPARISON:  07/16/2014. FINDINGS: Lower chest: Unremarkable. Hepatobiliary: Marked diffuse low density of the liver relative to the spleen. The liver is enlarged, most pronounced involving the lateral segment left lobe and caudate lobe. Mild diffuse gallbladder wall enhancement without thickening or pericholecystic fluid. Pancreas: Diffuse peripancreatic soft tissue stranding and edema. This tracks superiorly and anteriorly. No discrete fluid collections are seen. Spleen: Normal in size without focal abnormality. Adrenals/Urinary Tract: Adrenal glands are unremarkable. Kidneys are normal, without renal calculi, focal lesion, or hydronephrosis. Bladder is unremarkable. Stomach/Bowel: Mild low density distal gastric wall thickening. Normal appearing appendix, small bowel and colon. Vascular/Lymphatic: No significant vascular findings are present. No enlarged abdominal or pelvic lymph nodes. Reproductive: Enlarged, lobulated uterus containing coarse calcifications. Previously, with intravenous contrast, there were corresponding uterine masses. A simple appearing left ovarian cyst has not changed significantly, measuring 4.4 x 2.3 cm today on  image number 62 series 2, previously 3.8 x 2.4 cm. Normal appearing right ovary containing a small follicular  cyst. Other: Tiny umbilical hernia containing fat. Musculoskeletal: Unremarkable bones. IMPRESSION: 1. Acute pancreatitis without abscess. 2. Mild low density distal gastric wall thickening, most likely secondary to the acute pancreatitis. Concomitant acute gastritis is less likely. 3. Marked diffuse hepatic steatosis. 4. Hepatomegaly. 5. Stable simple appearing left ovarian cyst. Electronically Signed   By: Claudie Revering M.D.   On: 07/06/2019 21:26   Dg Chest Portable 1 View  Result Date: 07/06/2019 CLINICAL DATA:  PT to ER with c/o vomiting that started last night. Pt states had chest pressure that started prior to vomiting, and now she describes it as hurting. Pt actively vomiting in triage, diaphoretic. Pt states SHOB. EXAM: PORTABLE CHEST 1 VIEW COMPARISON:  Chest radiographs 11/22/2013, 02/16/2012 FINDINGS: Stable cardiomediastinal contours within normal limits. The lungs are clear. No pneumothorax or large pleural effusion. No acute finding in the visualized skeleton. IMPRESSION: No evidence of active disease. Electronically Signed   By: Audie Pinto M.D.   On: 07/06/2019 17:53     Assessment and plan- Patient is a 43 y.o. female with alcohol dependence admitted for acute pancreatitis complicated by anemia and severe thrombocytopenia  1. Severe thrombocytopenia: no evidence of hemolysis. No schistocytes on peripheral smear. This does not fit the picture of pancreatitis induced TTP. Normal coags argues against DIC. There are mainly 2 possibilities:  A. Acute bone marrow suppression from recent events remains high on the differential. However given the worsening anemia I will proceed with bone marrow biopsy tomorrow. Continue supportive platelet transfusions to keep counts >10. 2 prior transfusions have not improved platelet count much.  B. 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 CT abdomen done today shows no bleeding. I did touch base with Dr. Allen Norris who does feel that her pancreatitis Is better. He does not feel there is any absolute contraindication to steroids and especially given her severe thrombocytopenia benefits outweigh risks. I will therefore proceed with prednisone 1 mg/kg 80 mg today along with protonix 40 mg daily.    2. Normocytic anemia- hb dropped down from 12 to 7 gradually over 3 days. Labs suggestive of b12 and folate deficiency. She is on b12 injections and po folate. Anemia may be due to acute bone marrow suppression as well. But getting bone marrow biopsy tomorrow  Will continue to follow.    Visit Diagnosis 1. Acute pancreatitis, unspecified complication status, unspecified pancreatitis type   2. Pancytopenia ([place])   3. Severe thrombocytopenia   Dr. Randa Evens, MD, MPH [facility] [phone] 07/09/2019 3:24 PM

## 2019-07-09 NOTE — Consult Note (Addendum)
Pharmacy Consult Note:   Pharmacy consult: Investigate thrombocytopenia as it relates to home medications:   Assessment:  Pharmacist review PTA medications and found the following information:  -clonidine listed as 1%-10% -gabapentin- post marketing event (no rates listed) -sertraline -  immune thrombocytopenia; post marketing event (no rates listed)  Plan:  1. Will notify MD of aforementioned medications and follow up on oncology consult for today.  Kristeen Miss, PharmD Clinical Pharmacist

## 2019-07-10 ENCOUNTER — Other Ambulatory Visit (HOSPITAL_COMMUNITY)
Admission: RE | Admit: 2019-07-10 | Disposition: A | Discharge status: Home / Self Care | Payer: Medicaid Other | Source: Ambulatory Visit | Attending: Oncology | Admitting: Oncology

## 2019-07-10 ENCOUNTER — Inpatient Hospital Stay: Payer: Medicaid Other

## 2019-07-10 LAB — TYPE AND SCREEN
ABO/RH(D): O POS
Antibody Screen: NEGATIVE
Unit division: 0
Unit division: 0

## 2019-07-10 LAB — BPAM RBC
Blood Product Expiration Date: 202009292359
Blood Product Expiration Date: 202010112359
ISSUE DATE / TIME: 202009091510
ISSUE DATE / TIME: 202009091604
Unit Type and Rh: 5100
Unit Type and Rh: 5100

## 2019-07-10 LAB — BLOOD GAS, ARTERIAL
Acid-base deficit: 11.4 mmol/L — ABNORMAL HIGH (ref 0.0–2.0)
Bicarbonate: 14.2 mmol/L — ABNORMAL LOW (ref 20.0–28.0)
FIO2: 0.21
O2 Saturation: 96.9 %
Patient temperature: 37
pCO2 arterial: 31 mmHg — ABNORMAL LOW (ref 32.0–48.0)
pH, Arterial: 7.27 — ABNORMAL LOW (ref 7.350–7.450)
pO2, Arterial: 101 mmHg (ref 83.0–108.0)

## 2019-07-10 LAB — CBC WITH DIFFERENTIAL/PLATELET
Abs Immature Granulocytes: 0.05 10*3/uL (ref 0.00–0.07)
Basophils Absolute: 0 10*3/uL (ref 0.0–0.1)
Basophils Relative: 0 %
Eosinophils Absolute: 0 10*3/uL (ref 0.0–0.5)
Eosinophils Relative: 0 %
HCT: 24.4 % — ABNORMAL LOW (ref 36.0–46.0)
Hemoglobin: 8.4 g/dL — ABNORMAL LOW (ref 12.0–15.0)
Immature Granulocytes: 2 %
Lymphocytes Relative: 37 %
Lymphs Abs: 1.1 10*3/uL (ref 0.7–4.0)
MCH: 31.7 pg (ref 26.0–34.0)
MCHC: 34.4 g/dL (ref 30.0–36.0)
MCV: 92.1 fL (ref 80.0–100.0)
Monocytes Absolute: 0.2 10*3/uL (ref 0.1–1.0)
Monocytes Relative: 5 %
Neutro Abs: 1.6 10*3/uL — ABNORMAL LOW (ref 1.7–7.7)
Neutrophils Relative %: 56 %
Platelets: 17 10*3/uL — CL (ref 150–400)
RBC: 2.65 MIL/uL — ABNORMAL LOW (ref 3.87–5.11)
RDW: 16.7 % — ABNORMAL HIGH (ref 11.5–15.5)
WBC: 2.9 10*3/uL — ABNORMAL LOW (ref 4.0–10.5)
nRBC: 3.1 % — ABNORMAL HIGH (ref 0.0–0.2)

## 2019-07-10 LAB — PREPARE PLATELET PHERESIS: Unit division: 0

## 2019-07-10 LAB — BASIC METABOLIC PANEL
Anion gap: 8 (ref 5–15)
BUN: 6 mg/dL (ref 6–20)
CO2: 20 mmol/L — ABNORMAL LOW (ref 22–32)
Calcium: 8.6 mg/dL — ABNORMAL LOW (ref 8.9–10.3)
Chloride: 110 mmol/L (ref 98–111)
Creatinine, Ser: 0.44 mg/dL (ref 0.44–1.00)
GFR calc Af Amer: >60 mL/min (ref >60)
GFR calc non Af Amer: >60 mL/min (ref >60)
Glucose, Bld: 113 mg/dL — ABNORMAL HIGH (ref 70–99)
Potassium: 4.7 mmol/L (ref 3.5–5.1)
Sodium: 138 mmol/L (ref 135–145)

## 2019-07-10 LAB — BPAM PLATELET PHERESIS
Blood Product Expiration Date: 202009102359
ISSUE DATE / TIME: 202009091320
Unit Type and Rh: 7300

## 2019-07-10 LAB — LIPASE, BLOOD: Lipase: 53 U/L — ABNORMAL HIGH (ref 11–51)

## 2019-07-10 LAB — PHOSPHORUS: Phosphorus: 4.1 mg/dL (ref 2.5–4.6)

## 2019-07-10 LAB — PROTIME-INR
INR: 1 (ref 0.8–1.2)
Prothrombin Time: 13.3 seconds (ref 11.4–15.2)

## 2019-07-10 LAB — MAGNESIUM: Magnesium: 2 mg/dL (ref 1.7–2.4)

## 2019-07-10 MED ORDER — LORAZEPAM 2 MG/ML IJ SOLN
1.0000 mg | Freq: Once | INTRAMUSCULAR | Status: AC
  Administered 2019-07-11: 14:00:00 1 mg via INTRAVENOUS
  Filled 2019-07-10 (×2): qty 1

## 2019-07-10 MED ORDER — MIDAZOLAM HCL 2 MG/2ML IJ SOLN
INTRAMUSCULAR | Status: AC | PRN
  Administered 2019-07-10: 1 mg via INTRAVENOUS

## 2019-07-10 MED ORDER — FENTANYL CITRATE (PF) 100 MCG/2ML IJ SOLN
INTRAMUSCULAR | Status: AC
  Administered 2019-07-10: 12:00:00
  Filled 2019-07-10: qty 4

## 2019-07-10 MED ORDER — METOPROLOL TARTRATE 50 MG PO TABS
50.0000 mg | ORAL_TABLET | Freq: Two times a day (BID) | ORAL | Status: DC
  Administered 2019-07-10 – 2019-07-13 (×7): 50 mg via ORAL
  Filled 2019-07-10 (×8): qty 1

## 2019-07-10 MED ORDER — MIDAZOLAM HCL 5 MG/5ML IJ SOLN
INTRAMUSCULAR | Status: AC | PRN
  Administered 2019-07-10 (×4): 1 mg via INTRAVENOUS

## 2019-07-10 MED ORDER — MIDAZOLAM HCL 5 MG/5ML IJ SOLN
INTRAMUSCULAR | Status: AC
  Administered 2019-07-10: 12:00:00
  Filled 2019-07-10: qty 10

## 2019-07-10 MED ORDER — HEPARIN SOD (PORK) LOCK FLUSH 100 UNIT/ML IV SOLN
INTRAVENOUS | Status: AC
  Administered 2019-07-10: 12:00:00
  Filled 2019-07-10: qty 5

## 2019-07-10 MED ORDER — FENTANYL CITRATE (PF) 100 MCG/2ML IJ SOLN
INTRAMUSCULAR | Status: AC | PRN
  Administered 2019-07-10 (×7): 25 ug via INTRAVENOUS

## 2019-07-10 MED ORDER — POTASSIUM CHLORIDE IN NACL 20-0.9 MEQ/L-% IV SOLN
INTRAVENOUS | Status: DC
  Administered 2019-07-10 – 2019-07-11 (×3): via INTRAVENOUS
  Filled 2019-07-10 (×12): qty 1000

## 2019-07-10 NOTE — Progress Notes (Signed)
Pt. continues to be impulsive, anxious, and wanting to leave periodically. Order received for 1:1 safety sitter but unable accommodate at this time.  Tele sitter initiated as a Designer, fashion/clothing. See CIWA. Will continue to monitor.

## 2019-07-10 NOTE — Progress Notes (Signed)
[facility] NAME: Sarah Serrano    MR#:  [id number]  DATE OF BIRTH:  [birth year]  SUBJECTIVE:  CHIEF COMPLAINT: Patient is made n.p.o. for bone marrow biopsy, asking pain medicines,  complaining of lower abdominal pain radiating to her back no other episodes of seizures noticed  patient is transferred to ICU on 07/07/2019 Back to the floor on 07/08/2019 According to psychiatry patient is medically incompetent to make decisions Her boyfriend Legrand Como healthcare power of attorney is agreeable with the procedure  REVIEW OF SYSTEMS:   cONSTITUTIONAL: Reports weakness.  EYES: No blurred or double vision.  RESPIRATORY: No cough, shortness of breath, or hemoptysis.  CARDIOVASCULAR: No chest pain, orthopnea, edema.  GASTROINTESTINAL: No nausea, vomiting, diarrhea.  Reporting lower abdominal pain and back pain HEMATOLOGY: No anemia, easy bruising or bleeding SKIN: No rash or lesion.  No purpura NEUROLOGIC: No tingling, numbness, weakness.  Psychologic-anxious  DRUG ALLERGIES:  No Known Allergies  VITALS:  Blood pressure (!) 151/86, pulse 96, temperature 97.7 F (36.5 C), temperature source Oral, resp. rate 16, height 5' 4"  (1.626 m), weight 74.2 kg, last menstrual period 07/05/2019, SpO2 100 %.  PHYSICAL EXAMINATION:  GENERAL:  43 y.o.-year-old patient lying in the bed with no acute distress.  EYES: Pupils equal, round, reactive to light and accommodation. No scleral icterus. Extraocular muscles intact.  HEENT: Head atraumatic, normocephalic. Oropharynx and nasopharynx clear.  NECK:  Supple, no jugular venous distention. No thyroid enlargement, no tenderness.  LUNGS: Normal breath sounds bilaterally, no wheezing, rales,rhonchi or crepitation. No use of accessory muscles of respiration.  CARDIOVASCULAR: S1, S2 normal. No murmurs, rubs, or gallops.  ABDOMEN: Soft, lower abdominal tenderness no rebound tenderness nondistended. Bowel  sounds present.  EXTREMITIES: No pedal edema, cyanosis, or clubbing.  NEUROLOGIC: Awake and alert in between the seizure episodes and answering few questions gait not checked for safety PSYCHIATRIC: The patient is alert and talking in between the seizure episodes.  SKIN: No obvious rash, lesion, or ulcer.  No bruises noticed   LABORATORY PANEL:   CBC Recent Labs  Lab 07/10/19 0405  WBC 2.9*  HGB 8.4*  HCT 24.4*  PLT 17*   ------------------------------------------------------------------------------------------------------------------  Chemistries  Recent Labs  Lab 07/09/19 0420 07/10/19 0405  NA 137 138  K 3.6 4.7  CL 107 110  CO2 24 20*  GLUCOSE 101* 113*  BUN 7 6  CREATININE 0.51 0.44  CALCIUM 7.9* 8.6*  MG 2.0 2.0  AST 26  --   ALT 11  --   ALKPHOS 47  --   BILITOT 1.0  --    ------------------------------------------------------------------------------------------------------------------  Cardiac Enzymes No results for input(s): TROPONINI in the last 168 hours. ------------------------------------------------------------------------------------------------------------------  RADIOLOGY:  Ct Abdomen Pelvis W Contrast  Result Date: 07/09/2019 CLINICAL DATA:  Acute pancreatitis.  Low platelets. EXAM: CT ABDOMEN AND PELVIS WITH CONTRAST TECHNIQUE: Multidetector CT imaging of the abdomen and pelvis was performed using the standard protocol following bolus administration of intravenous contrast. CONTRAST:  164m OMNIPAQUE IOHEXOL 300 MG/ML  SOLN COMPARISON:  CT head July 06, 2019, July 16, 2014 FINDINGS: Lower chest: Mild LEFT basilar atelectasis. Hepatobiliary: Within the lateral segment of LEFT hepatic lobe 2.5 x 2.1 cm lesion is hyperdense and equal intensity to the portal venous phase vascularity. Lesion is unchanged over multiple comparison exams back to 2015. There is overall low-attenuation in liver consistent with hepatic steatosis. The gallbladder and  common bile duct are normal. No biliary duct  dilatation Pancreas: Minimal inflammation along the body and tail the pancreas which is slightly improved in the 3 day interval. No pancreatic duct dilatation. No organized fluid collections. Spleen: Normal spleen Adrenals/urinary tract: Adrenal glands and kidneys are normal. The ureters and bladder normal. Stomach/Bowel: Stomach, small bowel, appendix, and cecum are normal. The colon and rectosigmoid colon are normal. Vascular/Lymphatic: Abdominal aorta is normal caliber. No periportal or retroperitoneal adenopathy. No pelvic adenopathy. Reproductive: Low-attenuation rounded lesion in the RIGHT lateral wall of the uterus measures 6 cm most consistent leiomyoma. This increased in size from 4.7 cm on CT 07/16/2014. Tampon in the vagina. Cystic lesion posterior LEFT of the uterus measuring 4.6 x 2.4 cm (image 66/2) is presumably ovarian cyst. The cystic lesion has simple fluid attenuation and not changed significantly from CT exam 2015. RIGHT ovary normal. Other: No free fluid. Musculoskeletal: No aggressive osseous lesion. IMPRESSION: 1. Improvement in mild pancreatitis compared to CT 3 days prior. No organized fluid collections. 2. Hepatic steatosis. 3. Enhancing lesion in the LEFT hepatic lobe stable of over comparison exams back to 2015. Favor benign vascular lesion. 4. Uterine leiomyoma increased in size from 2015. 5. Cystic lesion in the LEFT adnexa stable from 2015 Electronically Signed   By: Suzy Bouchard M.D.   On: 07/09/2019 11:37    EKG:   Orders placed or performed during the hospital encounter of 07/06/19  . EKG 12-Lead  . EKG 12-Lead  . ED EKG  . ED EKG    ASSESSMENT AND PLAN:  #Severe anemia, thrombocytopenia with platelet count [id number] From bone marrow suppression probably No schistocytes Patient has received 1 unit of platelets on 07/07/2019 , repeat transfusion 07/08/2019 ,on 07/09/2019  appreciate oncology recommendations  LDH 325   Discontinued clonidine and Zoloft, as recommended by pharmacy which could cause thrombocytopenia Oncology has discussed with the interventional radiology for bone marrow biopsy which is scheduled for today.  Patient is n.p.o..  Consent obtained from her boyfriend Legrand Como Patient is started on steroids, GI is agreeable with the steroids as benefits outweigh the risks and pancreatitis is clinically improving Platelet count is at 17,000 Hemoglobin 8.4 metabolic 59.4   #Acute pancreatitis alcohol induced Clinically improving N.p.o., for biopsy followed by clear liquid diet as she is clinically improving  IV fluids Pain medications as needed   supportive treatment CT abdomen has revealed acute pancreatitis with no abscess Repeat CT abdomen on 07/09/2019 revealed improving acute pancreatitis compared to prior study of CT scan.  Enhancing lesion in the left hepatic lobe favoring benign vascular lesion and uterine leiomyoma increased in size from May 2015 GI Dr. Allen Norris following, appreciate his recommendations lipase at 140-89  #Symptomatic anemia no active bleeding GI is following Stool for occult blood-negative Hemoglobin 12.6-10.3-8.5-7.1 given 1 unit of blood transfusion hemoglobin at 8.4  #Recurrent breakthrough seizures, from alcohol withdrawal IV Ativan as needed 1 g IV Keppra given and neurology is recommending to continue Keppra 500 every 12 hours Stat CT head is negative Dr. Doy Mince is following EEG ordered but patient has refused EEG and further work-up  #Hypomagnesemia and hypokalemia replete and recheck IV magnesium ordered, repeat magnesium is at 2.0  #Essential hypertension Blood pressure is soft Nitropaste to the anterior chest wall will be discontinued IV labetalol as needed CT head is negative Clonidine discontinued in view of thrombocytopenia, pharmacy is concerned it might cause thrombocytopenia.  Discontinued clonidine and patient restarted on metoprolol dose  increased to 50 mg p.o. twice daily will titrate as needed  #  Hepatic lobe lesion seems to be benign PCP to follow-up and refer to hepatologist if needed  #Uterine leiomyoma outpatient follow-up with GYN      All the records are reviewed and case discussed with Care Management/Social Workerr. Management plans discussed with the patient, family and they are in agreement.  CODE STATUS: fc   TOTAL CRITICAL CARE TIME TAKING CARE OF THIS PATIENT: 35 minutes.   POSSIBLE D/C IN  2 DAYS, DEPENDING ON CLINICAL CONDITION.  Note: This dictation was prepared with Dragon dictation along with smaller phrase technology. Any transcriptional errors that result from this process are unintentional.   Nicholes Mango M.D on 07/10/2019 at 10:02 AM  Between 7am to 6pm - Pager - [phone] After 6pm go to [email or web address] - password [email or web address] [facility]  Office  [phone]  CC: Primary care physician; Patient, No Pcp Per

## 2019-07-10 NOTE — Procedures (Signed)
Pre-procedure Diagnosis: Thrombocytopenia Post-procedure Diagnosis: Same  Technically successful CT guided bone marrow aspiration and biopsy of left iliac crest.   Complications: None Immediate  EBL: None  Signed: Sandi Mariscal Pager: [phone] 07/10/2019, 10:58 AM

## 2019-07-10 NOTE — Consult Note (Signed)
PHARMACY CONSULT NOTE - FOLLOW UP  Pharmacy Consult for Electrolyte Monitoring and Replacement   Recent Labs: Potassium (mmol/L)  Date Value  07/10/2019 4.7  07/16/2014 3.6   Magnesium (mg/dL)  Date Value  07/10/2019 2.0  11/22/2013 1.5 (L)   Calcium (mg/dL)  Date Value  07/10/2019 8.6 (L)   Calcium, Total (mg/dL)  Date Value  07/16/2014 8.9   Albumin (g/dL)  Date Value  07/09/2019 2.7 (L)  07/16/2014 3.4   Phosphorus (mg/dL)  Date Value  07/10/2019 4.1  02/03/2013 1.7 (L)   Sodium (mmol/L)  Date Value  07/10/2019 138  07/16/2014 145  Corrected Ca: 8.94  Assessment: 43 year old female with electrolyte abnormality. Pharmacy consulted to manage.  Goal of Therapy:  Electrolytes wnl's  Plan:  9/10 K 4.7  Mag 2.0  Phos 4.1  Scr 0.44 Currently on NS w/ KCL 40 meq/L (rate of 125 ml/hr). Potassium has increased from 3.6 to 4.7.   -Will change IVF to NS with 20 meq KCL/L at 125 ml/hr  Patient is currently NPO .  Electrolytes with am labs.  Noralee Space ,PharmD Clinical Pharmacist 07/10/2019 7:26 AM

## 2019-07-10 NOTE — Progress Notes (Signed)
Hematology/Oncology Consult note [facility]  [phone] [phone]  Patient Care Team: Patient, No Pcp Per as PCP - General (General Practice)   Name of the patient: Sarah Serrano  [id number]  07/28/1976   Date of visit: 07/10/2019   Interval history- she is confused but was oriented to self place and person. Reports abdominal pain is better    Review of systems- Review of Systems  Constitutional: Positive for malaise/fatigue. Negative for chills, fever and weight loss.  HENT: Negative for congestion, ear discharge and nosebleeds.   Eyes: Negative for blurred vision.  Respiratory: Negative for cough, hemoptysis, sputum production, shortness of breath and wheezing.   Cardiovascular: Negative for chest pain, palpitations, orthopnea and claudication.  Gastrointestinal: Negative for blood in stool, constipation, diarrhea, heartburn, melena, nausea and vomiting.  Genitourinary: Negative for dysuria, flank pain, frequency, hematuria and urgency.  Musculoskeletal: Negative for back pain, joint pain and myalgias.  Skin: Negative for rash.  Neurological: Negative for dizziness, tingling, focal weakness, seizures, weakness and headaches.  Endo/Heme/Allergies: Does not bruise/bleed easily.  Psychiatric/Behavioral: Negative for depression and suicidal ideas. The patient does not have insomnia.        No Known Allergies   Past Medical History:  Diagnosis Date   Alcohol abuse    Bipolar 1 disorder (HCC)    Substance abuse ([place])      Past Surgical History:  Procedure Laterality Date   NO PAST SURGERIES      Social History   Socioeconomic History   Marital status: Single    Spouse name: Not on file   Number of children: Not on file   Years of education: Not on file   Highest education level: Not on file  Occupational History   Not on file  Social Needs   Financial resource strain: Not on file   Food insecurity      Worry: Not on file    Inability: Not on file   Transportation needs    Medical: Not on file    Non-medical: Not on file  Tobacco Use   Smoking status: Current Every Day Smoker    Packs/day: 0.50    Types: Cigarettes   Smokeless tobacco: Never Used  Substance and Sexual Activity   Alcohol use: Yes   Drug use: Yes   Sexual activity: Not on file  Lifestyle   Physical activity    Days per week: Not on file    Minutes per session: Not on file   Stress: Not on file  Relationships   Social connections    Talks on phone: Not on file    Gets together: Not on file    Attends religious service: Not on file    Active member of club or organization: Not on file    Attends meetings of clubs or organizations: Not on file    Relationship status: Not on file   Intimate partner violence    Fear of current or ex partner: Not on file    Emotionally abused: Not on file    Physically abused: Not on file    Forced sexual activity: Not on file  Other Topics Concern   Not on file  Social History Narrative   Not on file    Family History  Problem Relation Age of Onset   Bipolar disorder Mother      Current Facility-Administered Medications:    0.9 %  sodium chloride infusion, , Intravenous, PRN, Nicholes Mango, MD, Stopped  at 07/09/19 1311   0.9 %  sodium chloride infusion, , Intravenous, Continuous, Corrie Mckusick, DO, Stopped at 07/10/19 1123   0.9 % NaCl with KCl 20 mEq/ L  infusion, , Intravenous, Continuous, Gouru, Aruna, MD, Last Rate: 125 mL/hr at 07/10/19 1123   acetaminophen (TYLENOL) tablet 650 mg, 650 mg, Oral, Q6H PRN, 650 mg at 07/09/19 0525 **OR** acetaminophen (TYLENOL) suppository 650 mg, 650 mg, Rectal, Q6H PRN, Harrie Foreman, MD, 650 mg at 07/07/19 0506   chlorpheniramine-HYDROcodone (TUSSIONEX) 10-8 MG/5ML suspension 5 mL, 5 mL, Oral, QHS PRN, Harrie Foreman, MD   cyanocobalamin ((VITAMIN B-12)) injection 1,000 mcg, 1,000 mcg, Intramuscular,  Daily, Gouru, Aruna, MD, 1,000 mcg at 07/10/19 1129   cyclobenzaprine (FLEXERIL) tablet 10 mg, 10 mg, Oral, BID PRN, Gouru, Aruna, MD, 10 mg at 47/42/59 5638   folic acid (FOLVITE) tablet 1 mg, 1 mg, Oral, Daily, Harrie Foreman, MD, 1 mg at 07/10/19 1130   gabapentin (NEURONTIN) capsule 300 mg, 300 mg, Oral, BID PRN, Gouru, Aruna, MD, 300 mg at 07/09/19 2248   HYDROmorphone (DILAUDID) injection 0.5 mg, 0.5 mg, Intravenous, Q3H PRN, Harrie Foreman, MD, 0.5 mg at 07/10/19 0125   iohexol (OMNIPAQUE) 9 MG/ML oral solution 500 mL, 500 mL, Oral, BID PRN, Harrie Foreman, MD, 500 mL at 07/06/19 1745   iohexol (OMNIPAQUE) 9 MG/ML oral solution 500 mL, 500 mL, Oral, Once PRN, Sindy Guadeloupe, MD, 500 mL at 07/09/19 0859   levETIRAcetam (KEPPRA) IVPB 500 mg/100 mL premix, 500 mg, Intravenous, Q12H, Alexis Goodell, MD, Stopped at 07/10/19 1200   [EXPIRED] LORazepam (ATIVAN) injection 0-4 mg, 0-4 mg, Intravenous, Q4H, 2 mg at 07/09/19 2335 **FOLLOWED BY** LORazepam (ATIVAN) injection 0-4 mg, 0-4 mg, Intravenous, Q12H, Harrie Foreman, MD, 2 mg at 07/10/19 0342   LORazepam (ATIVAN) injection 1 mg, 1 mg, Intravenous, Q4H PRN, Gouru, Aruna, MD, 1 mg at 07/07/19 0932   LORazepam (ATIVAN) tablet 2 mg, 2 mg, Oral, Q6H PRN, 2 mg at 07/09/19 1807 **OR** LORazepam (ATIVAN) injection 2 mg, 2 mg, Intravenous, Q6H PRN, Harrie Foreman, MD, 2 mg at 07/10/19 1318   metoCLOPramide (REGLAN) injection 5 mg, 5 mg, Intravenous, Q6H PRN, Lance Coon, MD, 5 mg at 07/09/19 0526   metoprolol tartrate (LOPRESSOR) injection 5 mg, 5 mg, Intravenous, Q4H PRN, Gouru, Aruna, MD, 5 mg at 07/09/19 0554   metoprolol tartrate (LOPRESSOR) tablet 50 mg, 50 mg, Oral, BID, Gouru, Aruna, MD, 50 mg at 07/10/19 1132   morphine 2 MG/ML injection 2 mg, 2 mg, Intravenous, Q4H PRN, Harrie Foreman, MD, 2 mg at 07/09/19 1509   multivitamin with minerals tablet 1 tablet, 1 tablet, Oral, Daily, Harrie Foreman, MD, 1  tablet at 07/10/19 1129   naloxone (NARCAN) nasal spray 4 mg/0.1 mL, 1 spray, Nasal, Once, Gouru, Aruna, MD, Stopped at 07/07/19 0830   nitroGLYCERIN (NITROGLYN) 2 % ointment 0.5 inch, 0.5 inch, Topical, Q6H, Harrie Foreman, MD, 0.5 inch at 07/10/19 0035   nitroGLYCERIN (NITROSTAT) SL tablet 0.4 mg, 0.4 mg, Sublingual, Q5 min PRN, Gouru, Aruna, MD, 0.4 mg at 07/07/19 0815   ondansetron (ZOFRAN) tablet 4 mg, 4 mg, Oral, Q6H PRN **OR** ondansetron (ZOFRAN) injection 4 mg, 4 mg, Intravenous, Q6H PRN, Harrie Foreman, MD, 4 mg at 07/08/19 0204   pantoprazole (PROTONIX) EC tablet 20 mg, 20 mg, Oral, Daily, Sindy Guadeloupe, MD, 20 mg at 07/10/19 1130   predniSONE (DELTASONE) tablet 80 mg, 80 mg, Oral, Q breakfast,  Sindy Guadeloupe, MD, 80 mg at 07/10/19 1129   prochlorperazine (COMPAZINE) injection 10 mg, 10 mg, Intravenous, Q6H PRN, Harrie Foreman, MD, 10 mg at 07/09/19 0202   QUEtiapine (SEROQUEL) tablet 300 mg, 300 mg, Oral, BID, Money, Darnelle Maffucci B, FNP, 300 mg at 07/10/19 1130   sodium chloride flush (NS) 0.9 % injection 10-40 mL, 10-40 mL, Intracatheter, Q12H, Gouru, Aruna, MD, 10 mL at 07/10/19 1133   sodium chloride flush (NS) 0.9 % injection 10-40 mL, 10-40 mL, Intracatheter, PRN, Gouru, Aruna, MD   thiamine (VITAMIN B-1) tablet 100 mg, 100 mg, Oral, Daily, 100 mg at 07/10/19 1126 **OR** thiamine (B-1) injection 100 mg, 100 mg, Intravenous, Daily, Harrie Foreman, MD, 100 mg at 07/08/19 0914  Physical exam:  Vitals:   07/10/19 1036 07/10/19 1040 07/10/19 1043 07/10/19 1132  BP: (!) 173/101  (!) 181/117 (!) 133/96  Pulse: 95 97  97  Resp: 15 17    Temp:      TempSrc:      SpO2: 100% 100%    Weight:      Height:       Physical Exam Constitutional:      Comments: Intermittently confused  HENT:     Head: Normocephalic and atraumatic.  Eyes:     Pupils: Pupils are equal, round, and reactive to light.  Neck:     Musculoskeletal: Normal range of motion.    Cardiovascular:     Rate and Rhythm: Normal rate and regular rhythm.     Heart sounds: Normal heart sounds.  Pulmonary:     Effort: Pulmonary effort is normal.     Breath sounds: Normal breath sounds.  Abdominal:     General: Bowel sounds are normal.     Palpations: Abdomen is soft.  Skin:    General: Skin is warm and dry.  Neurological:     Mental Status: She is alert and oriented to person, place, and time.      CMP Latest Ref Rng & Units 07/10/2019  Glucose 70 - 99 mg/dL 113(H)  BUN 6 - 20 mg/dL 6  Creatinine 0.44 - 1.00 mg/dL 0.44  Sodium 135 - 145 mmol/L 138  Potassium 3.5 - 5.1 mmol/L 4.7  Chloride 98 - 111 mmol/L 110  CO2 22 - 32 mmol/L 20(L)  Calcium 8.9 - 10.3 mg/dL 8.6(L)  Total Protein 6.5 - 8.1 g/dL -  Total Bilirubin 0.3 - 1.2 mg/dL -  Alkaline Phos 38 - 126 U/L -  AST 15 - 41 U/L -  ALT 0 - 44 U/L -   CBC Latest Ref Rng & Units 07/10/2019  WBC 4.0 - 10.5 K/uL 2.9(L)  Hemoglobin 12.0 - 15.0 g/dL 8.4(L)  Hematocrit 36.0 - 46.0 % 24.4(L)  Platelets 150 - 400 K/uL 17(LL)    @IMAGES @  Ct Head Wo Contrast  Result Date: 07/07/2019 CLINICAL DATA:  Patient found down this morning after a fall. Initial encounter. EXAM: CT HEAD WITHOUT CONTRAST TECHNIQUE: Contiguous axial images were obtained from the base of the skull through the vertex without intravenous contrast. COMPARISON:  Head CT scan 07/16/2014. FINDINGS: Brain: No evidence of acute infarction, hemorrhage, hydrocephalus, extra-axial collection or mass lesion/mass effect. A few calcifications in the basal ganglia bilaterally are unchanged. Vascular: No hyperdense vessel or unexpected calcification. Skull: Intact.  No focal lesion. Sinuses/Orbits: Negative. Other: None. IMPRESSION: Negative head CT. Electronically Signed   By: Inge Rise M.D.   On: 07/07/2019 09:08   Ct Abdomen Pelvis W Contrast  Result Date: 07/09/2019 CLINICAL DATA:  Acute pancreatitis.  Low platelets. EXAM: CT ABDOMEN AND PELVIS WITH  CONTRAST TECHNIQUE: Multidetector CT imaging of the abdomen and pelvis was performed using the standard protocol following bolus administration of intravenous contrast. CONTRAST:  148m OMNIPAQUE IOHEXOL 300 MG/ML  SOLN COMPARISON:  CT head July 06, 2019, July 16, 2014 FINDINGS: Lower chest: Mild LEFT basilar atelectasis. Hepatobiliary: Within the lateral segment of LEFT hepatic lobe 2.5 x 2.1 cm lesion is hyperdense and equal intensity to the portal venous phase vascularity. Lesion is unchanged over multiple comparison exams back to 2015. There is overall low-attenuation in liver consistent with hepatic steatosis. The gallbladder and common bile duct are normal. No biliary duct dilatation Pancreas: Minimal inflammation along the body and tail the pancreas which is slightly improved in the 3 day interval. No pancreatic duct dilatation. No organized fluid collections. Spleen: Normal spleen Adrenals/urinary tract: Adrenal glands and kidneys are normal. The ureters and bladder normal. Stomach/Bowel: Stomach, small bowel, appendix, and cecum are normal. The colon and rectosigmoid colon are normal. Vascular/Lymphatic: Abdominal aorta is normal caliber. No periportal or retroperitoneal adenopathy. No pelvic adenopathy. Reproductive: Low-attenuation rounded lesion in the RIGHT lateral wall of the uterus measures 6 cm most consistent leiomyoma. This increased in size from 4.7 cm on CT 07/16/2014. Tampon in the vagina. Cystic lesion posterior LEFT of the uterus measuring 4.6 x 2.4 cm (image 66/2) is presumably ovarian cyst. The cystic lesion has simple fluid attenuation and not changed significantly from CT exam 2015. RIGHT ovary normal. Other: No free fluid. Musculoskeletal: No aggressive osseous lesion. IMPRESSION: 1. Improvement in mild pancreatitis compared to CT 3 days prior. No organized fluid collections. 2. Hepatic steatosis. 3. Enhancing lesion in the LEFT hepatic lobe stable of over comparison exams back  to 2015. Favor benign vascular lesion. 4. Uterine leiomyoma increased in size from 2015. 5. Cystic lesion in the LEFT adnexa stable from 2015 Electronically Signed   By: SSuzy BouchardM.D.   On: 07/09/2019 11:37   Ct Abdomen Pelvis W Contrast  Result Date: 07/06/2019 CLINICAL DATA:  Acute, generalized abdominal pain and vomiting. Shortness of breath. EXAM: CT ABDOMEN AND PELVIS WITH CONTRAST TECHNIQUE: Multidetector CT imaging of the abdomen and pelvis was performed using the standard protocol following bolus administration of intravenous contrast. CONTRAST:  1046mOMNIPAQUE IOHEXOL 300 MG/ML  SOLN COMPARISON:  07/16/2014. FINDINGS: Lower chest: Unremarkable. Hepatobiliary: Marked diffuse low density of the liver relative to the spleen. The liver is enlarged, most pronounced involving the lateral segment left lobe and caudate lobe. Mild diffuse gallbladder wall enhancement without thickening or pericholecystic fluid. Pancreas: Diffuse peripancreatic soft tissue stranding and edema. This tracks superiorly and anteriorly. No discrete fluid collections are seen. Spleen: Normal in size without focal abnormality. Adrenals/Urinary Tract: Adrenal glands are unremarkable. Kidneys are normal, without renal calculi, focal lesion, or hydronephrosis. Bladder is unremarkable. Stomach/Bowel: Mild low density distal gastric wall thickening. Normal appearing appendix, small bowel and colon. Vascular/Lymphatic: No significant vascular findings are present. No enlarged abdominal or pelvic lymph nodes. Reproductive: Enlarged, lobulated uterus containing coarse calcifications. Previously, with intravenous contrast, there were corresponding uterine masses. A simple appearing left ovarian cyst has not changed significantly, measuring 4.4 x 2.3 cm today on image number 62 series 2, previously 3.8 x 2.4 cm. Normal appearing right ovary containing a small follicular cyst. Other: Tiny umbilical hernia containing fat. Musculoskeletal:  Unremarkable bones. IMPRESSION: 1. Acute pancreatitis without abscess. 2. Mild low density distal gastric wall thickening, most  likely secondary to the acute pancreatitis. Concomitant acute gastritis is less likely. 3. Marked diffuse hepatic steatosis. 4. Hepatomegaly. 5. Stable simple appearing left ovarian cyst. Electronically Signed   By: Claudie Revering M.D.   On: 07/06/2019 21:26   Dg Chest Portable 1 View  Result Date: 07/06/2019 CLINICAL DATA:  PT to ER with c/o vomiting that started last night. Pt states had chest pressure that started prior to vomiting, and now she describes it as hurting. Pt actively vomiting in triage, diaphoretic. Pt states SHOB. EXAM: PORTABLE CHEST 1 VIEW COMPARISON:  Chest radiographs 11/22/2013, 02/16/2012 FINDINGS: Stable cardiomediastinal contours within normal limits. The lungs are clear. No pneumothorax or large pleural effusion. No acute finding in the visualized skeleton. IMPRESSION: No evidence of active disease. Electronically Signed   By: Audie Pinto M.D.   On: 07/06/2019 17:53   Ct Bone Marrow Biopsy & Aspiration  Result Date: 07/10/2019 INDICATION: History of polysubstance and bipolar disorder, now with thrombocytopenia of uncertain etiology. Please perform CT-guided bone marrow biopsy for tissue diagnostic purposes. EXAM: CT-GUIDED BONE MARROW BIOPSY AND ASPIRATION MEDICATIONS: None ANESTHESIA/SEDATION: Fentanyl 150 mcg IV; Versed 5 mg IV Sedation Time: 23 Minutes; The patient was continuously monitored during the procedure by the interventional radiology nurse under my direct supervision. COMPLICATIONS: None immediate. PROCEDURE: Informed consent was obtained via the use of a emergency consent given the patient has been deemed unable to make medical decisions by psychiatry consult. A time out was performed prior to the initiation of the procedure. The patient was positioned prone and non-contrast localization CT was performed of the pelvis to demonstrate the  iliac marrow spaces. The operative site was prepped and draped in the usual sterile fashion. Under sterile conditions and local anesthesia, a 22 gauge spinal needle was utilized for procedural planning. Next, an 11 gauge coaxial bone biopsy needle was advanced into the left iliac marrow space. Needle position was confirmed with CT imaging. Initially, bone marrow aspiration was performed. Next, a bone marrow biopsy was obtained with the 11 gauge outer bone marrow device. The 11 gauge coaxial bone biopsy needle was re-advanced into a slightly different location within the left iliac marrow space, positioning was confirmed and an additional bone marrow biopsy was obtained. The needle was removed intact. Hemostasis was obtained with compression and a dressing was placed. The patient tolerated the procedure well without immediate post procedural complication. IMPRESSION: Successful CT guided left iliac bone marrow aspiration and core biopsy. Note, despite the administration of a significant amount of conscious sedation medications, the patient overall tolerated the procedure poorly. Electronically Signed   By: Sandi Mariscal M.D.   On: 07/10/2019 11:36     Assessment and plan- Patient is a 43 y.o. female with history of acute alcohol intake admitted for abdominal pain possibly secondary to acute pancreatitis found to have severe thrombocytopenia  Severe thrombocytopenia: Probably secondary to acute bone marrow suppression versus a component of ITP.  Peripheral blood smear findings as well as coagulation studies are not indicated of TTP or DIC so far.  She did receive 80 mg of prednisone yesterday at 1 mg/kg and her platelet count did improve to 17 today.  She has not had any significant improvement in her platelet count prior to this with transfusions.  Also her IPF is high indicating either recovery phase in the bone marrow versus peripheral destruction.  Recommend continuing oral prednisone at this time and  monitor her platelet counts for the next 1 to 2 days.  If  there is a consistent increase in her platelet count  greater than 30, she can be discharged and I will follow-up with her as an outpatient.  Bone marrow biopsy was done today. Will f/u results with her as an outpatient   Visit Diagnosis 1. Acute pancreatitis, unspecified complication status, unspecified pancreatitis type   2. Pancytopenia ([place])      Dr. Randa Evens, MD, MPH [facility] [phone] 07/10/2019 4:09 PM

## 2019-07-10 NOTE — Progress Notes (Signed)
Family Meeting Note  Advance Directive:yes  Today a meeting took place with the Patient.   The following clinical team members were present during this meeting:MD  The following were discussed:Patient's diagnosis: Acute pancreatitis, thrombocytopenia, anemia, alcohol dependence, intermittent episodes of seizures seen examined by me and treatment plan of care discussed in detail with the patient.  She verbalized understanding of the plan.  She will be followed up by the neurologist and hemato-oncology   patient's progosis: Unable to determine and Goals for treatment: Full Code  Boy friend Sarah Serrano  is the healthcare power of attorney  Additional follow-up to be provided: Hospitalist, and oncology, neurology  Time spent during discussion:49min  Nicholes Mango, MD

## 2019-07-10 NOTE — Consult Note (Signed)
Chief Complaint: Thrombocytopenia  Referring Physician(s): Janese Banks  Patient Status: [facility] - In-pt  History of Present Illness: Sarah Serrano is a 43 y.o. female with past medical history significant for bipolar disorder, alcohol and polysubstance abuse who is admitted with severe thrombocytopenia.  Please perform CT-guided bone marrow biopsy for tissue diagnostic purposes.  The patient has been deemed unable to make medical decisions by the psychiatry consultation and as such, emergency consent was obtained from the providing hospitalist.  Past Medical History:  Diagnosis Date   Alcohol abuse    Bipolar 1 disorder ([place])    Substance abuse ([place])     Past Surgical History:  Procedure Laterality Date   NO PAST SURGERIES      Allergies: Patient has no known allergies.  Medications: Prior to Admission medications   Medication Sig Start Date End Date Taking? Authorizing Provider  acetaminophen (TYLENOL) 325 MG tablet Take 650 mg by mouth every 6 (six) hours as needed for mild pain or fever.   Yes [provider]  Buprenorphine HCl-Naloxone HCl 8-2 MG FILM Place 1 Film under the tongue 3 (three) times daily.    Yes [provider]  cloNIDine (CATAPRES) 0.1 MG tablet Take 0.1 mg by mouth 2 (two) times daily.   Yes [provider]  cyclobenzaprine (FLEXERIL) 10 MG tablet Take 10 mg by mouth 2 (two) times daily as needed for muscle spasms.   Yes [provider]  gabapentin (NEURONTIN) 300 MG capsule Take 300 mg by mouth 2 (two) times daily as needed (back pain).   Yes [provider]  hydrOXYzine (ATARAX/VISTARIL) 25 MG tablet Take 25 mg by mouth 3 (three) times daily as needed for anxiety.   Yes [provider]  naloxone (NARCAN) 4 MG/0.1ML LIQD nasal spray kit Place 1 spray into the nose as directed.   Yes [provider]  polyethylene glycol (MIRALAX / GLYCOLAX) 17 g packet Take 17 g by mouth daily.   Yes  [provider]  QUEtiapine (SEROQUEL) 300 MG tablet Take 300 mg by mouth 2 (two) times daily.   Yes [provider]  sertraline (ZOLOFT) 100 MG tablet Take 150 mg by mouth daily.   Yes [provider]  naproxen (NAPROSYN) 500 MG tablet Take 1 tablet (500 mg total) by mouth 2 (two) times daily with a meal. Patient not taking: Reported on 07/08/2019 11/15/18   Victorino Dike, FNP     Family History  Problem Relation Age of Onset   Bipolar disorder Mother     Social History   Socioeconomic History   Marital status: Single    Spouse name: Not on file   Number of children: Not on file   Years of education: Not on file   Highest education level: Not on file  Occupational History   Not on file  Social Needs   Financial resource strain: Not on file   Food insecurity    Worry: Not on file    Inability: Not on file   Transportation needs    Medical: Not on file    Non-medical: Not on file  Tobacco Use   Smoking status: Current Every Day Smoker    Packs/day: 0.50    Types: Cigarettes   Smokeless tobacco: Never Used  Substance and Sexual Activity   Alcohol use: Yes   Drug use: Yes   Sexual activity: Not on file  Lifestyle   Physical activity    Days per week: Not on file  Minutes per session: Not on file   Stress: Not on file  Relationships   Social connections    Talks on phone: Not on file    Gets together: Not on file    Attends religious service: Not on file    Active member of club or organization: Not on file    Attends meetings of clubs or organizations: Not on file    Relationship status: Not on file  Other Topics Concern   Not on file  Social History Narrative   Not on file    ECOG Status: 2 - Symptomatic, <50% confined to bed  Review of Systems: A 12 point ROS discussed and pertinent positives are indicated in the HPI above.  All other systems are negative.  Review of Systems  Vital Signs: BP (!) 181/117     Pulse 97    Temp 97.7 F (36.5 C) (Oral)    Resp 17    Ht _0  (1.626 m)    Wt 74.2 kg    LMP 07/05/2019 Comment: neg preg test   SpO2 100%    BMI 28.08 kg/m   Physical Exam  Imaging: Ct Head Wo Contrast  Result Date: 07/07/2019 CLINICAL DATA:  Patient found down this morning after a fall. Initial encounter. EXAM: CT HEAD WITHOUT CONTRAST TECHNIQUE: Contiguous axial images were obtained from the base of the skull through the vertex without intravenous contrast. COMPARISON:  Head CT scan 07/16/2014. FINDINGS: Brain: No evidence of acute infarction, hemorrhage, hydrocephalus, extra-axial collection or mass lesion/mass effect. A few calcifications in the basal ganglia bilaterally are unchanged. Vascular: No hyperdense vessel or unexpected calcification. Skull: Intact.  No focal lesion. Sinuses/Orbits: Negative. Other: None. IMPRESSION: Negative head CT. Electronically Signed   By: Inge Rise M.D.   On: 07/07/2019 09:08   Ct Abdomen Pelvis W Contrast  Result Date: 07/09/2019 CLINICAL DATA:  Acute pancreatitis.  Low platelets. EXAM: CT ABDOMEN AND PELVIS WITH CONTRAST TECHNIQUE: Multidetector CT imaging of the abdomen and pelvis was performed using the standard protocol following bolus administration of intravenous contrast. CONTRAST:  14m OMNIPAQUE IOHEXOL 300 MG/ML  SOLN COMPARISON:  CT head July 06, 2019, July 16, 2014 FINDINGS: Lower chest: Mild LEFT basilar atelectasis. Hepatobiliary: Within the lateral segment of LEFT hepatic lobe 2.5 x 2.1 cm lesion is hyperdense and equal intensity to the portal venous phase vascularity. Lesion is unchanged over multiple comparison exams back to 2015. There is overall low-attenuation in liver consistent with hepatic steatosis. The gallbladder and common bile duct are normal. No biliary duct dilatation Pancreas: Minimal inflammation along the body and tail the pancreas which is slightly improved in the 3 day interval. No pancreatic duct dilatation.  No organized fluid collections. Spleen: Normal spleen Adrenals/urinary tract: Adrenal glands and kidneys are normal. The ureters and bladder normal. Stomach/Bowel: Stomach, small bowel, appendix, and cecum are normal. The colon and rectosigmoid colon are normal. Vascular/Lymphatic: Abdominal aorta is normal caliber. No periportal or retroperitoneal adenopathy. No pelvic adenopathy. Reproductive: Low-attenuation rounded lesion in the RIGHT lateral wall of the uterus measures 6 cm most consistent leiomyoma. This increased in size from 4.7 cm on CT 07/16/2014. Tampon in the vagina. Cystic lesion posterior LEFT of the uterus measuring 4.6 x 2.4 cm (image 66/2) is presumably ovarian cyst. The cystic lesion has simple fluid attenuation and not changed significantly from CT exam 2015. RIGHT ovary normal. Other: No free fluid. Musculoskeletal: No aggressive osseous lesion. IMPRESSION: 1. Improvement in mild pancreatitis compared to CT 3  days prior. No organized fluid collections. 2. Hepatic steatosis. 3. Enhancing lesion in the LEFT hepatic lobe stable of over comparison exams back to 2015. Favor benign vascular lesion. 4. Uterine leiomyoma increased in size from 2015. 5. Cystic lesion in the LEFT adnexa stable from 2015 Electronically Signed   By: Suzy Bouchard M.D.   On: 07/09/2019 11:37   Ct Abdomen Pelvis W Contrast  Result Date: 07/06/2019 CLINICAL DATA:  Acute, generalized abdominal pain and vomiting. Shortness of breath. EXAM: CT ABDOMEN AND PELVIS WITH CONTRAST TECHNIQUE: Multidetector CT imaging of the abdomen and pelvis was performed using the standard protocol following bolus administration of intravenous contrast. CONTRAST:  174m OMNIPAQUE IOHEXOL 300 MG/ML  SOLN COMPARISON:  07/16/2014. FINDINGS: Lower chest: Unremarkable. Hepatobiliary: Marked diffuse low density of the liver relative to the spleen. The liver is enlarged, most pronounced involving the lateral segment left lobe and caudate lobe. Mild  diffuse gallbladder wall enhancement without thickening or pericholecystic fluid. Pancreas: Diffuse peripancreatic soft tissue stranding and edema. This tracks superiorly and anteriorly. No discrete fluid collections are seen. Spleen: Normal in size without focal abnormality. Adrenals/Urinary Tract: Adrenal glands are unremarkable. Kidneys are normal, without renal calculi, focal lesion, or hydronephrosis. Bladder is unremarkable. Stomach/Bowel: Mild low density distal gastric wall thickening. Normal appearing appendix, small bowel and colon. Vascular/Lymphatic: No significant vascular findings are present. No enlarged abdominal or pelvic lymph nodes. Reproductive: Enlarged, lobulated uterus containing coarse calcifications. Previously, with intravenous contrast, there were corresponding uterine masses. A simple appearing left ovarian cyst has not changed significantly, measuring 4.4 x 2.3 cm today on image number 62 series 2, previously 3.8 x 2.4 cm. Normal appearing right ovary containing a small follicular cyst. Other: Tiny umbilical hernia containing fat. Musculoskeletal: Unremarkable bones. IMPRESSION: 1. Acute pancreatitis without abscess. 2. Mild low density distal gastric wall thickening, most likely secondary to the acute pancreatitis. Concomitant acute gastritis is less likely. 3. Marked diffuse hepatic steatosis. 4. Hepatomegaly. 5. Stable simple appearing left ovarian cyst. Electronically Signed   By: SClaudie ReveringM.D.   On: 07/06/2019 21:26   Dg Chest Portable 1 View  Result Date: 07/06/2019 CLINICAL DATA:  PT to ER with c/o vomiting that started last night. Pt states had chest pressure that started prior to vomiting, and now she describes it as hurting. Pt actively vomiting in triage, diaphoretic. Pt states SHOB. EXAM: PORTABLE CHEST 1 VIEW COMPARISON:  Chest radiographs 11/22/2013, 02/16/2012 FINDINGS: Stable cardiomediastinal contours within normal limits. The lungs are clear. No pneumothorax or  large pleural effusion. No acute finding in the visualized skeleton. IMPRESSION: No evidence of active disease. Electronically Signed   By: NAudie PintoM.D.   On: 07/06/2019 17:53    Labs:  CBC: Recent Labs    07/08/19 0652 07/08/19 1900 07/09/19 0420 07/09/19 2147 07/10/19 0405  WBC 5.3  --  2.9* 2.9* 2.9*  HGB 8.5*  --  7.1* 8.3* 8.4*  HCT 24.8*  --  20.5* 23.6* 24.4*  PLT 6* 7* 8* 15* 17*    COAGS: Recent Labs    07/07/19 0855 07/08/19 0652 07/10/19 0405  INR 1.0 1.0 1.0  APTT  --  29  --     BMP: Recent Labs    07/06/19 1817 07/08/19 0652 07/08/19 1855 07/09/19 0420 07/10/19 0405  NA 136 135  --  137 138  K 3.5 2.6* 2.9* 3.6 4.7  CL 94* 95*  --  107 110  CO2 22 27  --  24 20*  GLUCOSE 121* 108*  --  101* 113*  BUN 10 9  --  7 6  CALCIUM 9.0 8.2*  --  7.9* 8.6*  CREATININE 0.40* 0.80  --  0.51 0.44  GFRNONAA >60 >60  --  >60 >60  GFRAA >60 >60  --  >60 >60    LIVER FUNCTION TESTS: Recent Labs    07/06/19 1817 07/09/19 0420  BILITOT 1.1 1.0  AST 77* 26  ALT 21 11  ALKPHOS 73 47  PROT 8.3* 6.6  ALBUMIN 3.5 2.7*    TUMOR MARKERS: No results for input(s): AFPTM, CEA, CA199, CHROMGRNA in the last 8760 hours.  Assessment and Plan:  Sarah Serrano is a 43 y.o. female with past medical history significant for bipolar disorder, alcohol and polysubstance abuse who is admitted with severe thrombocytopenia.  Please perform CT-guided bone marrow biopsy for tissue diagnostic purposes.  Risks and benefits of CT-guided bone marrow biopsy and aspiration was discussed with the patient and/or patient's family including, but not limited to bleeding, infection, damage to adjacent structures or low yield requiring additional tests.  All of the questions were answered and there is agreement to proceed.  Consent signed and in chart.  Thank you for this interesting consult.  I greatly enjoyed meeting Sarah Serrano and look forward to participating in  their care.  A copy of this report was sent to the requesting provider on this date.  Electronically Signed: Sandi Mariscal, MD 07/10/2019, 10:55 AM   I spent a total of 20 Minutes in face to face in clinical consultation, greater than 50% of which was counseling/coordinating care for CT-guided bone marrow biopsy and aspiration

## 2019-07-10 NOTE — Progress Notes (Signed)
Sarah Lame, MD [facility]   [address], [place], [place] [address] Phone: [phone] Fax : [phone]   Subjective: The patient continues to be somewhat confused.  She states that she wants to go home.  She now reports that her abdominal pain is much better but she continues to have back pain.  The patient had a bone marrow biopsy this morning.  Her lipase is 53 with the upper limit of normal being 51.  The patient's platelets have come up to 17 this morning.   Objective: Vital signs in last 24 hours: Vitals:   07/10/19 1036 07/10/19 1040 07/10/19 1043 07/10/19 1132  BP: (!) 173/101  (!) 181/117 (!) 133/96  Pulse: 95 97  97  Resp: 15 17    Temp:      TempSrc:      SpO2: 100% 100%    Weight:      Height:       Weight change:   Intake/Output Summary (Last 24 hours) at 07/10/2019 1341 Last data filed at 07/10/2019 1200 Gross per 24 hour  Intake 3319.75 ml  Output --  Net 3319.75 ml     Exam: Heart:: Regular rate and rhythm, S1S2 present or without murmur or extra heart sounds Lungs: normal and clear to auscultation and percussion Abdomen: soft, nontender, normal bowel sounds   Lab Results: @LABTEST2 @ Micro Results: Recent Results (from the past 240 hour(s))  SARS CORONAVIRUS 2 (TAT 6-24 HRS) Nasopharyngeal Nasopharyngeal Swab     Status: None   Collection Time: 07/07/19  1:34 AM   Specimen: Nasopharyngeal Swab  Result Value Ref Range Status   SARS Coronavirus 2 NEGATIVE NEGATIVE Final    Comment: (NOTE) SARS-CoV-2 target nucleic acids are NOT DETECTED. The SARS-CoV-2 RNA is generally detectable in upper and lower respiratory specimens during the acute phase of infection. Negative results do not preclude SARS-CoV-2 infection, do not rule out co-infections with other pathogens, and should not be used as the sole basis for treatment or other patient management decisions. Negative results must be combined with clinical observations, patient history, and  epidemiological information. The expected result is Negative. Fact Sheet for Patients: SugarRoll.be Fact Sheet for Healthcare Providers: https://www.woods-mathews.com/ This test is not yet approved or cleared by the [place] FDA and  has been authorized for detection and/or diagnosis of SARS-CoV-2 by FDA under an Emergency Use Authorization (EUA). This EUA will remain  in effect (meaning this test can be used) for the duration of the COVID-19 declaration under Section 56 4(b)(1) of the Act, 21 U.S.C. section 360bbb-3(b)(1), unless the authorization is terminated or revoked sooner. Performed at [facility], [place] [address], [place], [place] [address]   SARS Coronavirus 2 [facility] order, Performed in [facility] hospital lab) Nasopharyngeal     Status: None   Collection Time: 07/07/19 10:16 AM   Specimen: Nasopharyngeal  Result Value Ref Range Status   SARS Coronavirus 2 NEGATIVE NEGATIVE Final    Comment: (NOTE) If result is NEGATIVE SARS-CoV-2 target nucleic acids are NOT DETECTED. The SARS-CoV-2 RNA is generally detectable in upper and lower  respiratory specimens during the acute phase of infection. The lowest  concentration of SARS-CoV-2 viral copies this assay can detect is 250  copies / mL. A negative result does not preclude SARS-CoV-2 infection  and should not be used as the sole basis for treatment or other  patient management decisions.  A negative result may occur with  improper specimen collection / handling, submission of  specimen other  than nasopharyngeal swab, presence of viral mutation(s) within the  areas targeted by this assay, and inadequate number of viral copies  (<250 copies / mL). A negative result must be combined with clinical  observations, patient history, and epidemiological information. If result is POSITIVE SARS-CoV-2 target nucleic acids are DETECTED. The SARS-CoV-2 RNA is generally detectable in  upper and lower  respiratory specimens dur ing the acute phase of infection.  Positive  results are indicative of active infection with SARS-CoV-2.  Clinical  correlation with patient history and other diagnostic information is  necessary to determine patient infection status.  Positive results do  not rule out bacterial infection or co-infection with other viruses. If result is PRESUMPTIVE POSTIVE SARS-CoV-2 nucleic acids MAY BE PRESENT.   A presumptive positive result was obtained on the submitted specimen  and confirmed on repeat testing.  While 2019 novel coronavirus  (SARS-CoV-2) nucleic acids may be present in the submitted sample  additional confirmatory testing may be necessary for epidemiological  and / or clinical management purposes  to differentiate between  SARS-CoV-2 and other Sarbecovirus currently known to infect humans.  If clinically indicated additional testing with an alternate test  methodology [phone]) is advised. The SARS-CoV-2 RNA is generally  detectable in upper and lower respiratory sp ecimens during the acute  phase of infection. The expected result is Negative. Fact Sheet for Patients:  StrictlyIdeas.no Fact Sheet for Healthcare Providers: BankingDealers.co.za This test is not yet approved or cleared by the [place] FDA and has been authorized for detection and/or diagnosis of SARS-CoV-2 by FDA under an Emergency Use Authorization (EUA).  This EUA will remain in effect (meaning this test can be used) for the duration of the COVID-19 declaration under Section 564(b)(1) of the Act, 21 U.S.C. section 360bbb-3(b)(1), unless the authorization is terminated or revoked sooner. Performed at [facility], [address] [place], [place] [address]   MRSA PCR Screening     Status: None   Collection Time: 07/07/19 10:16 AM   Specimen: Nasopharyngeal  Result Value Ref Range Status   MRSA by PCR  NEGATIVE NEGATIVE Final    Comment:        The GeneXpert MRSA Assay (FDA approved for NASAL specimens only), is one component of a comprehensive MRSA colonization surveillance program. It is not intended to diagnose MRSA infection nor to guide or monitor treatment for MRSA infections. Performed at [facility], [address] [place],  [address]    Studies/Results: Ct Abdomen Pelvis W Contrast  Result Date: 07/09/2019 CLINICAL DATA:  Acute pancreatitis.  Low platelets. EXAM: CT ABDOMEN AND PELVIS WITH CONTRAST TECHNIQUE: Multidetector CT imaging of the abdomen and pelvis was performed using the standard protocol following bolus administration of intravenous contrast. CONTRAST:  120m OMNIPAQUE IOHEXOL 300 MG/ML  SOLN COMPARISON:  CT head July 06, 2019, July 16, 2014 FINDINGS: Lower chest: Mild LEFT basilar atelectasis. Hepatobiliary: Within the lateral segment of LEFT hepatic lobe 2.5 x 2.1 cm lesion is hyperdense and equal intensity to the portal venous phase vascularity. Lesion is unchanged over multiple comparison exams back to 2015. There is overall low-attenuation in liver consistent with hepatic steatosis. The gallbladder and common bile duct are normal. No biliary duct dilatation Pancreas: Minimal inflammation along the body and tail the pancreas which is slightly improved in the 3 day interval. No pancreatic duct dilatation. No organized fluid collections. Spleen: Normal spleen Adrenals/urinary tract: Adrenal glands and kidneys are normal. The ureters and bladder  normal. Stomach/Bowel: Stomach, small bowel, appendix, and cecum are normal. The colon and rectosigmoid colon are normal. Vascular/Lymphatic: Abdominal aorta is normal caliber. No periportal or retroperitoneal adenopathy. No pelvic adenopathy. Reproductive: Low-attenuation rounded lesion in the RIGHT lateral wall of the uterus measures 6 cm most consistent leiomyoma. This increased in size from 4.7 cm on CT  07/16/2014. Tampon in the vagina. Cystic lesion posterior LEFT of the uterus measuring 4.6 x 2.4 cm (image 66/2) is presumably ovarian cyst. The cystic lesion has simple fluid attenuation and not changed significantly from CT exam 2015. RIGHT ovary normal. Other: No free fluid. Musculoskeletal: No aggressive osseous lesion. IMPRESSION: 1. Improvement in mild pancreatitis compared to CT 3 days prior. No organized fluid collections. 2. Hepatic steatosis. 3. Enhancing lesion in the LEFT hepatic lobe stable of over comparison exams back to 2015. Favor benign vascular lesion. 4. Uterine leiomyoma increased in size from 2015. 5. Cystic lesion in the LEFT adnexa stable from 2015 Electronically Signed   By: Suzy Bouchard M.D.   On: 07/09/2019 11:37   Ct Bone Marrow Biopsy & Aspiration  Result Date: 07/10/2019 INDICATION: History of polysubstance and bipolar disorder, now with thrombocytopenia of uncertain etiology. Please perform CT-guided bone marrow biopsy for tissue diagnostic purposes. EXAM: CT-GUIDED BONE MARROW BIOPSY AND ASPIRATION MEDICATIONS: None ANESTHESIA/SEDATION: Fentanyl 150 mcg IV; Versed 5 mg IV Sedation Time: 23 Minutes; The patient was continuously monitored during the procedure by the interventional radiology nurse under my direct supervision. COMPLICATIONS: None immediate. PROCEDURE: Informed consent was obtained via the use of a emergency consent given the patient has been deemed unable to make medical decisions by psychiatry consult. A time out was performed prior to the initiation of the procedure. The patient was positioned prone and non-contrast localization CT was performed of the pelvis to demonstrate the iliac marrow spaces. The operative site was prepped and draped in the usual sterile fashion. Under sterile conditions and local anesthesia, a 22 gauge spinal needle was utilized for procedural planning. Next, an 11 gauge coaxial bone biopsy needle was advanced into the left iliac marrow  space. Needle position was confirmed with CT imaging. Initially, bone marrow aspiration was performed. Next, a bone marrow biopsy was obtained with the 11 gauge outer bone marrow device. The 11 gauge coaxial bone biopsy needle was re-advanced into a slightly different location within the left iliac marrow space, positioning was confirmed and an additional bone marrow biopsy was obtained. The needle was removed intact. Hemostasis was obtained with compression and a dressing was placed. The patient tolerated the procedure well without immediate post procedural complication. IMPRESSION: Successful CT guided left iliac bone marrow aspiration and core biopsy. Note, despite the administration of a significant amount of conscious sedation medications, the patient overall tolerated the procedure poorly. Electronically Signed   By: Sandi Mariscal M.D.   On: 07/10/2019 11:36   Medications: I have reviewed the patient's current medications. Scheduled Meds:  cyanocobalamin  1,000 mcg Intramuscular Daily   folic acid  1 mg Oral Daily   LORazepam  0-4 mg Intravenous Q12H   metoprolol tartrate  50 mg Oral BID   multivitamin with minerals  1 tablet Oral Daily   naloxone  1 spray Nasal Once   nitroGLYCERIN  0.5 inch Topical Q6H   pantoprazole  20 mg Oral Daily   predniSONE  80 mg Oral Q breakfast   QUEtiapine  300 mg Oral BID   sodium chloride flush  10-40 mL Intracatheter Q12H   thiamine  100 mg Oral Daily   Or   thiamine  100 mg Intravenous Daily   Continuous Infusions:  sodium chloride Stopped (07/09/19 1311)   sodium chloride Stopped (07/10/19 1123)   0.9 % NaCl with KCl 20 mEq / L 125 mL/hr at 07/10/19 1123   levETIRAcetam Stopped (07/10/19 1200)   PRN Meds:.sodium chloride, acetaminophen **OR** acetaminophen, chlorpheniramine-HYDROcodone, cyclobenzaprine, gabapentin, HYDROmorphone (DILAUDID) injection, iohexol, iohexol, LORazepam, LORazepam **OR** LORazepam, metoCLOPramide (REGLAN)  injection, metoprolol tartrate, morphine injection, nitroGLYCERIN, ondansetron **OR** ondansetron (ZOFRAN) IV, prochlorperazine, sodium chloride flush   Assessment: Principal Problem:   Pancreatitis Active Problems:   Severe thrombocytopenia ([facility])   Anemia   Bipolar disorder ([place])   Pancytopenia ([place])   Encounter for competency evaluation    Plan: This patient has a history of pancreatitis with improving of her abdominal pain and her CT scan showed improvement of her already reported to be mild pancreatitis.  Nothing further to add from a GI point of view since her pancreatitis is getting better.  The patient has also been started on steroids for her thrombocytopenia.  Will follow from a far in case the pancreatitis recurs.    LOS: 3 days   Sarah Serrano 07/10/2019, 1:41 PM

## 2019-07-11 ENCOUNTER — Inpatient Hospital Stay: Payer: Medicaid Other

## 2019-07-11 DIAGNOSIS — F3160 Bipolar disorder, current episode mixed, unspecified: Secondary | ICD-10-CM

## 2019-07-11 LAB — COMPREHENSIVE METABOLIC PANEL
ALT: 29 U/L (ref 0–44)
AST: 43 U/L — ABNORMAL HIGH (ref 15–41)
Albumin: 2.9 g/dL — ABNORMAL LOW (ref 3.5–5.0)
Alkaline Phosphatase: 52 U/L (ref 38–126)
Anion gap: 8 (ref 5–15)
BUN: 6 mg/dL (ref 6–20)
CO2: 19 mmol/L — ABNORMAL LOW (ref 22–32)
Calcium: 8.9 mg/dL (ref 8.9–10.3)
Chloride: 112 mmol/L — ABNORMAL HIGH (ref 98–111)
Creatinine, Ser: 0.5 mg/dL (ref 0.44–1.00)
GFR calc Af Amer: >60 mL/min (ref >60)
GFR calc non Af Amer: >60 mL/min (ref >60)
Glucose, Bld: 118 mg/dL — ABNORMAL HIGH (ref 70–99)
Potassium: 3.6 mmol/L (ref 3.5–5.1)
Sodium: 139 mmol/L (ref 135–145)
Total Bilirubin: 0.8 mg/dL (ref 0.3–1.2)
Total Protein: 6.9 g/dL (ref 6.5–8.1)

## 2019-07-11 LAB — CBC WITH DIFFERENTIAL/PLATELET
Abs Immature Granulocytes: 0.12 10*3/uL — ABNORMAL HIGH (ref 0.00–0.07)
Basophils Absolute: 0 10*3/uL (ref 0.0–0.1)
Basophils Relative: 0 %
Eosinophils Absolute: 0 10*3/uL (ref 0.0–0.5)
Eosinophils Relative: 0 %
HCT: 32.1 % — ABNORMAL LOW (ref 36.0–46.0)
Hemoglobin: 11.5 g/dL — ABNORMAL LOW (ref 12.0–15.0)
Immature Granulocytes: 4 %
Lymphocytes Relative: 29 %
Lymphs Abs: 0.9 10*3/uL (ref 0.7–4.0)
MCH: 32.3 pg (ref 26.0–34.0)
MCHC: 35.8 g/dL (ref 30.0–36.0)
MCV: 90.2 fL (ref 80.0–100.0)
Monocytes Absolute: 0.3 10*3/uL (ref 0.1–1.0)
Monocytes Relative: 11 %
Neutro Abs: 1.7 10*3/uL (ref 1.7–7.7)
Neutrophils Relative %: 56 %
Platelets: 15 10*3/uL — CL (ref 150–400)
RBC: 3.56 MIL/uL — ABNORMAL LOW (ref 3.87–5.11)
RDW: 16.4 % — ABNORMAL HIGH (ref 11.5–15.5)
Smear Review: DECREASED
WBC: 3.1 10*3/uL — ABNORMAL LOW (ref 4.0–10.5)
nRBC: 6.2 % — ABNORMAL HIGH (ref 0.0–0.2)

## 2019-07-11 LAB — PATHOLOGIST SMEAR REVIEW

## 2019-07-11 LAB — MAGNESIUM: Magnesium: 1.8 mg/dL (ref 1.7–2.4)

## 2019-07-11 LAB — BASIC METABOLIC PANEL
Anion gap: 7 (ref 5–15)
BUN: 6 mg/dL (ref 6–20)
CO2: 18 mmol/L — ABNORMAL LOW (ref 22–32)
Calcium: 8.9 mg/dL (ref 8.9–10.3)
Chloride: 114 mmol/L — ABNORMAL HIGH (ref 98–111)
Creatinine, Ser: 0.48 mg/dL (ref 0.44–1.00)
GFR calc Af Amer: >60 mL/min (ref >60)
GFR calc non Af Amer: >60 mL/min (ref >60)
Glucose, Bld: 98 mg/dL (ref 70–99)
Potassium: 4.2 mmol/L (ref 3.5–5.1)
Sodium: 139 mmol/L (ref 135–145)

## 2019-07-11 LAB — RETICULOCYTES
Immature Retic Fract: 52 % — ABNORMAL HIGH (ref 2.3–15.9)
RBC.: 2.65 MIL/uL — ABNORMAL LOW (ref 3.87–5.11)
Retic Count, Absolute: 52.5 10*3/uL (ref 19.0–186.0)
Retic Ct Pct: 2 % (ref 0.4–3.1)

## 2019-07-11 LAB — LIPASE, BLOOD: Lipase: 85 U/L — ABNORMAL HIGH (ref 11–51)

## 2019-07-11 LAB — PROTIME-INR
INR: 1 (ref 0.8–1.2)
Prothrombin Time: 13.2 seconds (ref 11.4–15.2)

## 2019-07-11 LAB — APTT: aPTT: 28 seconds (ref 24–36)

## 2019-07-11 LAB — LACTATE DEHYDROGENASE: LDH: 339 U/L — ABNORMAL HIGH (ref 98–192)

## 2019-07-11 MED ORDER — DEXAMETHASONE 10 MG/ML FOR PEDIATRIC ORAL USE
40.0000 mg | Freq: Every day | INTRAMUSCULAR | Status: DC
  Administered 2019-07-11 – 2019-07-13 (×3): 40 mg via ORAL
  Filled 2019-07-11 (×8): qty 4

## 2019-07-11 MED ORDER — DEXAMETHASONE 4 MG PO TABS: 40.0000 mg | ORAL_TABLET | Freq: Every day | ORAL | Status: DC

## 2019-07-11 MED ORDER — LORAZEPAM 2 MG/ML IJ SOLN
0.0000 mg | Freq: Two times a day (BID) | INTRAMUSCULAR | Status: DC
  Administered 2019-07-11: 20:00:00 1 mg via INTRAVENOUS
  Filled 2019-07-11: qty 1

## 2019-07-11 MED ORDER — MAGNESIUM SULFATE 2 GM/50ML IV SOLN
2.0000 g | Freq: Once | INTRAVENOUS | Status: AC
  Administered 2019-07-11: 09:00:00 2 g via INTRAVENOUS
  Filled 2019-07-11: qty 50

## 2019-07-11 MED ORDER — GADOBUTROL 1 MMOL/ML IV SOLN
7.0000 mL | Freq: Once | INTRAVENOUS | Status: AC | PRN
  Administered 2019-07-11: 14:00:00 7 mL via INTRAVENOUS

## 2019-07-11 MED ORDER — NICOTINE 14 MG/24HR TD PT24
14.0000 mg | MEDICATED_PATCH | Freq: Every day | TRANSDERMAL | Status: DC
  Administered 2019-07-11 – 2019-07-14 (×4): 14 mg via TRANSDERMAL
  Filled 2019-07-11 (×4): qty 1

## 2019-07-11 NOTE — Progress Notes (Signed)
I received page from [facility] transfer center [phone]) for this patient transfer related questions.  For some reason rounding hospitalist and this transfer center were not able to connect.  Dr. Janese Banks (oncology) reached out to me with update to Dr. Lenoria Chime at [facility] has accepted the patient but there are no beds there so placed on the waiting list.  I have made rounding hospitalist be aware if I do not hear from [facility].

## 2019-07-11 NOTE — Progress Notes (Addendum)
[place] [facility] [place] NAME: Sarah Serrano    MR#:  [id number]  DATE OF BIRTH:  05-02-1976  SUBJECTIVE:  CHIEF COMPLAINT: Patient is reporting abdominal pain wants to take IV pain medicine and leave to [place] to see her mom as she has been struggling with breast cancer for the past 10 years  patient is transferred to ICU on 07/07/2019 Back to the floor on 07/08/2019 According to psychiatry patient is medically incompetent to make decisions Her boyfriend Legrand Como healthcare power of attorney   REVIEW OF SYSTEMS:   cONSTITUTIONAL: Reports weakness.  EYES: No blurred or double vision.  RESPIRATORY: No cough, shortness of breath, or hemoptysis.  CARDIOVASCULAR: No chest pain, orthopnea, edema.  GASTROINTESTINAL: No nausea, vomiting, diarrhea.  Reporting lower abdominal pain and back pain HEMATOLOGY: No anemia, easy bruising or bleeding SKIN: No rash or lesion.  No purpura NEUROLOGIC: No tingling, numbness, weakness.  Psychologic-anxious  DRUG ALLERGIES:  No Known Allergies  VITALS:  Blood pressure (!) 156/102, pulse 97, temperature 97.8 F (36.6 C), temperature source Oral, resp. rate 18, height _0  (1.626 m), weight 74.2 kg, last menstrual period 07/05/2019, SpO2 99 %.  PHYSICAL EXAMINATION:  GENERAL:  43 y.o.-year-old patient lying in the bed with no acute distress.  EYES: Pupils equal, round, reactive to light and accommodation. No scleral icterus. Extraocular muscles intact.  HEENT: Head atraumatic, normocephalic. Oropharynx and nasopharynx clear.  NECK:  Supple, no jugular venous distention. No thyroid enlargement, no tenderness.  LUNGS: Normal breath sounds bilaterally, no wheezing, rales,rhonchi or crepitation. No use of accessory muscles of respiration.  CARDIOVASCULAR: S1, S2 normal. No murmurs, rubs, or gallops.  ABDOMEN: Soft, lower abdominal tenderness no rebound tenderness nondistended. Bowel sounds present.   EXTREMITIES: No pedal edema, cyanosis, or clubbing.  NEUROLOGIC: Awake and alert in between the seizure episodes and answering few questions gait not checked for safety PSYCHIATRIC: The patient is alert and talking in between the seizure episodes.  SKIN: No obvious rash, lesion, or ulcer.  No bruises noticed   LABORATORY PANEL:   CBC Recent Labs  Lab 07/11/19 0440  WBC 3.1*  HGB 11.5*  HCT 32.1*  PLT 15*   ------------------------------------------------------------------------------------------------------------------  Chemistries  Recent Labs  Lab 07/11/19 0440 07/11/19 1046  NA 139 139  K 4.2 3.6  CL 114* 112*  CO2 18* 19*  GLUCOSE 98 118*  BUN 6 6  CREATININE 0.48 0.50  CALCIUM 8.9 8.9  MG 1.8  --   AST  --  43*  ALT  --  29  ALKPHOS  --  52  BILITOT  --  0.8   ------------------------------------------------------------------------------------------------------------------  Cardiac Enzymes No results for input(s): TROPONINI in the last 168 hours. ------------------------------------------------------------------------------------------------------------------  RADIOLOGY:  Mr Jeri Cos Wo Contrast  Result Date: 07/11/2019 CLINICAL DATA:  Encephalopathy EXAM: MRI HEAD WITHOUT AND WITH CONTRAST TECHNIQUE: Multiplanar, multiecho pulse sequences of the brain and surrounding structures were obtained without and with intravenous contrast. CONTRAST:  60m GADAVIST GADOBUTROL 1 MMOL/ML IV SOLN COMPARISON:  CT head 07/07/2019 FINDINGS: Brain: Ventricle size and cerebral volume normal. Negative for infarct, hemorrhage, mass. No edema or midline shift. Normal enhancement postcontrast administration Vascular: Normal arterial flow voids. Skull and upper cervical spine: Negative Sinuses/Orbits: Negative Other: None IMPRESSION: Negative MRI head with contrast. Electronically Signed   By: CFranchot GalloM.D.   On: 07/11/2019 14:29   Ct Bone Marrow Biopsy & Aspiration  Result  Date: 07/10/2019 INDICATION: History of  polysubstance and bipolar disorder, now with thrombocytopenia of uncertain etiology. Please perform CT-guided bone marrow biopsy for tissue diagnostic purposes. EXAM: CT-GUIDED BONE MARROW BIOPSY AND ASPIRATION MEDICATIONS: None ANESTHESIA/SEDATION: Fentanyl 150 mcg IV; Versed 5 mg IV Sedation Time: 23 Minutes; The patient was continuously monitored during the procedure by the interventional radiology nurse under my direct supervision. COMPLICATIONS: None immediate. PROCEDURE: Informed consent was obtained via the use of a emergency consent given the patient has been deemed unable to make medical decisions by psychiatry consult. A time out was performed prior to the initiation of the procedure. The patient was positioned prone and non-contrast localization CT was performed of the pelvis to demonstrate the iliac marrow spaces. The operative site was prepped and draped in the usual sterile fashion. Under sterile conditions and local anesthesia, a 22 gauge spinal needle was utilized for procedural planning. Next, an 11 gauge coaxial bone biopsy needle was advanced into the left iliac marrow space. Needle position was confirmed with CT imaging. Initially, bone marrow aspiration was performed. Next, a bone marrow biopsy was obtained with the 11 gauge outer bone marrow device. The 11 gauge coaxial bone biopsy needle was re-advanced into a slightly different location within the left iliac marrow space, positioning was confirmed and an additional bone marrow biopsy was obtained. The needle was removed intact. Hemostasis was obtained with compression and a dressing was placed. The patient tolerated the procedure well without immediate post procedural complication. IMPRESSION: Successful CT guided left iliac bone marrow aspiration and core biopsy. Note, despite the administration of a significant amount of conscious sedation medications, the patient overall tolerated the procedure  poorly. Electronically Signed   By: Sandi Mariscal M.D.   On: 07/10/2019 11:36    EKG:   Orders placed or performed during the hospital encounter of 07/06/19  . EKG 12-Lead  . EKG 12-Lead  . ED EKG  . 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  Consent obtained from her boyfriend Legrand Como Patient is started on steroids, GI is agreeable with the steroids as benefits outweigh the risks and pancreatitis is clinically improving Platelet count is at 15,000 Hemoglobin 11.5 MRI of the brain with and without contrast is negative Peripheral smear with schistocytes per hematology.  Concern about TTP Plan is to transfer her to tertiary care center.  Call [facility] and discussed with hematology they have refused taking patient as she is stable from their standpoint.  Called and discussed with [facility] to transfer the patient, they cannot accept the patient today, requested to call them back tomorrow Called and talked to [facility] no beds available for 24-48 hrs   will attempt to transfer the patient to other tertiary care centers  We will transfuse 1 unit of platelets  #Altered mental status CT head is negative MRI of the brain is also negative Patient is medically incompetent per psychiatry.  IVC papers in place  #Acute pancreatitis alcohol induced Clinically improving clear liquid diet as she is clinically improving  IV fluids Pain medications as needed   supportive treatment CT abdomen has revealed acute pancreatitis with no abscess Repeat CT abdomen  on  07/09/2019 revealed improving acute pancreatitis compared to prior study of CT scan.  Enhancing lesion in the left hepatic lobe favoring benign vascular lesion and uterine leiomyoma increased in size from May 2015 GI Dr. Allen Norris has seen the patient and will remotely follow as she is clinically improving lipase at 140-89-85  #Symptomatic anemia no active bleeding GI is following Stool for occult blood-negative Hemoglobin 12.6-10.3-8.5-7.1 given 1 unit of blood transfusion hemoglobin at 8.4-11.5 Repeating B12 and folate  #Recurrent breakthrough seizures, from alcohol withdrawal IV Ativan as needed 1 g IV Keppra given and neurology is recommending to continue Keppra 500 every 12 hours Stat CT head is negative Dr. Doy Mince is following EEG ordered but patient has refused EEG and further work-up  #Hypomagnesemia and hypokalemia replete and recheck IV magnesium ordered, repeat magnesium is at 2.0  #Essential hypertension Blood pressure is soft Nitropaste to the anterior chest wall will be discontinued IV labetalol as needed CT head is negative Clonidine discontinued in view of thrombocytopenia, pharmacy is concerned it might cause thrombocytopenia.  Discontinued clonidine and patient restarted on metoprolol dose increased to 50 mg p.o. twice daily will titrate as needed  #Hepatic lobe lesion seems to be benign PCP to follow-up and refer to hepatologist if needed  #Uterine leiomyoma outpatient follow-up with GYN      All the records are reviewed and case discussed with Care Management/Social Workerr. Management plans discussed with the patient, family and they are in agreement.  CODE STATUS: fc   TOTAL TIME TAKING CARE OF THIS PATIENT including coordination of care: More than 2 hours    POSSIBLE D/C IN  ?? DAYS, DEPENDING ON CLINICAL CONDITION.  Note: This dictation was prepared with Dragon dictation along with smaller phrase technology. Any transcriptional errors that result from  this process are unintentional.   Nicholes Mango M.D on 07/11/2019 at 3:02 PM  Between 7am to 6pm - Pager - [phone] After 6pm go to [email or web address] - password [email or web address] [facility]  Office  [phone]  CC: Primary care physician; Patient, No Pcp Per

## 2019-07-11 NOTE — Consult Note (Addendum)
Pharmacy Consult Note:   Pharmacy consult: Investigate thrombocytopenia as it relates to home medications:   Assessment:  Pharmacist review PTA medications and found the following information:  -clonidine listed as 1%-10% -gabapentin- post marketing event (no rates listed) -sertraline -  immune thrombocytopenia; post marketing event (no rates listed)  Update: Clonidine and sertraline were discontinued on 07/09/2019 as a result of the above information presented to MD.   Plan:  1. Will monitor for rebound hypertension given clonidine was discontinued without tapering. Patient is on metoprolol and is a recent dose increase as of yesterday. If blood pressure remains elevated, may consider switching to carvedilol for more alpha-blocking activity to help lower blood pressure.   2. Will continue to monitor platelets.   Kristeen Miss, PharmD Clinical Pharmacist

## 2019-07-11 NOTE — Progress Notes (Addendum)
Hematology/Oncology Consult note [facility]  [phone] [phone]  Patient Care Team: Patient, No Pcp Per as PCP - General (General Practice)   Name of the patient: Sarah Serrano  [id number]  18-Jul-1976   Date of visit: 07/11/2019   Interval history-she is oriented to self place and person.  Reports she still has abdominal pain unchanged since yesterday.  She just finished all of her lunch but a pain that has worsened after eating her lunch.  Denies any bleeding   Review of systems- Review of Systems  Constitutional: Positive for malaise/fatigue. Negative for chills, fever and weight loss.  HENT: Negative for congestion, ear discharge and nosebleeds.   Eyes: Negative for blurred vision.  Respiratory: Negative for cough, hemoptysis, sputum production, shortness of breath and wheezing.   Cardiovascular: Negative for chest pain, palpitations, orthopnea and claudication.  Gastrointestinal: Positive for abdominal pain. Negative for blood in stool, constipation, diarrhea, heartburn, melena, nausea and vomiting.  Genitourinary: Negative for dysuria, flank pain, frequency, hematuria and urgency.  Musculoskeletal: Negative for back pain, joint pain and myalgias.  Skin: Negative for rash.  Neurological: Negative for dizziness, tingling, focal weakness, seizures, weakness and headaches.  Endo/Heme/Allergies: Does not bruise/bleed easily.  Psychiatric/Behavioral: Negative for depression and suicidal ideas. The patient does not have insomnia.       No Known Allergies   Past Medical History:  Diagnosis Date   Alcohol abuse    Bipolar 1 disorder (HCC)    Substance abuse ([place])      Past Surgical History:  Procedure Laterality Date   NO PAST SURGERIES      Social History   Socioeconomic History   Marital status: Single    Spouse name: Not on file   Number of children: Not on file   Years of education: Not on file    Highest education level: Not on file  Occupational History   Not on file  Social Needs   Financial resource strain: Not on file   Food insecurity    Worry: Not on file    Inability: Not on file   Transportation needs    Medical: Not on file    Non-medical: Not on file  Tobacco Use   Smoking status: Current Every Day Smoker    Packs/day: 0.50    Types: Cigarettes   Smokeless tobacco: Never Used  Substance and Sexual Activity   Alcohol use: Yes   Drug use: Yes   Sexual activity: Not on file  Lifestyle   Physical activity    Days per week: Not on file    Minutes per session: Not on file   Stress: Not on file  Relationships   Social connections    Talks on phone: Not on file    Gets together: Not on file    Attends religious service: Not on file    Active member of club or organization: Not on file    Attends meetings of clubs or organizations: Not on file    Relationship status: Not on file   Intimate partner violence    Fear of current or ex partner: Not on file    Emotionally abused: Not on file    Physically abused: Not on file    Forced sexual activity: Not on file  Other Topics Concern   Not on file  Social History Narrative   Not on file    Family History  Problem Relation Age of Onset   Bipolar disorder Mother  Current Facility-Administered Medications:    0.9 %  sodium chloride infusion, , Intravenous, PRN, Gouru, Aruna, MD, Stopped at 07/09/19 1311   0.9 %  sodium chloride infusion, , Intravenous, Continuous, Corrie Mckusick, DO, Stopped at 07/10/19 1123   0.9 % NaCl with KCl 20 mEq/ L  infusion, , Intravenous, Continuous, Gouru, Aruna, MD, Stopped at 07/11/19 1211   acetaminophen (TYLENOL) tablet 650 mg, 650 mg, Oral, Q6H PRN, 650 mg at 07/09/19 0525 **OR** acetaminophen (TYLENOL) suppository 650 mg, 650 mg, Rectal, Q6H PRN, Harrie Foreman, MD, 650 mg at 07/07/19 0506   chlorpheniramine-HYDROcodone (TUSSIONEX) 10-8 MG/5ML  suspension 5 mL, 5 mL, Oral, QHS PRN, Harrie Foreman, MD   cyanocobalamin ((VITAMIN B-12)) injection 1,000 mcg, 1,000 mcg, Intramuscular, Daily, Gouru, Aruna, MD, 1,000 mcg at 07/11/19 0835   cyclobenzaprine (FLEXERIL) tablet 10 mg, 10 mg, Oral, BID PRN, Gouru, Aruna, MD, 10 mg at 07/09/19 1457   dexamethasone (DECADRON) 10 MG/ML injection for Pediatric ORAL use 40 mg, 40 mg, Oral, Daily, Sindy Guadeloupe, MD, 40 mg at 62/22/97 9892   folic acid (FOLVITE) tablet 1 mg, 1 mg, Oral, Daily, Harrie Foreman, MD, 1 mg at 07/11/19 0835   gabapentin (NEURONTIN) capsule 300 mg, 300 mg, Oral, BID PRN, Gouru, Aruna, MD, 300 mg at 07/09/19 2248   HYDROmorphone (DILAUDID) injection 0.5 mg, 0.5 mg, Intravenous, Q3H PRN, Harrie Foreman, MD, 0.5 mg at 07/11/19 0828   iohexol (OMNIPAQUE) 9 MG/ML oral solution 500 mL, 500 mL, Oral, BID PRN, Harrie Foreman, MD, 500 mL at 07/06/19 1745   iohexol (OMNIPAQUE) 9 MG/ML oral solution 500 mL, 500 mL, Oral, Once PRN, Sindy Guadeloupe, MD, 500 mL at 07/09/19 0859   levETIRAcetam (KEPPRA) IVPB 500 mg/100 mL premix, 500 mg, Intravenous, Q12H, Alexis Goodell, MD, Last Rate: 400 mL/hr at 07/11/19 1106, 500 mg at 07/11/19 1106   LORazepam (ATIVAN) injection 1 mg, 1 mg, Intravenous, Q4H PRN, Gouru, Aruna, MD, 1 mg at 07/07/19 0932   LORazepam (ATIVAN) injection 1 mg, 1 mg, Intravenous, Once, Gouru, Aruna, MD   metoCLOPramide (REGLAN) injection 5 mg, 5 mg, Intravenous, Q6H PRN, Lance Coon, MD, 5 mg at 07/11/19 0655   metoprolol tartrate (LOPRESSOR) injection 5 mg, 5 mg, Intravenous, Q4H PRN, Gouru, Aruna, MD, 5 mg at 07/09/19 0554   metoprolol tartrate (LOPRESSOR) tablet 50 mg, 50 mg, Oral, BID, Gouru, Aruna, MD, 50 mg at 07/11/19 0835   morphine 2 MG/ML injection 2 mg, 2 mg, Intravenous, Q4H PRN, Harrie Foreman, MD, 2 mg at 07/11/19 1159   multivitamin with minerals tablet 1 tablet, 1 tablet, Oral, Daily, Harrie Foreman, MD, 1 tablet at  07/11/19 0835   naloxone (NARCAN) nasal spray 4 mg/0.1 mL, 1 spray, Nasal, Once, Gouru, Aruna, MD, Stopped at 07/07/19 0830   nitroGLYCERIN (NITROGLYN) 2 % ointment 0.5 inch, 0.5 inch, Topical, Q6H, Harrie Foreman, MD, 0.5 inch at 07/11/19 0548   nitroGLYCERIN (NITROSTAT) SL tablet 0.4 mg, 0.4 mg, Sublingual, Q5 min PRN, Gouru, Aruna, MD, 0.4 mg at 07/07/19 0815   ondansetron (ZOFRAN) tablet 4 mg, 4 mg, Oral, Q6H PRN **OR** ondansetron (ZOFRAN) injection 4 mg, 4 mg, Intravenous, Q6H PRN, Harrie Foreman, MD, 4 mg at 07/08/19 0204   pantoprazole (PROTONIX) EC tablet 20 mg, 20 mg, Oral, Daily, Sindy Guadeloupe, MD, 20 mg at 07/11/19 0835   prochlorperazine (COMPAZINE) injection 10 mg, 10 mg, Intravenous, Q6H PRN, Harrie Foreman, MD, 10 mg at 07/09/19 0202  QUEtiapine (SEROQUEL) tablet 300 mg, 300 mg, Oral, BID, Money, Darnelle Maffucci B, FNP, 300 mg at 07/11/19 0835   sodium chloride flush (NS) 0.9 % injection 10-40 mL, 10-40 mL, Intracatheter, Q12H, Gouru, Aruna, MD, 10 mL at 07/11/19 0837   sodium chloride flush (NS) 0.9 % injection 10-40 mL, 10-40 mL, Intracatheter, PRN, Gouru, Aruna, MD   thiamine (VITAMIN B-1) tablet 100 mg, 100 mg, Oral, Daily, 100 mg at 07/11/19 0839 **OR** thiamine (B-1) injection 100 mg, 100 mg, Intravenous, Daily, Harrie Foreman, MD, 100 mg at 07/08/19 0914  Physical exam:  Vitals:   07/10/19 1132 07/10/19 1556 07/10/19 2219 07/11/19 0700  BP: (!) 133/96 (!) 147/87 140/89 (!) 156/102  Pulse: 97 91 98 97  Resp:  18    Temp:  (!) 97.5 F (36.4 C)  97.8 F (36.6 C)  TempSrc:  Oral  Oral  SpO2:  98%  99%  Weight:      Height:       Physical Exam Constitutional:      Comments: Appears mildly drowsy but easily arousable  HENT:     Head: Normocephalic and atraumatic.  Eyes:     Pupils: Pupils are equal, round, and reactive to light.  Neck:     Musculoskeletal: Normal range of motion.  Cardiovascular:     Rate and Rhythm: Normal rate and regular  rhythm.     Heart sounds: Normal heart sounds.  Pulmonary:     Effort: Pulmonary effort is normal.     Breath sounds: Normal breath sounds.  Abdominal:     General: Bowel sounds are normal.     Palpations: Abdomen is soft.     Comments: Mild lower abdominal tenderness  Skin:    General: Skin is warm and dry.  Neurological:     General: No focal deficit present.     Mental Status: She is oriented to person, place, and time.      CMP Latest Ref Rng & Units 07/11/2019  Glucose 70 - 99 mg/dL 118(H)  BUN 6 - 20 mg/dL 6  Creatinine 0.44 - 1.00 mg/dL 0.50  Sodium 135 - 145 mmol/L 139  Potassium 3.5 - 5.1 mmol/L 3.6  Chloride 98 - 111 mmol/L 112(H)  CO2 22 - 32 mmol/L 19(L)  Calcium 8.9 - 10.3 mg/dL 8.9  Total Protein 6.5 - 8.1 g/dL 6.9  Total Bilirubin 0.3 - 1.2 mg/dL 0.8  Alkaline Phos 38 - 126 U/L 52  AST 15 - 41 U/L 43(H)  ALT 0 - 44 U/L 29   CBC Latest Ref Rng & Units 07/11/2019  WBC 4.0 - 10.5 K/uL 3.1(L)  Hemoglobin 12.0 - 15.0 g/dL 11.5(L)  Hematocrit 36.0 - 46.0 % 32.1(L)  Platelets 150 - 400 K/uL 15(LL)    _0 @  Ct Head Wo Contrast  Result Date: 07/07/2019 CLINICAL DATA:  Patient found down this morning after a fall. Initial encounter. EXAM: CT HEAD WITHOUT CONTRAST TECHNIQUE: Contiguous axial images were obtained from the base of the skull through the vertex without intravenous contrast. COMPARISON:  Head CT scan 07/16/2014. FINDINGS: Brain: No evidence of acute infarction, hemorrhage, hydrocephalus, extra-axial collection or mass lesion/mass effect. A few calcifications in the basal ganglia bilaterally are unchanged. Vascular: No hyperdense vessel or unexpected calcification. Skull: Intact.  No focal lesion. Sinuses/Orbits: Negative. Other: None. IMPRESSION: Negative head CT. Electronically Signed   By: Inge Rise M.D.   On: 07/07/2019 09:08   Ct Abdomen Pelvis W Contrast  Result Date: 07/09/2019 CLINICAL DATA:  Acute pancreatitis.  Low platelets. EXAM: CT  ABDOMEN AND PELVIS WITH CONTRAST TECHNIQUE: Multidetector CT imaging of the abdomen and pelvis was performed using the standard protocol following bolus administration of intravenous contrast. CONTRAST:  163m OMNIPAQUE IOHEXOL 300 MG/ML  SOLN COMPARISON:  CT head July 06, 2019, July 16, 2014 FINDINGS: Lower chest: Mild LEFT basilar atelectasis. Hepatobiliary: Within the lateral segment of LEFT hepatic lobe 2.5 x 2.1 cm lesion is hyperdense and equal intensity to the portal venous phase vascularity. Lesion is unchanged over multiple comparison exams back to 2015. There is overall low-attenuation in liver consistent with hepatic steatosis. The gallbladder and common bile duct are normal. No biliary duct dilatation Pancreas: Minimal inflammation along the body and tail the pancreas which is slightly improved in the 3 day interval. No pancreatic duct dilatation. No organized fluid collections. Spleen: Normal spleen Adrenals/urinary tract: Adrenal glands and kidneys are normal. The ureters and bladder normal. Stomach/Bowel: Stomach, small bowel, appendix, and cecum are normal. The colon and rectosigmoid colon are normal. Vascular/Lymphatic: Abdominal aorta is normal caliber. No periportal or retroperitoneal adenopathy. No pelvic adenopathy. Reproductive: Low-attenuation rounded lesion in the RIGHT lateral wall of the uterus measures 6 cm most consistent leiomyoma. This increased in size from 4.7 cm on CT 07/16/2014. Tampon in the vagina. Cystic lesion posterior LEFT of the uterus measuring 4.6 x 2.4 cm (image 66/2) is presumably ovarian cyst. The cystic lesion has simple fluid attenuation and not changed significantly from CT exam 2015. RIGHT ovary normal. Other: No free fluid. Musculoskeletal: No aggressive osseous lesion. IMPRESSION: 1. Improvement in mild pancreatitis compared to CT 3 days prior. No organized fluid collections. 2. Hepatic steatosis. 3. Enhancing lesion in the LEFT hepatic lobe stable of over  comparison exams back to 2015. Favor benign vascular lesion. 4. Uterine leiomyoma increased in size from 2015. 5. Cystic lesion in the LEFT adnexa stable from 2015 Electronically Signed   By: SSuzy BouchardM.D.   On: 07/09/2019 11:37   Ct Abdomen Pelvis W Contrast  Result Date: 07/06/2019 CLINICAL DATA:  Acute, generalized abdominal pain and vomiting. Shortness of breath. EXAM: CT ABDOMEN AND PELVIS WITH CONTRAST TECHNIQUE: Multidetector CT imaging of the abdomen and pelvis was performed using the standard protocol following bolus administration of intravenous contrast. CONTRAST:  1075mOMNIPAQUE IOHEXOL 300 MG/ML  SOLN COMPARISON:  07/16/2014. FINDINGS: Lower chest: Unremarkable. Hepatobiliary: Marked diffuse low density of the liver relative to the spleen. The liver is enlarged, most pronounced involving the lateral segment left lobe and caudate lobe. Mild diffuse gallbladder wall enhancement without thickening or pericholecystic fluid. Pancreas: Diffuse peripancreatic soft tissue stranding and edema. This tracks superiorly and anteriorly. No discrete fluid collections are seen. Spleen: Normal in size without focal abnormality. Adrenals/Urinary Tract: Adrenal glands are unremarkable. Kidneys are normal, without renal calculi, focal lesion, or hydronephrosis. Bladder is unremarkable. Stomach/Bowel: Mild low density distal gastric wall thickening. Normal appearing appendix, small bowel and colon. Vascular/Lymphatic: No significant vascular findings are present. No enlarged abdominal or pelvic lymph nodes. Reproductive: Enlarged, lobulated uterus containing coarse calcifications. Previously, with intravenous contrast, there were corresponding uterine masses. A simple appearing left ovarian cyst has not changed significantly, measuring 4.4 x 2.3 cm today on image number 62 series 2, previously 3.8 x 2.4 cm. Normal appearing right ovary containing a small follicular cyst. Other: Tiny umbilical hernia containing  fat. Musculoskeletal: Unremarkable bones. IMPRESSION: 1. Acute pancreatitis without abscess. 2. Mild low density distal gastric wall thickening, most likely secondary to the acute pancreatitis.  Concomitant acute gastritis is less likely. 3. Marked diffuse hepatic steatosis. 4. Hepatomegaly. 5. Stable simple appearing left ovarian cyst. Electronically Signed   By: Claudie Revering M.D.   On: 07/06/2019 21:26   Dg Chest Portable 1 View  Result Date: 07/06/2019 CLINICAL DATA:  PT to ER with c/o vomiting that started last night. Pt states had chest pressure that started prior to vomiting, and now she describes it as hurting. Pt actively vomiting in triage, diaphoretic. Pt states SHOB. EXAM: PORTABLE CHEST 1 VIEW COMPARISON:  Chest radiographs 11/22/2013, 02/16/2012 FINDINGS: Stable cardiomediastinal contours within normal limits. The lungs are clear. No pneumothorax or large pleural effusion. No acute finding in the visualized skeleton. IMPRESSION: No evidence of active disease. Electronically Signed   By: Audie Pinto M.D.   On: 07/06/2019 17:53   Ct Bone Marrow Biopsy & Aspiration  Result Date: 07/10/2019 INDICATION: History of polysubstance and bipolar disorder, now with thrombocytopenia of uncertain etiology. Please perform CT-guided bone marrow biopsy for tissue diagnostic purposes. EXAM: CT-GUIDED BONE MARROW BIOPSY AND ASPIRATION MEDICATIONS: None ANESTHESIA/SEDATION: Fentanyl 150 mcg IV; Versed 5 mg IV Sedation Time: 23 Minutes; The patient was continuously monitored during the procedure by the interventional radiology nurse under my direct supervision. COMPLICATIONS: None immediate. PROCEDURE: Informed consent was obtained via the use of a emergency consent given the patient has been deemed unable to make medical decisions by psychiatry consult. A time out was performed prior to the initiation of the procedure. The patient was positioned prone and non-contrast localization CT was performed of the pelvis  to demonstrate the iliac marrow spaces. The operative site was prepped and draped in the usual sterile fashion. Under sterile conditions and local anesthesia, a 22 gauge spinal needle was utilized for procedural planning. Next, an 11 gauge coaxial bone biopsy needle was advanced into the left iliac marrow space. Needle position was confirmed with CT imaging. Initially, bone marrow aspiration was performed. Next, a bone marrow biopsy was obtained with the 11 gauge outer bone marrow device. The 11 gauge coaxial bone biopsy needle was re-advanced into a slightly different location within the left iliac marrow space, positioning was confirmed and an additional bone marrow biopsy was obtained. The needle was removed intact. Hemostasis was obtained with compression and a dressing was placed. The patient tolerated the procedure well without immediate post procedural complication. IMPRESSION: Successful CT guided left iliac bone marrow aspiration and core biopsy. Note, despite the administration of a significant amount of conscious sedation medications, the patient overall tolerated the procedure poorly. Electronically Signed   By: Sandi Mariscal M.D.   On: 07/10/2019 11:36     Assessment and plan- Patient is a 43 y.o. female with history of acute alcohol intake over the last 2 weeks admitted for acute pancreatitis complicated by severe thrombocytopenia  Severe thrombocytopenia: Her smear was reviewed for the last 4 days.  There was no significant evidence of schistocytes noted prior.  Today her smear does look different from her prior smears.  There is more variation in the RBC size and I could see schistocytes.  I personally reviewed the smear today along with Dr. Reuel Derby.  Dr. Reuel Derby has taken a look at her smear and Did report new findings of schistocytes.  Whether this truly represents clinical TTP is still uncertain.  There is still no evidence of hemolysis based on normal bilirubin low normal reticulocyte count.   LDH is mildly elevated at 300 and overall better since admission.  Kidney functions are normal.  Continue Decadron for now.  ADAMTS 13 levels sent out today and likely to be reported tomorrow. However normal levels can be seen in acute pancreatitis causing thrombotic microangiopathy and should not change management based on that.   There is still no great explanation for her severe thrombocytopenia which has not responded to platelet transfusion.  She was started on steroids 2 days ago and there has not been a significant improvement in her platelet counts despite that.  She is also undergone a bone marrow biopsy yesterday.  Her coagulation studies have been normal arguing against DIC.  She has had severe thrombocytopenia since admission and therefore HIT is not in the picture.  No prior history of antibiotics to suggest drug-induced ITP.    While all this could still be acute bone marrow suppression the new findings of schistocytes now brings TTP back into the picture which is 1 of the known complications of acute pancreatitis.  I will therefore recommended patient should be transferred to a tertiary care center for possible plasmapheresis at the earliest.  I did speak to [facility] hematology fellow and he did accept the transfer.  We are awaiting  call from [facility] regarding bed availability.  If there is no feedback from [facility] in the next 30 minutes or so I would recommend that we should start looking at other tertiary center such as [facility] or [facility] for possible transfer and I will be happy to speak to hematology there explaining the reason for transfer.  Throughout her hospitalization over the last 4 days patient has had waxing and waning mental status.  On 07/07/2019 patient had a possible seizure the etiology of which is not known if it was alcohol-related.  She has not had any seizures since then.  Patient has been evaluated by psychiatry and been deemed to not have decision-making capacity and is currently  under involuntary commission. I have also tried to get in touch with patient's boyfriend Legrand Como as he is the emergency contact listed on file.  I have not been able to reach him so far but I will keep trying.  I have discussed all this in detail with Dr. Margaretmary Eddy as well emphasizing the need for transfer to a tertiary care center on an urgent basis to initiate possible plasmapheresis    Total face to face encounter time for this patient visit was 60 min. >50% of the time was  spent in counseling and coordination of care.     Visit Diagnosis 1. Acute pancreatitis, unspecified complication status, unspecified pancreatitis type   2. Pancytopenia ([place])   3. Severe thrombocytopenia (HCC)      Dr. Randa Evens, MD, MPH [facility] [phone] 07/11/2019 12:35 PM

## 2019-07-11 NOTE — Consult Note (Addendum)
[facility] Consult   Reason for Consult:  IVC Referring Physician:  EDP Patient Identification: Sarah Serrano MRN:  [id number] Principal Diagnosis: Pancreatitis Diagnosis:  Principal Problem:   Pancreatitis Active Problems:   Bipolar disorder ([place])   Severe thrombocytopenia ([place])   Anemia   Pancytopenia ([place])   Encounter for competency evaluation  Total Time spent with patient: 30 minutes  Subjective:   Sarah Serrano is a 43 y.o. female patient reports her mother has cancer and she needs to catch her plane to go see her.  Fixated on this without realizing her medical need for attention herself.   Patient seen and evaluated by this provider in person.  She was asleep but aroused when awakened.  Kept her eyes closed as she spoke.  Discussed needing to leave to see her mother without any regard for the need to address her medical needs.  She started out sounding logical and became disorganized quickly in conversation.  Appeared tired and drowsy.  Denies suicidal/homicidal ideations, hallucinations but clearly not at her baseline without medical capacity at this time nor a few days ago when she was assessed for capacity.  IVC will be placed to prevent her from leaving the hospital and potentially dying from her medical issues.  HPI:  Per Dr. Cinda Quest: 43 y.o.femalewho reports epigastric pain starting last night with a lot of vomiting. This pain radiates to her back. She gets very sweaty when she vomits. Nurse reported she was soaking wet when she began vomiting in the emergency room. Pain comes up into the chest somewhat. Makes her short of breath. It hurts a little bit when she breathes as well. Pain is moderately severe deep and achy. She is not complaining of fever. No blood in the vomit. No diarrhea. The pain makes her short of breath.  07/09/19:  Patient is also diagnosed with severe thrombocytopenia and suspected bone marrow suppression.  Patient was  seen by me face-to-face.  I was requested to assess the capacity of this patient for medical decision-making.  I assessed the patient yesterday and she was completely alert and oriented.  She appeared to be slightly irritated due to the questioning that she received but answered all questions appropriately.  Today the patient has disorganized thought, memory loss, confusion, and it is been reported that the patient has been talking to herself when she is in the room alone.  Nursing staff reported that at 1 time she got out of bed and went looking for someone that she stated had been in her room and there was no one in there.  I was also contacted by secure chat due to the patient's thrombocytopenia and that the possibility of her sertraline can worsen thrombocytopenia.  Sertraline was discontinued at this time but recommended to continue the Seroquel at 300 mg p.o. twice daily.  At this time the patient does not meet criteria for capacity and if needed due to concerns for patient's confusion and her medical condition she can be IVC'd if needed  Past Psychiatric History: Bipolar disorder  Risk to Self:   Risk to Others:   Prior Inpatient Therapy:   Prior Outpatient Therapy:    Past Medical History:  Past Medical History:  Diagnosis Date  . Alcohol abuse   . Bipolar 1 disorder ([place])   . Substance abuse [facility])     Past Surgical History:  Procedure Laterality Date  . NO PAST SURGERIES     Family History:  Family History  Problem  Relation Age of Onset  . Bipolar disorder Mother    Family Psychiatric  History: See above Social History:  Social History   Substance and Sexual Activity  Alcohol Use Yes     Social History   Substance and Sexual Activity  Drug Use Yes    Social History   Socioeconomic History  . Marital status: Single    Spouse name: Not on file  . Number of children: Not on file  . Years of education: Not on file  . Highest education level: Not on file  Occupational  History  . Not on file  Social Needs  . Financial resource strain: Not on file  . Food insecurity    Worry: Not on file    Inability: Not on file  . Transportation needs    Medical: Not on file    Non-medical: Not on file  Tobacco Use  . Smoking status: Current Every Day Smoker    Packs/day: 0.50    Types: Cigarettes  . Smokeless tobacco: Never Used  Substance and Sexual Activity  . Alcohol use: Yes  . Drug use: Yes  . Sexual activity: Not on file  Lifestyle  . Physical activity    Days per week: Not on file    Minutes per session: Not on file  . Stress: Not on file  Relationships  . Social Herbalist on phone: Not on file    Gets together: Not on file    Attends religious service: Not on file    Active member of club or organization: Not on file    Attends meetings of clubs or organizations: Not on file    Relationship status: Not on file  Other Topics Concern  . Not on file  Social History Narrative  . Not on file   Additional Social History:    Allergies:  No Known Allergies  Labs:  Results for orders placed or performed during the hospital encounter of 07/06/19 (from the past 48 hour(s))  CBC     Status: Abnormal   Collection Time: 07/09/19  9:47 PM  Result Value Ref Range   WBC 2.9 (L) 4.0 - 10.5 K/uL   RBC 2.64 (L) 3.87 - 5.11 MIL/uL   Hemoglobin 8.3 (L) 12.0 - 15.0 g/dL   HCT 23.6 (L) 36.0 - 46.0 %   MCV 89.4 80.0 - 100.0 fL   MCH 31.4 26.0 - 34.0 pg   MCHC 35.2 30.0 - 36.0 g/dL   RDW 15.9 (H) 11.5 - 15.5 %   Platelets 15 (LL) 150 - 400 K/uL    Comment: REPEATED TO VERIFY Immature Platelet Fraction may be clinically indicated, consider ordering this additional test [id number] CRITICAL VALUE NOTED.  VALUE IS CONSISTENT WITH PREVIOUSLY REPORTED AND CALLED VALUE.    nRBC 4.1 (H) 0.0 - 0.2 %    Comment: Performed at [facility], [address] [place], [place] [address]  CBC with Differential/Platelet     Status: Abnormal    Collection Time: 07/10/19  4:05 AM  Result Value Ref Range   WBC 2.9 (L) 4.0 - 10.5 K/uL   RBC 2.65 (L) 3.87 - 5.11 MIL/uL   Hemoglobin 8.4 (L) 12.0 - 15.0 g/dL   HCT 24.4 (L) 36.0 - 46.0 %   MCV 92.1 80.0 - 100.0 fL   MCH 31.7 26.0 - 34.0 pg   MCHC 34.4 30.0 - 36.0 g/dL   RDW 16.7 (H) 11.5 - 15.5 %   Platelets 17 (  LL) 150 - 400 K/uL    Comment: REPEATED TO VERIFY Immature Platelet Fraction may be clinically indicated, consider ordering this additional test [id number] CRITICAL VALUE NOTED.  VALUE IS CONSISTENT WITH PREVIOUSLY REPORTED AND CALLED VALUE.    nRBC 3.1 (H) 0.0 - 0.2 %   Neutrophils Relative % 56 %   Neutro Abs 1.6 (L) 1.7 - 7.7 K/uL   Lymphocytes Relative 37 %   Lymphs Abs 1.1 0.7 - 4.0 K/uL   Monocytes Relative 5 %   Monocytes Absolute 0.2 0.1 - 1.0 K/uL   Eosinophils Relative 0 %   Eosinophils Absolute 0.0 0.0 - 0.5 K/uL   Basophils Relative 0 %   Basophils Absolute 0.0 0.0 - 0.1 K/uL   WBC Morphology See Note    RBC Morphology MORPHOLOGY UNREMARKABLE    Smear Review MORPHOLOGY UNREMARKABLE    Immature Granulocytes 2 %   Abs Immature Granulocytes 0.05 0.00 - 0.07 K/uL   Hypersegmented Neutrophils PRESENT    Polychromasia PRESENT     Comment: Performed at [facility], [address] [place], [place] [id number]  Basic metabolic panel     Status: Abnormal   Collection Time: 07/10/19  4:05 AM  Result Value Ref Range   Sodium 138 135 - 145 mmol/L   Potassium 4.7 3.5 - 5.1 mmol/L   Chloride 110 98 - 111 mmol/L   CO2 20 (L) 22 - 32 mmol/L   Glucose, Bld 113 (H) 70 - 99 mg/dL   BUN 6 6 - 20 mg/dL   Creatinine, Ser 0.44 0.44 - 1.00 mg/dL   Calcium 8.6 (L) 8.9 - 10.3 mg/dL   GFR calc non Af Amer >60 >60 mL/min   GFR calc Af Amer >60 >60 mL/min   Anion gap 8 5 - 15    Comment: Performed at [facility], [address] [place], [place] [address]  Lipase, blood     Status: Abnormal   Collection Time: 07/10/19  4:05 AM  Result Value Ref Range    Lipase 53 (H) 11 - 51 U/L    Comment: Performed at [facility], [address], [place], [place] [address]  Magnesium     Status: None   Collection Time: 07/10/19  4:05 AM  Result Value Ref Range   Magnesium 2.0 1.7 - 2.4 mg/dL    Comment: Performed at [facility], [address], [place], [place] [address]  Phosphorus     Status: None   Collection Time: 07/10/19  4:05 AM  Result Value Ref Range   Phosphorus 4.1 2.5 - 4.6 mg/dL    Comment: Performed at [facility], [address] [place], [place] [address]  Protime-INR     Status: None   Collection Time: 07/10/19  4:05 AM  Result Value Ref Range   Prothrombin Time 13.3 11.4 - 15.2 seconds   INR 1.0 0.8 - 1.2    Comment: (NOTE) INR goal varies based on device and disease states. Performed at [facility], [address] [place], [place] [address]   CBC with Differential/Platelet     Status: Abnormal   Collection Time: 07/11/19  4:40 AM  Result Value Ref Range   WBC 3.1 (L) 4.0 - 10.5 K/uL   RBC 3.56 (L) 3.87 - 5.11 MIL/uL   Hemoglobin 11.5 (L) 12.0 - 15.0 g/dL    Comment: POST TRANSFUSION SPECIMEN   HCT 32.1 (L) 36.0 - 46.0 %   MCV 90.2 80.0 - 100.0 fL  MCH 32.3 26.0 - 34.0 pg   MCHC 35.8 30.0 - 36.0 g/dL   RDW 16.4 (H) 11.5 - 15.5 %   Platelets 15 (LL) 150 - 400 K/uL    Comment: Immature Platelet Fraction may be clinically indicated, consider ordering this additional test [id number] CRITICAL VALUE NOTED.  VALUE IS CONSISTENT WITH PREVIOUSLY REPORTED AND CALLED VALUE.    nRBC 6.2 (H) 0.0 - 0.2 %   Neutrophils Relative % 56 %   Neutro Abs 1.7 1.7 - 7.7 K/uL   Lymphocytes Relative 29 %   Lymphs Abs 0.9 0.7 - 4.0 K/uL   Monocytes Relative 11 %   Monocytes Absolute 0.3 0.1 - 1.0 K/uL   Eosinophils Relative 0 %   Eosinophils Absolute 0.0 0.0 - 0.5 K/uL   Basophils Relative 0 %   Basophils Absolute 0.0 0.0 - 0.1 K/uL   Smear Review PLATELETS APPEAR DECREASED     Comment:  PLATELET COUNT CONFIRMED BY SMEAR   Immature Granulocytes 4 %   Abs Immature Granulocytes 0.12 (H) 0.00 - 0.07 K/uL   Hypersegmented Neutrophils PRESENT    Schistocytes PRESENT    Polychromasia PRESENT     Comment: Performed at [facility], [address] [place], [place] [address]  Lipase, blood     Status: Abnormal   Collection Time: 07/11/19  4:40 AM  Result Value Ref Range   Lipase 85 (H) 11 - 51 U/L    Comment: Performed at [facility], [address] [place], [place] [id number]  Basic metabolic panel     Status: Abnormal   Collection Time: 07/11/19  4:40 AM  Result Value Ref Range   Sodium 139 135 - 145 mmol/L   Potassium 4.2 3.5 - 5.1 mmol/L   Chloride 114 (H) 98 - 111 mmol/L   CO2 18 (L) 22 - 32 mmol/L   Glucose, Bld 98 70 - 99 mg/dL   BUN 6 6 - 20 mg/dL   Creatinine, Ser 0.48 0.44 - 1.00 mg/dL   Calcium 8.9 8.9 - 10.3 mg/dL   GFR calc non Af Amer >60 >60 mL/min   GFR calc Af Amer >60 >60 mL/min   Anion gap 7 5 - 15    Comment: Performed at [facility], [address], [place], [place] [address]  Magnesium     Status: None   Collection Time: 07/11/19  4:40 AM  Result Value Ref Range   Magnesium 1.8 1.7 - 2.4 mg/dL    Comment: Performed at [facility], [address], [place], [place] [address]  Pathologist smear review     Status: None   Collection Time: 07/11/19  4:40 AM  Result Value Ref Range   Path Review Peripheral blood smear is reviewed.     Comment: Patient with marked thrombocytopenia, status post bone marrow yesterday for further evaluation. History of alcohol and pancreatitis. RBCs with anisocytosis, target celss, nucleated RBCs, and rouleaux. Schistocytes are present. Subset of neutrophils with hypersegmentation. Persistent marked thrombocytopenia with normal platelet morphology. New finding of schistocytes communicated to Dr. Janese Banks on [id number] at [id number]. Complicated medical history with recent labs not revealing  evidence of hemolysis.   The differential includes new onset microangiopathic hemolytic anemia, immune mediated process such as transfusion reaction, folate deficiency associated changes, etc. Reviewed by Dellia Nims. Reuel Derby, M.D. Performed at [facility], [address] [place], [place] [address]     Current Facility-Administered Medications  Medication Dose Route Frequency Provider Last Rate Last Dose  .  0.9 %  sodium chloride infusion   Intravenous PRN Nicholes Mango, MD   Stopped at 07/09/19 1311  . 0.9 %  sodium chloride infusion   Intravenous Continuous Corrie Mckusick, DO   Stopped at 07/10/19 1123  . 0.9 % NaCl with KCl 20 mEq/ L  infusion   Intravenous Continuous Gouru, Aruna, MD 125 mL/hr at 07/11/19 0405    . acetaminophen (TYLENOL) tablet 650 mg  650 mg Oral Q6H PRN Harrie Foreman, MD   650 mg at 07/09/19 0525   Or  . acetaminophen (TYLENOL) suppository 650 mg  650 mg Rectal Q6H PRN Harrie Foreman, MD   650 mg at 07/07/19 0506  . chlorpheniramine-HYDROcodone (TUSSIONEX) 10-8 MG/5ML suspension 5 mL  5 mL Oral QHS PRN Harrie Foreman, MD      . cyclobenzaprine (FLEXERIL) tablet 10 mg  10 mg Oral BID PRN Nicholes Mango, MD   10 mg at 07/09/19 1457  . dexamethasone (DECADRON) 10 MG/ML injection for Pediatric ORAL use 40 mg  40 mg Oral Daily Sindy Guadeloupe, MD   40 mg at 07/11/19 0853  . folic acid (FOLVITE) tablet 1 mg  1 mg Oral Daily Harrie Foreman, MD   1 mg at 07/11/19 0835  . gabapentin (NEURONTIN) capsule 300 mg  300 mg Oral BID PRN Nicholes Mango, MD   300 mg at 07/09/19 2248  . HYDROmorphone (DILAUDID) injection 0.5 mg  0.5 mg Intravenous Q3H PRN Harrie Foreman, MD   0.5 mg at 07/11/19 [id number]  . iohexol (OMNIPAQUE) 9 MG/ML oral solution 500 mL  500 mL Oral BID PRN Harrie Foreman, MD   500 mL at 07/06/19 1745  . iohexol (OMNIPAQUE) 9 MG/ML oral solution 500 mL  500 mL Oral Once PRN Sindy Guadeloupe, MD   500 mL at 07/09/19 0859  . levETIRAcetam (KEPPRA) IVPB  500 mg/100 mL premix  500 mg Intravenous Q12H Alexis Goodell, MD   Stopped at 07/10/19 2245  . LORazepam (ATIVAN) injection 1 mg  1 mg Intravenous Q4H PRN Gouru, Aruna, MD   1 mg at 07/07/19 0932  . LORazepam (ATIVAN) injection 1 mg  1 mg Intravenous Once Gouru, Aruna, MD      . metoCLOPramide (REGLAN) injection 5 mg  5 mg Intravenous Q6H PRN Lance Coon, MD   5 mg at 07/11/19 0655  . metoprolol tartrate (LOPRESSOR) injection 5 mg  5 mg Intravenous Q4H PRN Gouru, Aruna, MD   5 mg at 07/09/19 0554  . metoprolol tartrate (LOPRESSOR) tablet 50 mg  50 mg Oral BID Nicholes Mango, MD   50 mg at 07/11/19 0835  . morphine 2 MG/ML injection 2 mg  2 mg Intravenous Q4H PRN Harrie Foreman, MD   2 mg at 07/11/19 0653  . multivitamin with minerals tablet 1 tablet  1 tablet Oral Daily Harrie Foreman, MD   1 tablet at 07/11/19 0835  . naloxone [facility]) nasal spray 4 mg/0.1 mL  1 spray Nasal Once Nicholes Mango, MD   Stopped at 07/07/19 0830  . nitroGLYCERIN (NITROGLYN) 2 % ointment 0.5 inch  0.5 inch Topical Q6H Harrie Foreman, MD   0.5 inch at 07/11/19 0548  . nitroGLYCERIN (NITROSTAT) SL tablet 0.4 mg  0.4 mg Sublingual Q5 min PRN Gouru, Aruna, MD   0.4 mg at 07/07/19 0815  . ondansetron (ZOFRAN) tablet 4 mg  4 mg Oral Q6H PRN Harrie Foreman, MD       Or  .  ondansetron (ZOFRAN) injection 4 mg  4 mg Intravenous Q6H PRN Harrie Foreman, MD   4 mg at 07/08/19 0204  . pantoprazole (PROTONIX) EC tablet 20 mg  20 mg Oral Daily Sindy Guadeloupe, MD   20 mg at 07/11/19 0835  . prochlorperazine (COMPAZINE) injection 10 mg  10 mg Intravenous Q6H PRN Harrie Foreman, MD   10 mg at 07/09/19 0202  . QUEtiapine (SEROQUEL) tablet 300 mg  300 mg Oral BID Money, Lowry Ram, FNP   300 mg at 07/11/19 0835  . sodium chloride flush (NS) 0.9 % injection 10-40 mL  10-40 mL Intracatheter Q12H Gouru, Aruna, MD   10 mL at 07/11/19 0837  . sodium chloride flush (NS) 0.9 % injection 10-40 mL  10-40 mL Intracatheter PRN  Gouru, Aruna, MD      . thiamine (VITAMIN B-1) tablet 100 mg  100 mg Oral Daily Harrie Foreman, MD   100 mg at 07/11/19 [id number]   Or  . thiamine (B-1) injection 100 mg  100 mg Intravenous Daily Harrie Foreman, MD   100 mg at 07/08/19 [id number]    Musculoskeletal: Strength & Muscle Tone: within normal limits Gait & Station: Patient remained in bed during evaluation Patient leans: N/A  Psychiatric Specialty Exam: Physical Exam  Nursing note and vitals reviewed. Constitutional: She appears well-developed and well-nourished.  HENT:  Head: Normocephalic.  Respiratory: Effort normal.  Musculoskeletal: Normal range of motion.  Neurological: She is alert.  Psychiatric: Her speech is normal and behavior is normal. Her mood appears anxious. Thought content is delusional. Cognition and memory are impaired. She expresses inappropriate judgment.    Review of Systems  Constitutional: Negative.   HENT: Negative.   Eyes: Negative.   Respiratory: Negative.   Cardiovascular: Negative.   Gastrointestinal: Negative.   Genitourinary: Negative.   Musculoskeletal: Positive for back pain.  Skin: Negative.   Neurological: Negative.   Endo/Heme/Allergies: Negative.   Psychiatric/Behavioral: Positive for memory loss. The patient is nervous/anxious.     Blood pressure (!) 156/102, pulse 97, temperature 97.8 F (36.6 C), temperature source Oral, resp. rate 18, height 5\' 4"  (1.626 m), weight 74.2 kg, last menstrual period 07/05/2019, SpO2 99 %.Body mass index is 28.08 kg/m.  General Appearance: Disheveled  Eye Contact:  Minimal  Speech:  Regular  Volume:  Decreased  Mood:  Anxious  Affect:  Flat  Thought Process:  Disorganized and Descriptions of Associations: Loose  Orientation:  Other:  self  Thought Content:  Delusions and Hallucinations: Auditory Visual  Suicidal Thoughts:  No  Homicidal Thoughts:  No  Memory:  Fair  Judgement:  Impaired  Insight:  Lacking  Psychomotor Activity:   Decreased  Concentration:  Concentration: Poor  Recall:  Poor  Fund of Knowledge:  Fair  Language:  Poor  Akathisia:  No  Handed:  Right  AIMS (if indicated):     Assets:  Financial Resources/Insurance Housing Social Support  ADL's:  Impaired  Cognition:  Impaired,  Moderate  Sleep:        Treatment Plan Summary: Patient is confused and does not currently have capacity to make medical decisions  Bipolar affective disorder: Continue Seroquel 300 mg PO BID -Initiated IVC paperwork   Disposition: No evidence of imminent risk to self or others at present.   Patient does not meet criteria for psychiatric inpatient admission.  Waylan Boga, NP 07/11/2019 10:36 AM
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
## 2019-07-11 NOTE — Consult Note (Signed)
[facility] for Electrolyte Monitoring and Replacement   Recent Labs: Potassium (mmol/L)  Date Value  07/11/2019 4.2  07/16/2014 3.6   Magnesium (mg/dL)  Date Value  07/11/2019 1.8  11/22/2013 1.5 (L)   Calcium (mg/dL)  Date Value  07/11/2019 8.9   Calcium, Total (mg/dL)  Date Value  07/16/2014 8.9   Albumin (g/dL)  Date Value  07/09/2019 2.7 (L)  07/16/2014 3.4   Phosphorus (mg/dL)  Date Value  07/10/2019 4.1  02/03/2013 1.7 (L)   Sodium (mmol/L)  Date Value  07/11/2019 139  07/16/2014 145  Corrected Ca: 9 .94  Assessment: 43 year old female with electrolyte abnormality. Pharmacy consulted to manage electrolytes.  Goal of Therapy:  Electrolytes wnl's  Plan:  9/10 K 4.2  Mag 1.8  Scr 0.48 Currently on NS w/ KCL 20 meq/L (rate of 125 ml/hr).  Will order Magnesium 2 g x1.   Electrolytes with am labs.  Rowland Lathe ,PharmD Clinical Pharmacist 07/11/2019 7:30 AM

## 2019-07-11 NOTE — Plan of Care (Signed)
Ativan order expired used to tx CIWA.  Spoke to pharmacy and it expires after 4 doses.  Pt still experiencing symptoms.  Called prime doc to renew order.  Current CIWA score is 11.

## 2019-07-11 NOTE — Progress Notes (Signed)
I was on conference call with Dr. Margaretmary Eddy, [facility] hospitalist and pheresis attending. I have explained patients case to them. Pheresis attending at [facility] in agreement with getting the patient there for possible pheresis pending bed availability. Transfer center will get back with Dr. Margaretmary Eddy regarding this.  If patient does not get transferred to [facility] overnight, ok to give FFP as a temporizing measure while she is here. ADAMTS levels should be back by tomorrow  Dr. Randa Evens, MD, MPH [facility] at [facility] [phone] 07/11/2019 2:43 PM

## 2019-07-12 DIAGNOSIS — D649 Anemia, unspecified: Secondary | ICD-10-CM

## 2019-07-12 DIAGNOSIS — F3131 Bipolar disorder, current episode depressed, mild: Secondary | ICD-10-CM

## 2019-07-12 LAB — HEPATIC FUNCTION PANEL
ALT: 57 U/L — ABNORMAL HIGH (ref 0–44)
AST: 65 U/L — ABNORMAL HIGH (ref 15–41)
Albumin: 3.1 g/dL — ABNORMAL LOW (ref 3.5–5.0)
Alkaline Phosphatase: 49 U/L (ref 38–126)
Bilirubin, Direct: 0.2 mg/dL (ref 0.0–0.2)
Indirect Bilirubin: 0.5 mg/dL (ref 0.3–0.9)
Total Bilirubin: 0.7 mg/dL (ref 0.3–1.2)
Total Protein: 7 g/dL (ref 6.5–8.1)

## 2019-07-12 LAB — BASIC METABOLIC PANEL
Anion gap: 11 (ref 5–15)
BUN: 6 mg/dL (ref 6–20)
CO2: 22 mmol/L (ref 22–32)
Calcium: 9.1 mg/dL (ref 8.9–10.3)
Chloride: 108 mmol/L (ref 98–111)
Creatinine, Ser: 0.58 mg/dL (ref 0.44–1.00)
GFR calc Af Amer: >60 mL/min (ref >60)
GFR calc non Af Amer: >60 mL/min (ref >60)
Glucose, Bld: 94 mg/dL (ref 70–99)
Potassium: 3 mmol/L — ABNORMAL LOW (ref 3.5–5.1)
Sodium: 141 mmol/L (ref 135–145)

## 2019-07-12 LAB — LIPASE, BLOOD: Lipase: 178 U/L — ABNORMAL HIGH (ref 11–51)

## 2019-07-12 LAB — CBC WITH DIFFERENTIAL/PLATELET
Abs Immature Granulocytes: 0.44 10*3/uL — ABNORMAL HIGH (ref 0.00–0.07)
Basophils Absolute: 0 10*3/uL (ref 0.0–0.1)
Basophils Relative: 0 %
Eosinophils Absolute: 0 10*3/uL (ref 0.0–0.5)
Eosinophils Relative: 0 %
HCT: 24.4 % — ABNORMAL LOW (ref 36.0–46.0)
Hemoglobin: 8.4 g/dL — ABNORMAL LOW (ref 12.0–15.0)
Immature Granulocytes: 5 %
Lymphocytes Relative: 31 %
Lymphs Abs: 2.6 10*3/uL (ref 0.7–4.0)
MCH: 32.1 pg (ref 26.0–34.0)
MCHC: 34.4 g/dL (ref 30.0–36.0)
MCV: 93.1 fL (ref 80.0–100.0)
Monocytes Absolute: 0.8 10*3/uL (ref 0.1–1.0)
Monocytes Relative: 10 %
Neutro Abs: 4.5 10*3/uL (ref 1.7–7.7)
Neutrophils Relative %: 54 %
Platelets: 52 10*3/uL — ABNORMAL LOW (ref 150–400)
RBC: 2.62 MIL/uL — ABNORMAL LOW (ref 3.87–5.11)
RDW: 17.3 % — ABNORMAL HIGH (ref 11.5–15.5)
Smear Review: DECREASED
WBC: 8.3 10*3/uL (ref 4.0–10.5)
nRBC: 1.4 % — ABNORMAL HIGH (ref 0.0–0.2)

## 2019-07-12 LAB — URINE DRUG SCREEN, QUALITATIVE (ARMC ONLY)
Amphetamines, Ur Screen: NOT DETECTED
Barbiturates, Ur Screen: NOT DETECTED
Benzodiazepine, Ur Scrn: POSITIVE — AB
Cannabinoid 50 Ng, Ur ~~LOC~~: NOT DETECTED
Cocaine Metabolite,Ur ~~LOC~~: NOT DETECTED
MDMA (Ecstasy)Ur Screen: NOT DETECTED
Methadone Scn, Ur: NOT DETECTED
Opiate, Ur Screen: POSITIVE — AB
Phencyclidine (PCP) Ur S: NOT DETECTED
Tricyclic, Ur Screen: POSITIVE — AB

## 2019-07-12 LAB — RETICULOCYTES
Immature Retic Fract: 46.7 % — ABNORMAL HIGH (ref 2.3–15.9)
RBC.: 2.62 MIL/uL — ABNORMAL LOW (ref 3.87–5.11)
Retic Count, Absolute: 137 10*3/uL (ref 19.0–186.0)
Retic Ct Pct: 5.2 % — ABNORMAL HIGH (ref 0.4–3.1)

## 2019-07-12 LAB — PREPARE FRESH FROZEN PLASMA: Unit division: 0

## 2019-07-12 LAB — PHOSPHORUS: Phosphorus: 4.5 mg/dL (ref 2.5–4.6)

## 2019-07-12 LAB — BPAM FFP
Blood Product Expiration Date: 202009162359
ISSUE DATE / TIME: 202009111722
Unit Type and Rh: 5100

## 2019-07-12 LAB — LACTATE DEHYDROGENASE: LDH: 348 U/L — ABNORMAL HIGH (ref 98–192)

## 2019-07-12 LAB — MAGNESIUM: Magnesium: 1.9 mg/dL (ref 1.7–2.4)

## 2019-07-12 LAB — ADAMTS13 ACTIVITY: Adamts 13 Activity: 41 % — ABNORMAL LOW (ref >66.8)

## 2019-07-12 LAB — FIBRIN DERIVATIVES D-DIMER (ARMC ONLY): Fibrin derivatives D-dimer (ARMC): 1364.95 ng/mL (FEU) — ABNORMAL HIGH (ref 0.00–499.00)

## 2019-07-12 LAB — ADAMTS13 ACTIVITY REFLEX

## 2019-07-12 MED ORDER — HYDROCODONE-ACETAMINOPHEN 5-325 MG PO TABS
1.0000 | ORAL_TABLET | ORAL | Status: DC | PRN
  Filled 2019-07-12: qty 2

## 2019-07-12 MED ORDER — MORPHINE SULFATE (PF) 2 MG/ML IV SOLN
INTRAVENOUS | Status: AC
  Administered 2019-07-12: 08:00:00 2 mg via INTRAVENOUS
  Filled 2019-07-12: qty 1

## 2019-07-12 MED ORDER — POTASSIUM CHLORIDE CRYS ER 20 MEQ PO TBCR
40.0000 meq | EXTENDED_RELEASE_TABLET | Freq: Once | ORAL | Status: AC
  Administered 2019-07-12: 19:00:00 40 meq via ORAL
  Filled 2019-07-12: qty 2

## 2019-07-12 MED ORDER — OXYCODONE-ACETAMINOPHEN 5-325 MG PO TABS
1.0000 | ORAL_TABLET | ORAL | Status: DC | PRN
  Administered 2019-07-12 – 2019-07-13 (×3): 1 via ORAL
  Filled 2019-07-12 (×3): qty 1

## 2019-07-12 MED ORDER — POTASSIUM CHLORIDE CRYS ER 20 MEQ PO TBCR
40.0000 meq | EXTENDED_RELEASE_TABLET | Freq: Once | ORAL | Status: AC
  Administered 2019-07-12: 12:00:00 40 meq via ORAL
  Filled 2019-07-12: qty 2

## 2019-07-12 MED ORDER — MORPHINE SULFATE (PF) 4 MG/ML IV SOLN
INTRAVENOUS | Status: AC
  Administered 2019-07-12: 2 mg
  Filled 2019-07-12: qty 1

## 2019-07-12 MED ORDER — LORAZEPAM 2 MG/ML IJ SOLN
0.0000 mg | Freq: Four times a day (QID) | INTRAMUSCULAR | Status: DC | PRN
  Administered 2019-07-12: 4 mg via INTRAVENOUS
  Administered 2019-07-12: 15:00:00 2 mg via INTRAVENOUS
  Administered 2019-07-13: 1 mg via INTRAVENOUS
  Filled 2019-07-12: qty 2
  Filled 2019-07-12 (×2): qty 1

## 2019-07-12 MED ORDER — HYDROCODONE-ACETAMINOPHEN 5-325 MG PO TABS: 1.0000 | ORAL_TABLET | ORAL | Status: DC | PRN

## 2019-07-12 MED ORDER — MORPHINE SULFATE (PF) 2 MG/ML IV SOLN
2.0000 mg | INTRAVENOUS | Status: DC | PRN
  Administered 2019-07-12 – 2019-07-13 (×3): 2 mg via INTRAVENOUS
  Filled 2019-07-12 (×4): qty 1

## 2019-07-12 MED ORDER — MAGNESIUM SULFATE IN D5W 1-5 GM/100ML-% IV SOLN
1.0000 g | Freq: Once | INTRAVENOUS | Status: AC
  Administered 2019-07-12: 1 g via INTRAVENOUS
  Filled 2019-07-12: qty 100

## 2019-07-12 MED ORDER — NALOXONE HCL 0.4 MG/ML IJ SOLN
0.4000 mg | Freq: Once | INTRAMUSCULAR | Status: AC
  Administered 2019-07-12: 20:00:00 0.4 mg via INTRAVENOUS
  Filled 2019-07-12: qty 1

## 2019-07-12 NOTE — Progress Notes (Signed)
Pt refused her Labs this morning. She had also refused her IV fluids.. Morphine and Dilaudid PRN for pain with little relief. Sitter at bedside. Awaiting for bed at [facility]. No seizures nor bleeding noted this shift. Continues on ciwar protocol. Continue to monitor.

## 2019-07-12 NOTE — Progress Notes (Addendum)
Patient ID: Sarah Serrano, female   DOB: 05/20/1976, 43 y.o.   MRN: [id number] Spoke with michael at length and updated.   ADMts is pending. Not heard back form [facility] regarding any beds. Per Heme-onc pt's probability of TTP is low . She is not actively hemolyzing plt count 52 K, LDH 348  Since her pain is more and Lipase up again I will keep her on CLD

## 2019-07-12 NOTE — Consult Note (Addendum)
Sarah Serrano Psychiatry Consult   Reason for Consult:  IVC Referring Physician:  EDP Patient Identification: Sarah Serrano MRN:  [id number] Principal Diagnosis: Pancreatitis Diagnosis:  Principal Problem:   Pancreatitis Active Problems:   Bipolar disorder ([place])   Severe thrombocytopenia ([place])   Anemia   Pancytopenia ([place])   Encounter for competency evaluation  Total Time spent with patient: 30 minutes  Subjective:   Sarah Serrano is a 43 y.o. female patient is clearer today and less disorganized.  Her RN is explaining things to her.  She reports she just wants to know "why" people are doing what they are to her.  She is "fine" once they explain things to her.    07/12/19: Patient seen and evaluated by this provider in person.  She was awake, alert, and sitting up in bed.  Her nurse was explaining why she was needing to transfer and how she would still be taken care of.  She felt better "when people communicate to me."  No suicidal/homicidal ideations, hallucinations, or withdrawal symptoms.  She is compliant with her medications and is less disorganized today, processing information better.  07/11/19: Patient seen and evaluated by this provider in person.  She was asleep but aroused when awakened.  Kept her eyes closed as she spoke.  Discussed needing to leave to see her mother without any regard for the need to address her medical needs.  She started out sounding logical and became disorganized quickly in conversation.  Appeared tired and drowsy.  Denies suicidal/homicidal ideations, hallucinations but clearly not at her baseline without medical capacity at this time nor a few days ago when she was assessed for capacity.  IVC will be placed to prevent her from leaving the hospital and potentially dying from her medical issues.  HPI:  Per Dr. Cinda Quest: 43 y.o.femalewho reports epigastric pain starting last night with a lot of vomiting. This pain radiates to her back. She gets  very sweaty when she vomits. Nurse reported she was soaking wet when she began vomiting in the emergency room. Pain comes up into the chest somewhat. Makes her short of breath. It hurts a little bit when she breathes as well. Pain is moderately severe deep and achy. She is not complaining of fever. No blood in the vomit. No diarrhea. The pain makes her short of breath.  07/09/19:  Patient is also diagnosed with severe thrombocytopenia and suspected bone marrow suppression.  Patient was seen by me face-to-face.  I was requested to assess the capacity of this patient for medical decision-making.  I assessed the patient yesterday and she was completely alert and oriented.  She appeared to be slightly irritated due to the questioning that she received but answered all questions appropriately.  Today the patient has disorganized thought, memory loss, confusion, and it is been reported that the patient has been talking to herself when she is in the room alone.  Nursing staff reported that at 1 time she got out of bed and went looking for someone that she stated had been in her room and there was no one in there.  I was also contacted by secure chat due to the patient's thrombocytopenia and that the possibility of her sertraline can worsen thrombocytopenia.  Sertraline was discontinued at this time but recommended to continue the Seroquel at 300 mg p.o. twice daily.  At this time the patient does not meet criteria for capacity and if needed due to concerns for patient's confusion and her medical condition  she can be IVC'd if needed  Past Psychiatric History: Bipolar disorder  Risk to Self:   Risk to Others:   Prior Inpatient Therapy:   Prior Outpatient Therapy:    Past Medical History:  Past Medical History:  Diagnosis Date  . Alcohol abuse   . Bipolar 1 disorder ([place])   . Substance abuse [facility])     Past Surgical History:  Procedure Laterality Date  . NO PAST SURGERIES     Family History:   Family History  Problem Relation Age of Onset  . Bipolar disorder Mother    Family Psychiatric  History: See above Social History:  Social History   Substance and Sexual Activity  Alcohol Use Yes     Social History   Substance and Sexual Activity  Drug Use Yes    Social History   Socioeconomic History  . Marital status: Single    Spouse name: Not on file  . Number of children: Not on file  . Years of education: Not on file  . Highest education level: Not on file  Occupational History  . Not on file  Social Needs  . Financial resource strain: Not on file  . Food insecurity    Worry: Not on file    Inability: Not on file  . Transportation needs    Medical: Not on file    Non-medical: Not on file  Tobacco Use  . Smoking status: Current Every Day Smoker    Packs/day: 0.50    Types: Cigarettes  . Smokeless tobacco: Never Used  Substance and Sexual Activity  . Alcohol use: Yes  . Drug use: Yes  . Sexual activity: Not on file  Lifestyle  . Physical activity    Days per week: Not on file    Minutes per session: Not on file  . Stress: Not on file  Relationships  . Social Herbalist on phone: Not on file    Gets together: Not on file    Attends religious service: Not on file    Active member of club or organization: Not on file    Attends meetings of clubs or organizations: Not on file    Relationship status: Not on file  Other Topics Concern  . Not on file  Social History Narrative  . Not on file   Additional Social History:    Allergies:  No Known Allergies  Labs:  Results for orders placed or performed during the hospital encounter of 07/06/19 (from the past 48 hour(s))  CBC with Differential/Platelet     Status: Abnormal   Collection Time: 07/11/19  4:40 AM  Result Value Ref Range   WBC 3.1 (L) 4.0 - 10.5 K/uL   RBC 3.56 (L) 3.87 - 5.11 MIL/uL   Hemoglobin 11.5 (L) 12.0 - 15.0 g/dL    Comment: POST TRANSFUSION SPECIMEN   HCT 32.1 (L)  36.0 - 46.0 %   MCV 90.2 80.0 - 100.0 fL   MCH 32.3 26.0 - 34.0 pg   MCHC 35.8 30.0 - 36.0 g/dL   RDW 16.4 (H) 11.5 - 15.5 %   Platelets 15 (LL) 150 - 400 K/uL    Comment: Immature Platelet Fraction may be clinically indicated, consider ordering this additional test [id number] CRITICAL VALUE NOTED.  VALUE IS CONSISTENT WITH PREVIOUSLY REPORTED AND CALLED VALUE.    nRBC 6.2 (H) 0.0 - 0.2 %   Neutrophils Relative % 56 %   Neutro Abs 1.7 1.7 - 7.7 K/uL  Lymphocytes Relative 29 %   Lymphs Abs 0.9 0.7 - 4.0 K/uL   Monocytes Relative 11 %   Monocytes Absolute 0.3 0.1 - 1.0 K/uL   Eosinophils Relative 0 %   Eosinophils Absolute 0.0 0.0 - 0.5 K/uL   Basophils Relative 0 %   Basophils Absolute 0.0 0.0 - 0.1 K/uL   Smear Review PLATELETS APPEAR DECREASED     Comment: PLATELET COUNT CONFIRMED BY SMEAR   Immature Granulocytes 4 %   Abs Immature Granulocytes 0.12 (H) 0.00 - 0.07 K/uL   Hypersegmented Neutrophils PRESENT    Schistocytes PRESENT    Polychromasia PRESENT     Comment: Performed at [facility], [address] [place], [place] [address]  Lipase, blood     Status: Abnormal   Collection Time: 07/11/19  4:40 AM  Result Value Ref Range   Lipase 85 (H) 11 - 51 U/L    Comment: Performed at [facility], [address] [place], [place] [id number]  Basic metabolic panel     Status: Abnormal   Collection Time: 07/11/19  4:40 AM  Result Value Ref Range   Sodium 139 135 - 145 mmol/L   Potassium 4.2 3.5 - 5.1 mmol/L   Chloride 114 (H) 98 - 111 mmol/L   CO2 18 (L) 22 - 32 mmol/L   Glucose, Bld 98 70 - 99 mg/dL   BUN 6 6 - 20 mg/dL   Creatinine, Ser 0.48 0.44 - 1.00 mg/dL   Calcium 8.9 8.9 - 10.3 mg/dL   GFR calc non Af Amer >60 >60 mL/min   GFR calc Af Amer >60 >60 mL/min   Anion gap 7 5 - 15    Comment: Performed at [facility], [address] [place], [place] [address]  Magnesium     Status: None   Collection Time: 07/11/19  4:40 AM  Result  Value Ref Range   Magnesium 1.8 1.7 - 2.4 mg/dL    Comment: Performed at [facility], [address], [place], [place] [address]  Pathologist smear review     Status: None   Collection Time: 07/11/19  4:40 AM  Result Value Ref Range   Path Review Peripheral blood smear is reviewed.     Comment: Patient with marked thrombocytopenia, status post bone marrow yesterday for further evaluation. History of alcohol and pancreatitis. RBCs with anisocytosis, target celss, nucleated RBCs, and rouleaux. Schistocytes are present. Subset of neutrophils with hypersegmentation. Persistent marked thrombocytopenia with normal platelet morphology. New finding of schistocytes communicated to Dr. Janese Banks on [id number] at [id number]. Complicated medical history with recent labs not revealing evidence of hemolysis.   The differential includes new onset microangiopathic hemolytic anemia, immune mediated process such as transfusion reaction, folate deficiency associated changes, etc. Reviewed by Dellia Nims. Reuel Derby, M.D. Performed at [facility], [address] [place], [place] [address]   Lactate dehydrogenase     Status: Abnormal   Collection Time: 07/11/19 10:46 AM  Result Value Ref Range   LDH 339 (H) 98 - 192 U/L    Comment: Performed at [facility], [address] [place],  [address]  Reticulocytes     Status: Abnormal   Collection Time: 07/11/19 10:46 AM  Result Value Ref Range   Retic Ct Pct 2.0 0.4 - 3.1 %   RBC. 2.65 (L) 3.87 - 5.11 MIL/uL   Retic Count, Absolute 52.5 19.0 - 186.0 K/uL   Immature Retic Fract 52.0 (H) 2.3 - 15.9 %  Comment: Performed at [facility], [address] [place], [place] [address]  Protime-INR     Status: None   Collection Time: 07/11/19 10:46 AM  Result Value Ref Range   Prothrombin Time 13.2 11.4 - 15.2 seconds   INR 1.0 0.8 - 1.2    Comment: (NOTE) INR goal varies based on device and disease states. Performed at [facility], [address] [place], [place] [address]   APTT     Status: None   Collection Time: 07/11/19 10:46 AM  Result Value Ref Range   aPTT 28 24 - 36 seconds    Comment: Performed at [facility], [address] [place], [place] [address]  Comprehensive metabolic panel     Status: Abnormal   Collection Time: 07/11/19 10:46 AM  Result Value Ref Range   Sodium 139 135 - 145 mmol/L   Potassium 3.6 3.5 - 5.1 mmol/L   Chloride 112 (H) 98 - 111 mmol/L   CO2 19 (L) 22 - 32 mmol/L   Glucose, Bld 118 (H) 70 - 99 mg/dL   BUN 6 6 - 20 mg/dL   Creatinine, Ser 0.50 0.44 - 1.00 mg/dL   Calcium 8.9 8.9 - 10.3 mg/dL   Total Protein 6.9 6.5 - 8.1 g/dL   Albumin 2.9 (L) 3.5 - 5.0 g/dL   AST 43 (H) 15 - 41 U/L   ALT 29 0 - 44 U/L   Alkaline Phosphatase 52 38 - 126 U/L   Total Bilirubin 0.8 0.3 - 1.2 mg/dL   GFR calc non Af Amer >60 >60 mL/min   GFR calc Af Amer >60 >60 mL/min   Anion gap 8 5 - 15    Comment: Performed at [facility], [address] [place], [place] [address]  Prepare fresh frozen plasma     Status: None   Collection Time: 07/11/19  3:01 PM  Result Value Ref Range   Unit Number [id number]    Blood Component Type THAWED PLASMA    Unit division 00    Status of Unit ISSUED,FINAL    Transfusion Status      OK TO TRANSFUSE Performed at [facility], [address] [place], [place] [address]   CBC with Differential/Platelet     Status: Abnormal   Collection Time: 07/12/19  8:15 AM  Result Value Ref Range   WBC 8.3 4.0 - 10.5 K/uL   RBC 2.62 (L) 3.87 - 5.11 MIL/uL   Hemoglobin 8.4 (L) 12.0 - 15.0 g/dL    Comment: REPEATED TO VERIFY   HCT 24.4 (L) 36.0 - 46.0 %   MCV 93.1 80.0 - 100.0 fL   MCH 32.1 26.0 - 34.0 pg   MCHC 34.4 30.0 - 36.0 g/dL   RDW 17.3 (H) 11.5 - 15.5 %   Platelets 52 (L) 150 - 400 K/uL    Comment: Immature Platelet Fraction may be clinically indicated, consider ordering this additional test [id number]     nRBC 1.4 (H) 0.0 - 0.2 %   Neutrophils Relative % 54 %   Neutro Abs 4.5 1.7 - 7.7 K/uL   Lymphocytes Relative 31 %   Lymphs Abs 2.6 0.7 - 4.0 K/uL   Monocytes Relative 10 %   Monocytes Absolute 0.8 0.1 - 1.0 K/uL   Eosinophils Relative 0 %   Eosinophils Absolute 0.0 0.0 - 0.5 K/uL   Basophils Relative 0 %   Basophils Absolute 0.0 0.0 - 0.1 K/uL   WBC Morphology MORPHOLOGY  UNREMARKABLE    Smear Review PLATELETS APPEAR DECREASED    Immature Granulocytes 5 %   Abs Immature Granulocytes 0.44 (H) 0.00 - 0.07 K/uL   Schistocytes PRESENT    Tammy Sours Bodies PRESENT    Polychromasia PRESENT    Target Cells PRESENT     Comment: Performed at [facility], [address] [place], [place] [address]  Lipase, blood     Status: Abnormal   Collection Time: 07/12/19  8:15 AM  Result Value Ref Range   Lipase 178 (H) 11 - 51 U/L    Comment: Performed at [facility], [address] [place], [place] [id number]  Basic metabolic panel     Status: Abnormal   Collection Time: 07/12/19  8:15 AM  Result Value Ref Range   Sodium 141 135 - 145 mmol/L   Potassium 3.0 (L) 3.5 - 5.1 mmol/L   Chloride 108 98 - 111 mmol/L   CO2 22 22 - 32 mmol/L   Glucose, Bld 94 70 - 99 mg/dL   BUN 6 6 - 20 mg/dL   Creatinine, Ser 0.58 0.44 - 1.00 mg/dL   Calcium 9.1 8.9 - 10.3 mg/dL   GFR calc non Af Amer >60 >60 mL/min   GFR calc Af Amer >60 >60 mL/min   Anion gap 11 5 - 15    Comment: Performed at [facility], [address] [place], [place] [address]  Magnesium     Status: None   Collection Time: 07/12/19  8:15 AM  Result Value Ref Range   Magnesium 1.9 1.7 - 2.4 mg/dL    Comment: Performed at [facility], [address] [place], [place] [address]  Reticulocytes     Status: Abnormal   Collection Time: 07/12/19  8:15 AM  Result Value Ref Range   Retic Ct Pct 5.2 (H) 0.4 - 3.1 %   RBC. 2.62 (L) 3.87 - 5.11 MIL/uL   Retic Count, Absolute 137.0 19.0 - 186.0 K/uL    Immature Retic Fract 46.7 (H) 2.3 - 15.9 %    Comment: Performed at [facility], [address] [place], [place] [address]  Lactate dehydrogenase     Status: Abnormal   Collection Time: 07/12/19  8:15 AM  Result Value Ref Range   LDH 348 (H) 98 - 192 U/L    Comment: Performed at [facility], [address] [place], [place] [address]  Hepatic function panel     Status: Abnormal   Collection Time: 07/12/19  8:15 AM  Result Value Ref Range   Total Protein 7.0 6.5 - 8.1 g/dL   Albumin 3.1 (L) 3.5 - 5.0 g/dL   AST 65 (H) 15 - 41 U/L   ALT 57 (H) 0 - 44 U/L   Alkaline Phosphatase 49 38 - 126 U/L   Total Bilirubin 0.7 0.3 - 1.2 mg/dL   Bilirubin, Direct 0.2 0.0 - 0.2 mg/dL   Indirect Bilirubin 0.5 0.3 - 0.9 mg/dL    Comment: Performed at [facility], [address] [place], [place] [address]  Fibrin derivatives D-Dimer [facility] only)     Status: Abnormal   Collection Time: 07/12/19  8:15 AM  Result Value Ref Range   Fibrin derivatives D-dimer ([facility]) 1,364.95 (H) 0.00 - 499.00 ng/mL (FEU)    Comment: (NOTE) <> Exclusion of Venous Thromboembolism (VTE) - OUTPATIENT ONLY   (Emergency Department or [place])   0-499 ng/ml (FEU): With a low to intermediate pretest probability  for VTE this test result excludes the diagnosis                      of VTE.   >499 ng/ml (FEU) : VTE not excluded; additional work up for VTE is                      required. <> Testing on Inpatients and Evaluation of Disseminated Intravascular   Coagulation (DIC) Reference Range:   0-499 ng/ml (FEU) Performed at [facility], [address] [place], [place] [address]   Phosphorus     Status: None   Collection Time: 07/12/19  8:15 AM  Result Value Ref Range   Phosphorus 4.5 2.5 - 4.6 mg/dL    Comment: Performed at [facility], [address] [place],  [address]    Current Facility-Administered Medications  Medication Dose Route  Frequency Provider Last Rate Last Dose  . 0.9 %  sodium chloride infusion   Intravenous PRN Nicholes Mango, MD   Stopped at 07/09/19 1311  . 0.9 % NaCl with KCl 20 mEq/ L  infusion   Intravenous Continuous Nicholes Mango, MD   Stopped at 07/11/19 1211  . acetaminophen (TYLENOL) tablet 650 mg  650 mg Oral Q6H PRN Harrie Foreman, MD   650 mg at 07/09/19 0525   Or  . acetaminophen (TYLENOL) suppository 650 mg  650 mg Rectal Q6H PRN Harrie Foreman, MD   650 mg at 07/07/19 0506  . chlorpheniramine-HYDROcodone (TUSSIONEX) 10-8 MG/5ML suspension 5 mL  5 mL Oral QHS PRN Harrie Foreman, MD      . cyclobenzaprine (FLEXERIL) tablet 10 mg  10 mg Oral BID PRN Nicholes Mango, MD   10 mg at 07/09/19 1457  . dexamethasone (DECADRON) 10 MG/ML injection for Pediatric ORAL use 40 mg  40 mg Oral Daily Sindy Guadeloupe, MD   40 mg at 07/12/19 1142  . folic acid (FOLVITE) tablet 1 mg  1 mg Oral Daily Harrie Foreman, MD   1 mg at 07/12/19 1139  . gabapentin (NEURONTIN) capsule 300 mg  300 mg Oral BID PRN Nicholes Mango, MD   300 mg at 07/09/19 2248  . HYDROcodone-acetaminophen (NORCO/VICODIN) 5-325 MG per tablet 1-2 tablet  1-2 tablet Oral Q4H PRN Fritzi Mandes, MD      . iohexol (OMNIPAQUE) 9 MG/ML oral solution 500 mL  500 mL Oral BID PRN Harrie Foreman, MD   500 mL at 07/06/19 1745  . iohexol (OMNIPAQUE) 9 MG/ML oral solution 500 mL  500 mL Oral Once PRN Sindy Guadeloupe, MD   500 mL at 07/09/19 0859  . levETIRAcetam (KEPPRA) IVPB 500 mg/100 mL premix  500 mg Intravenous Q12H Alexis Goodell, MD 400 mL/hr at 07/12/19 1221 500 mg at 07/12/19 1221  . LORazepam (ATIVAN) injection 0-4 mg  0-4 mg Intravenous Q6H PRN Fritzi Mandes, MD   4 mg at 07/12/19 0835  . LORazepam (ATIVAN) injection 1 mg  1 mg Intravenous Q4H PRN Gouru, Aruna, MD   1 mg at 07/07/19 0932  . magnesium sulfate IVPB 1 g 100 mL  1 g Intravenous Once Charlett Nose, RPH 100 mL/hr at 07/12/19 1251 1 g at 07/12/19 1251  . metoCLOPramide (REGLAN)  injection 5 mg  5 mg Intravenous Q6H PRN Lance Coon, MD   5 mg at 07/12/19 0817  . metoprolol tartrate (LOPRESSOR) injection 5 mg  5 mg Intravenous Q4H PRN Gouru, Aruna,  MD   5 mg at 07/09/19 0554  . metoprolol tartrate (LOPRESSOR) tablet 50 mg  50 mg Oral BID Gouru, Aruna, MD   50 mg at 07/12/19 1139  . morphine 2 MG/ML injection 2 mg  2 mg Intravenous Q4H PRN Fritzi Mandes, MD   2 mg at 07/12/19 1209  . multivitamin with minerals tablet 1 tablet  1 tablet Oral Daily Harrie Foreman, MD   1 tablet at 07/12/19 1138  . naloxone [facility]) nasal spray 4 mg/0.1 mL  1 spray Nasal Once Nicholes Mango, MD   Stopped at 07/07/19 0830  . nicotine (NICODERM CQ - dosed in mg/24 hours) patch 14 mg  14 mg Transdermal Daily Gouru, Aruna, MD   14 mg at 07/12/19 1142  . nitroGLYCERIN (NITROSTAT) SL tablet 0.4 mg  0.4 mg Sublingual Q5 min PRN Gouru, Aruna, MD   0.4 mg at 07/07/19 0815  . ondansetron (ZOFRAN) tablet 4 mg  4 mg Oral Q6H PRN Harrie Foreman, MD       Or  . ondansetron [facility]) injection 4 mg  4 mg Intravenous Q6H PRN Harrie Foreman, MD   4 mg at 07/08/19 0204  . pantoprazole (PROTONIX) EC tablet 20 mg  20 mg Oral Daily Sindy Guadeloupe, MD   20 mg at 07/12/19 1139  . potassium chloride SA (K-DUR) CR tablet 40 mEq  40 mEq Oral Once Charlett Nose, RPH      . prochlorperazine (COMPAZINE) injection 10 mg  10 mg Intravenous Q6H PRN Harrie Foreman, MD   10 mg at 07/09/19 0202  . QUEtiapine (SEROQUEL) tablet 300 mg  300 mg Oral BID Money, Darnelle Maffucci B, FNP   300 mg at 07/12/19 1139  . sodium chloride flush (NS) 0.9 % injection 10-40 mL  10-40 mL Intracatheter Q12H Gouru, Aruna, MD   10 mL at 07/11/19 2241  . sodium chloride flush (NS) 0.9 % injection 10-40 mL  10-40 mL Intracatheter PRN Gouru, Aruna, MD      . thiamine (VITAMIN B-1) tablet 100 mg  100 mg Oral Daily Harrie Foreman, MD   100 mg at 07/12/19 1139    Musculoskeletal: Strength & Muscle Tone: within normal limits Gait & Station:  Patient remained in bed during evaluation Patient leans: N/A  Psychiatric Specialty Exam: Physical Exam  Nursing note and vitals reviewed. Constitutional: She is oriented to person, place, and time. She appears well-developed and well-nourished.  HENT:  Head: Normocephalic.  Neck: Normal range of motion.  Respiratory: Effort normal.  Musculoskeletal: Normal range of motion.  Neurological: She is alert and oriented to person, place, and time.  Psychiatric: Her speech is normal and behavior is normal. Thought content normal. Her mood appears anxious. Cognition and memory are impaired. She expresses inappropriate judgment.    Review of Systems  Constitutional: Negative.   HENT: Negative.   Eyes: Negative.   Respiratory: Negative.   Cardiovascular: Negative.   Gastrointestinal: Negative.   Genitourinary: Negative.   Musculoskeletal: Positive for back pain.  Skin: Negative.   Neurological: Negative.   Endo/Heme/Allergies: Negative.   Psychiatric/Behavioral: Positive for memory loss. The patient is nervous/anxious.     Blood pressure (!) 147/91, pulse 90, temperature 98 F (36.7 C), temperature source Oral, resp. rate 18, height 5\' 4"  (1.626 m), weight 74.2 kg, last menstrual period 07/05/2019, SpO2 100 %.Body mass index is 28.08 kg/m.  General Appearance: Casual  Eye Contact:  Good  Speech:  Regular  Volume:  Normal  Mood:  Anxious  Affect:  Flat  Thought Process:  Disorganized at times  Orientation:  Other:  self  Thought Content:  Delusions at times  Suicidal Thoughts:  No  Homicidal Thoughts:  No  Memory:  Fair  Judgement:  Impaired  Insight:  Lacking  Psychomotor Activity:  Decreased  Concentration:  Concentration: Poor  Recall:  Poor  Fund of Knowledge:  Fair  Language:  Poor  Akathisia:  No  Handed:  Right  AIMS (if indicated):     Assets:  Financial Resources/Insurance Housing Social Support  ADL's:  Impaired  Cognition:  Impaired,  Moderate  Sleep:         Treatment Plan Summary: Patient is confused and does not currently have capacity to make medical decisions  Bipolar affective disorder: Continue Seroquel 300 mg PO BID - IVC paperwork in place  Disposition: No evidence of imminent risk to self or others at present.   Patient does not meet criteria for psychiatric inpatient admission.  Waylan Boga, NP 07/12/2019 1:34 PM   Case discussed with Dr. Reita Cliche.  Agree with plan as outlined above.

## 2019-07-12 NOTE — Progress Notes (Signed)
Pharmacy Electrolyte Monitoring Consult:  Pharmacy consulted to assist in monitoring and replacing electrolytes in this 43 y.o. female admitted on 07/06/2019 with Chest Pain and Emesis   Labs:  Sodium (mmol/L)  Date Value  07/12/2019 141  07/16/2014 145   Potassium (mmol/L)  Date Value  07/12/2019 3.0 (L)  07/16/2014 3.6   Magnesium (mg/dL)  Date Value  07/12/2019 1.9  11/22/2013 1.5 (L)   Phosphorus (mg/dL)  Date Value  07/12/2019 4.5  02/03/2013 1.7 (L)   Calcium (mg/dL)  Date Value  07/12/2019 9.1   Calcium, Total (mg/dL)  Date Value  07/16/2014 8.9   Albumin (g/dL)  Date Value  07/12/2019 3.1 (L)  07/16/2014 3.4    Assessment/Plan: Patient receiving NS/33mEq Potassium at 145mL/hr. Potassium consistently trending down throughout admission.   Potassium 62mEq PO x 2 doses. Magnesium 1g IV x 1.   Will obtain electrolytes with am labs.   Will replace to maintain electrolytes within normal limits. Will replace for goal magnesium ~ 2 in setting of seizure disorder.   Pharmacy will continue to monitor and adjust per consult.  Peighton Mehra L 07/12/2019 1:28 PM

## 2019-07-12 NOTE — Progress Notes (Signed)
[facility] Hematology/Oncology Progress Note  Date of admission: 07/06/2019  Hospital day:  07/12/2019  Chief Complaint: Sarah Serrano is a 43 y.o. female who was admitted with pancreatitis.  Subjective:  Feeling much better.  Thinking is clear.  Abdomen, tongue, and bone marrow site sore.  Tolerated lunch/breakfast well (woke up late).  Social History: The patient is accompanied by a Actuary today.  Allergies: No Known Allergies  Scheduled Medications: . dexamethasone  40 mg Oral Daily  . folic acid  1 mg Oral Daily  . metoprolol tartrate  50 mg Oral BID  . multivitamin with minerals  1 tablet Oral Daily  . naloxone  1 spray Nasal Once  . nicotine  14 mg Transdermal Daily  . pantoprazole  20 mg Oral Daily  . potassium chloride  40 mEq Oral Once  . QUEtiapine  300 mg Oral BID  . sodium chloride flush  10-40 mL Intracatheter Q12H  . thiamine  100 mg Oral Daily    Review of Systems: GENERAL:  Feels better. PERFORMANCE STATUS (ECOG):  2 HEENT:  Tongue sore s/p bit with seizure.  No visual changes, runny nose, sore throat, mouth sores or tenderness. Lungs: No shortness of breath or cough.  No hemoptysis. Cardiac:  No chest pain, palpitations, orthopnea, or PND. GI:  Tolerated breakfast/lunch.  Abdominal discomfort/pain.  No nausea, vomiting, diarrhea, constipation, melena or hematochezia. GU:  No urgency, frequency, dysuria, or hematuria. Musculoskeletal:  Sore at bone marrow site.  Back pain. Extremities:  No pain or swelling. Skin:  No rashes or skin changes. Neuro:  Denies prior history of seizures.  No  Focal numbness or weakness, balance or coordination issues. Endocrine:  No diabetes, thyroid issues, hot flashes or night sweats. Psych:  Anxious.  Mood improved when discussing labs.  Pain:  Tongue, abdomen, back, bone marrow site. Review of systems:  All other systems reviewed and found to be negative.  Physical Exam: Blood pressure (!) 147/91,  pulse 90, temperature 98 F (36.7 C), temperature source Oral, resp. rate 18, height 5\' 4"  (1.626 m), weight 163 lb 9.3 oz (74.2 kg), last menstrual period 07/05/2019, SpO2 100 %.  GENERAL:  Well developed, well nourished, woman sitting up on the medical unit in no acute distress. MENTAL STATUS:  Alert and oriented to person, place and time. HEAD:  Braided hair.  Normocephalic, atraumatic, face symmetric, no Cushingoid features. EYES:  Brown eyes.  Pupils equal round and reactive to light and accomodation.  No conjunctivitis or scleral icterus. ENT:  Oropharynx clear without lesion. Small amount of tongue trauma anteriorly. Mucous membranes moist.  RESPIRATORY:  Clear to auscultation without rales, wheezes or rhonchi. CARDIOVASCULAR:  Regular rate and rhythm without murmur, rub or gallop. ABDOMEN:  Soft, slightly tender, without guarding or rebound tenderness.  Active bowel sounds and no hepatosplenomegaly.  No masses. SKIN:  Left posterior iliac crest dressing intact.  No erythema.  No rashes, ulcers or lesions. EXTREMITIES: Right upper extremity PIC line.  No edema, no skin discoloration or tenderness.  No palpable cords. LYMPH NODES: No palpable cervical, supraclavicular, axillary or inguinal adenopathy  NEUROLOGICAL: Unremarkable.  Engaging.  Appropriate. PSYCH:  Appropriate.   Results for orders placed or performed during the hospital encounter of 07/06/19 (from the past 48 hour(s))  CBC with Differential/Platelet     Status: Abnormal   Collection Time: 07/11/19  4:40 AM  Result Value Ref Range   WBC 3.1 (L) 4.0 - 10.5 K/uL   RBC 3.56 (  L) 3.87 - 5.11 MIL/uL   Hemoglobin 11.5 (L) 12.0 - 15.0 g/dL    Comment: POST TRANSFUSION SPECIMEN   HCT 32.1 (L) 36.0 - 46.0 %   MCV 90.2 80.0 - 100.0 fL   MCH 32.3 26.0 - 34.0 pg   MCHC 35.8 30.0 - 36.0 g/dL   RDW 16.4 (H) 11.5 - 15.5 %   Platelets 15 (LL) 150 - 400 K/uL    Comment: Immature Platelet Fraction may be clinically indicated,  consider ordering this additional test [id number] CRITICAL VALUE NOTED.  VALUE IS CONSISTENT WITH PREVIOUSLY REPORTED AND CALLED VALUE.    nRBC 6.2 (H) 0.0 - 0.2 %   Neutrophils Relative % 56 %   Neutro Abs 1.7 1.7 - 7.7 K/uL   Lymphocytes Relative 29 %   Lymphs Abs 0.9 0.7 - 4.0 K/uL   Monocytes Relative 11 %   Monocytes Absolute 0.3 0.1 - 1.0 K/uL   Eosinophils Relative 0 %   Eosinophils Absolute 0.0 0.0 - 0.5 K/uL   Basophils Relative 0 %   Basophils Absolute 0.0 0.0 - 0.1 K/uL   Smear Review PLATELETS APPEAR DECREASED     Comment: PLATELET COUNT CONFIRMED BY SMEAR   Immature Granulocytes 4 %   Abs Immature Granulocytes 0.12 (H) 0.00 - 0.07 K/uL   Hypersegmented Neutrophils PRESENT    Schistocytes PRESENT    Polychromasia PRESENT     Comment: Performed at [facility], [address] [place], [place] [address]  Lipase, blood     Status: Abnormal   Collection Time: 07/11/19  4:40 AM  Result Value Ref Range   Lipase 85 (H) 11 - 51 U/L    Comment: Performed at [facility], [address] [place], [place] [id number]  Basic metabolic panel     Status: Abnormal   Collection Time: 07/11/19  4:40 AM  Result Value Ref Range   Sodium 139 135 - 145 mmol/L   Potassium 4.2 3.5 - 5.1 mmol/L   Chloride 114 (H) 98 - 111 mmol/L   CO2 18 (L) 22 - 32 mmol/L   Glucose, Bld 98 70 - 99 mg/dL   BUN 6 6 - 20 mg/dL   Creatinine, Ser 0.48 0.44 - 1.00 mg/dL   Calcium 8.9 8.9 - 10.3 mg/dL   GFR calc non Af Amer >60 >60 mL/min   GFR calc Af Amer >60 >60 mL/min   Anion gap 7 5 - 15    Comment: Performed at [facility], [address], [place], [place] [address]  Magnesium     Status: None   Collection Time: 07/11/19  4:40 AM  Result Value Ref Range   Magnesium 1.8 1.7 - 2.4 mg/dL    Comment: Performed at [facility], [address] [place], [place] [address]  Pathologist smear review     Status: None   Collection Time: 07/11/19  4:40 AM  Result Value  Ref Range   Path Review Peripheral blood smear is reviewed.     Comment: Patient with marked thrombocytopenia, status post bone marrow yesterday for further evaluation. History of alcohol and pancreatitis. RBCs with anisocytosis, target celss, nucleated RBCs, and rouleaux. Schistocytes are present. Subset of neutrophils with hypersegmentation. Persistent marked thrombocytopenia with normal platelet morphology. New finding of schistocytes communicated to Dr. Janese Banks on [id number] at [id number]. Complicated medical history with recent labs not revealing evidence of hemolysis.   The differential includes new onset microangiopathic hemolytic anemia, immune mediated process such as transfusion reaction,  folate deficiency associated changes, etc. Reviewed by Dellia Nims. Reuel Derby, M.D. Performed at [facility], [address] [place], [place] [address]   Lactate dehydrogenase     Status: Abnormal   Collection Time: 07/11/19 10:46 AM  Result Value Ref Range   LDH 339 (H) 98 - 192 U/L    Comment: Performed at [facility], [address] [place], [place] [address]  Reticulocytes     Status: Abnormal   Collection Time: 07/11/19 10:46 AM  Result Value Ref Range   Retic Ct Pct 2.0 0.4 - 3.1 %   RBC. 2.65 (L) 3.87 - 5.11 MIL/uL   Retic Count, Absolute 52.5 19.0 - 186.0 K/uL   Immature Retic Fract 52.0 (H) 2.3 - 15.9 %    Comment: Performed at [facility], [address] [place], [place] [address]  Protime-INR     Status: None   Collection Time: 07/11/19 10:46 AM  Result Value Ref Range   Prothrombin Time 13.2 11.4 - 15.2 seconds   INR 1.0 0.8 - 1.2    Comment: (NOTE) INR goal varies based on device and disease states. Performed at [facility], [address] [place], [place] [address]   APTT     Status: None   Collection Time: 07/11/19 10:46 AM  Result Value Ref Range   aPTT 28 24 - 36 seconds    Comment: Performed at [facility], [address] [place], [place] [address]  Comprehensive metabolic panel     Status: Abnormal   Collection Time: 07/11/19 10:46 AM  Result Value Ref Range   Sodium 139 135 - 145 mmol/L   Potassium 3.6 3.5 - 5.1 mmol/L   Chloride 112 (H) 98 - 111 mmol/L   CO2 19 (L) 22 - 32 mmol/L   Glucose, Bld 118 (H) 70 - 99 mg/dL   BUN 6 6 - 20 mg/dL   Creatinine, Ser 0.50 0.44 - 1.00 mg/dL   Calcium 8.9 8.9 - 10.3 mg/dL   Total Protein 6.9 6.5 - 8.1 g/dL   Albumin 2.9 (L) 3.5 - 5.0 g/dL   AST 43 (H) 15 - 41 U/L   ALT 29 0 - 44 U/L   Alkaline Phosphatase 52 38 - 126 U/L   Total Bilirubin 0.8 0.3 - 1.2 mg/dL   GFR calc non Af Amer >60 >60 mL/min   GFR calc Af Amer >60 >60 mL/min   Anion gap 8 5 - 15    Comment: Performed at [facility], [address] [place], [place] [address]  Prepare fresh frozen plasma     Status: None   Collection Time: 07/11/19  3:01 PM  Result Value Ref Range   Unit Number [id number]    Blood Component Type THAWED PLASMA    Unit division 00    Status of Unit ISSUED,FINAL    Transfusion Status      OK TO TRANSFUSE Performed at [facility], [place]., [place],  [address]   CBC with Differential/Platelet     Status: Abnormal   Collection Time: 07/12/19  8:15 AM  Result Value Ref Range   WBC 8.3 4.0 - 10.5 K/uL   RBC 2.62 (L) 3.87 - 5.11 MIL/uL   Hemoglobin 8.4 (L) 12.0 - 15.0 g/dL    Comment: REPEATED TO VERIFY   HCT 24.4 (L) 36.0 - 46.0 %   MCV 93.1 80.0 - 100.0 fL   MCH 32.1 26.0 - 34.0 pg  MCHC 34.4 30.0 - 36.0 g/dL   RDW 17.3 (H) 11.5 - 15.5 %   Platelets 52 (L) 150 - 400 K/uL    Comment: Immature Platelet Fraction may be clinically indicated, consider ordering this additional test [id number]    nRBC 1.4 (H) 0.0 - 0.2 %   Neutrophils Relative % 54 %   Neutro Abs 4.5 1.7 - 7.7 K/uL   Lymphocytes Relative 31 %   Lymphs Abs 2.6 0.7 - 4.0 K/uL   Monocytes Relative 10 %   Monocytes Absolute 0.8 0.1 - 1.0 K/uL   Eosinophils Relative 0  %   Eosinophils Absolute 0.0 0.0 - 0.5 K/uL   Basophils Relative 0 %   Basophils Absolute 0.0 0.0 - 0.1 K/uL   WBC Morphology MORPHOLOGY UNREMARKABLE    Smear Review PLATELETS APPEAR DECREASED    Immature Granulocytes 5 %   Abs Immature Granulocytes 0.44 (H) 0.00 - 0.07 K/uL   Schistocytes PRESENT    Tammy Sours Bodies PRESENT    Polychromasia PRESENT    Target Cells PRESENT     Comment: Performed at [facility], [address] [place], [place] [address]  Lipase, blood     Status: Abnormal   Collection Time: 07/12/19  8:15 AM  Result Value Ref Range   Lipase 178 (H) 11 - 51 U/L    Comment: Performed at [facility], [address] [place], [place] [id number]  Basic metabolic panel     Status: Abnormal   Collection Time: 07/12/19  8:15 AM  Result Value Ref Range   Sodium 141 135 - 145 mmol/L   Potassium 3.0 (L) 3.5 - 5.1 mmol/L   Chloride 108 98 - 111 mmol/L   CO2 22 22 - 32 mmol/L   Glucose, Bld 94 70 - 99 mg/dL   BUN 6 6 - 20 mg/dL   Creatinine, Ser 0.58 0.44 - 1.00 mg/dL   Calcium 9.1 8.9 - 10.3 mg/dL   GFR calc non Af Amer >60 >60 mL/min   GFR calc Af Amer >60 >60 mL/min   Anion gap 11 5 - 15    Comment: Performed at [facility], [address] [place], [place] [address]  Magnesium     Status: None   Collection Time: 07/12/19  8:15 AM  Result Value Ref Range   Magnesium 1.9 1.7 - 2.4 mg/dL    Comment: Performed at [facility], [address] [place], [place] [address]  Reticulocytes     Status: Abnormal   Collection Time: 07/12/19  8:15 AM  Result Value Ref Range   Retic Ct Pct 5.2 (H) 0.4 - 3.1 %   RBC. 2.62 (L) 3.87 - 5.11 MIL/uL   Retic Count, Absolute 137.0 19.0 - 186.0 K/uL   Immature Retic Fract 46.7 (H) 2.3 - 15.9 %    Comment: Performed at [facility], [address] [place], [place] [address]  Lactate dehydrogenase     Status: Abnormal   Collection Time: 07/12/19  8:15 AM  Result Value Ref Range    LDH 348 (H) 98 - 192 U/L    Comment: Performed at [facility], [address] [place],  [address]  Hepatic function panel     Status: Abnormal   Collection Time: 07/12/19  8:15 AM  Result Value Ref Range   Total Protein 7.0 6.5 - 8.1 g/dL   Albumin 3.1 (L) 3.5 - 5.0 g/dL   AST 65 (H) 15 - 41 U/L  ALT 57 (H) 0 - 44 U/L   Alkaline Phosphatase 49 38 - 126 U/L   Total Bilirubin 0.7 0.3 - 1.2 mg/dL   Bilirubin, Direct 0.2 0.0 - 0.2 mg/dL   Indirect Bilirubin 0.5 0.3 - 0.9 mg/dL    Comment: Performed at [facility], [address] [place], [place] [address]  Fibrin derivatives D-[facility] only)     Status: Abnormal   Collection Time: 07/12/19  8:15 AM  Result Value Ref Range   Fibrin derivatives D-dimer ([facility]) 1,364.95 (H) 0.00 - 499.00 ng/mL (FEU)    Comment: (NOTE) <> Exclusion of Venous Thromboembolism (VTE) - OUTPATIENT ONLY   (Emergency Department or [place])   0-499 ng/ml (FEU): With a low to intermediate pretest probability                      for VTE this test result excludes the diagnosis                      of VTE.   >499 ng/ml (FEU) : VTE not excluded; additional work up for VTE is                      required. <> Testing on Inpatients and Evaluation of Disseminated Intravascular   Coagulation (DIC) Reference Range:   0-499 ng/ml (FEU) Performed at [facility], [address] [place], [place] [address]   Phosphorus     Status: None   Collection Time: 07/12/19  8:15 AM  Result Value Ref Range   Phosphorus 4.5 2.5 - 4.6 mg/dL    Comment: Performed at [facility], [address] [place],  [address]   Mr Jeri Cos Wo Contrast  Result Date: 07/11/2019 CLINICAL DATA:  Encephalopathy EXAM: MRI HEAD WITHOUT AND WITH CONTRAST TECHNIQUE: Multiplanar, multiecho pulse sequences of the brain and surrounding structures were obtained without and with intravenous contrast. CONTRAST:  42mL GADAVIST GADOBUTROL 1 MMOL/ML IV SOLN  COMPARISON:  CT head 07/07/2019 FINDINGS: Brain: Ventricle size and cerebral volume normal. Negative for infarct, hemorrhage, mass. No edema or midline shift. Normal enhancement postcontrast administration Vascular: Normal arterial flow voids. Skull and upper cervical spine: Negative Sinuses/Orbits: Negative Other: None IMPRESSION: Negative MRI head with contrast. Electronically Signed   By: Franchot Gallo M.D.   On: 07/11/2019 14:29    Assessment:  Sarah Serrano is a 43 y.o. female with pancreatitis complicated by thrombocytopenia.  Patient with apparent alcohol withdrawal seizure per chart.  None since 07/07/2019.  Head MRI negative.  Patient on Keppra.  Peripheral smear today reveals only rare fragmented RBC.  Symptomatically, she is feeling much better.  She denies any bleeding.  She continues to have some abdominal pain.  Platelet count 52,000.  Plan: 1. Normocytic anemia  Hemoglobin on admission was 11.7 with nadir of 7.1 on 07/07/2019.    She received 1 unit of PRBCs on 07/07/2019 with anticipated improvement to 8.3.    Suspect hemoglobin of 11.5 yesterday was spurious.  Hemoglobin today is 8.4 and c/w stability.  Reticulocyte count has improved from 0.5 to 2.0 to 5.2% today felt secondary to recovering marrow.   (WBC 6800 to 2900 and now 8300).  No evidence of hemolysis   Haptoglobin on 07/07/2019 was normal.   LDH modestly elevated (456 to 401 to 325 to 339 to 348).   Bilirubin normal.   Peripheral smear review today reveals rare fragmented RBC and no spherocytes.  No evidence of bleeding   Stool guaiac negative.   Abdomen/pelvic CT on 09/06 and 09/09 revealed no retroperitoneal bleed.  Patient B12 and folate deficient   B12 and folate supplementation initiated  2.   Thrombocytopenia  Patient's baseline platelet count 387,000 in 06/2014 with an admission platelet count of 12,000.  Platelet count has been in the single digits from 07/07/2019 to 07/09/2019.  She has  received 3 platelet transfusions (09/07, 09/08, and 09/09).   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  She received 80 mg of prednisone on 07/09/2019.  She began 4 days of Decadron 40mg /day on 07/11/2019.  Platelet count today 52,000.  Typical improvement with steroids for ITP in 3 days.  No evidence of DIC (PT/PTT normal on 07/11/2019)  Clonidine and Zoloft discontinued as felt could contribute to thrombocytopenia.  Consideration of some component of alcohol suppression.  Doubt TTP   No MAHA, fever, renal insufficiency or neurologic changes (seizure apparently related to alcohol withdrawal).   Thrombocytopenia improving.   Patient received 1 unit FFP on 07/11/2019.   Peripheral smear today reveals rare fragmented RBC (typically >= 2 per HPF).   Initial PLASMIC score 6; Current PLASMIC score 5 (int risk).   ADAMTS13 pending.   Per chart, patient to be transferred if ADAMTS13 +.  Follow-up bone marrow performed on 07/10/2019. 3.  Leukopenia  Acute drop in WBC on 07/09/2019 - 07/11/2019.  WBCs hypersegmented c/w B12 and folate deficiency.  Suspect recovering marrow. 4.   Disposition  Continue to monitor closely over weekend.  Anticipate ongoing improvement.  Contact MD if any concerning issues.   Lequita Asal, MD  07/12/2019, 1:40 PM

## 2019-07-12 NOTE — Progress Notes (Addendum)
[facility] NAME: Sarah Serrano    MR#:  [id number]  DATE OF BIRTH:  October 22, 1976  SUBJECTIVE:  patient is under IVC. She is tearful. She is asking for pain meds on a regular basis along with Ativan. Complains of lower abdominal pain along with back pain. No nausea vomiting.  She is asking me as to why she has been committed. No fever.  REVIEW OF SYSTEMS:   Review of Systems  Constitutional: Negative for chills, fever and weight loss.  HENT: Negative for ear discharge, ear pain and nosebleeds.   Eyes: Negative for blurred vision, pain and discharge.  Respiratory: Negative for sputum production, shortness of breath, wheezing and stridor.   Cardiovascular: Negative for chest pain, palpitations, orthopnea and PND.  Gastrointestinal: Positive for abdominal pain. Negative for diarrhea, nausea and vomiting.  Genitourinary: Negative for frequency and urgency.  Musculoskeletal: Positive for back pain. Negative for joint pain.  Neurological: Negative for sensory change, speech change, focal weakness and weakness.  Psychiatric/Behavioral: Negative for depression and hallucinations. The patient is nervous/anxious.    Tolerating Diet:some Tolerating PT: not needed  DRUG ALLERGIES:  No Known Allergies  VITALS:  Blood pressure (!) 138/96, pulse 89, temperature 98.4 F (36.9 C), temperature source Oral, resp. rate 18, height 5' 4"  (1.626 m), weight 74.2 kg, last menstrual period 07/05/2019, SpO2 100 %.  PHYSICAL EXAMINATION:   Physical Exam  GENERAL:  43 y.o.-year-old patient lying in the bed with mild acute distress.  EYES: Pupils equal, round, reactive to light and accommodation. No scleral icterus. Extraocular muscles intact.  HEENT: Head atraumatic, normocephalic. Oropharynx and nasopharynx clear.  NECK:  Supple, no jugular venous distention. No thyroid enlargement, no tenderness.  LUNGS: Normal breath sounds bilaterally, no  wheezing, rales, rhonchi. No use of accessory muscles of respiration.  CARDIOVASCULAR: S1, S2 normal. No murmurs, rubs, or gallops.  ABDOMEN: Soft, mild tenderness, nondistended. Bowel sounds present. No organomegaly or mass.  EXTREMITIES: No cyanosis, clubbing or edema b/l.    NEUROLOGIC: Cranial nerves II through XII are intact. No focal Motor or sensory deficits b/l.   PSYCHIATRIC:  patient is alert and oriented x 3. Anxious, tearful SKIN: No obvious rash, lesion, or ulcer.   LABORATORY PANEL:  CBC Recent Labs  Lab 07/11/19 0440  WBC 3.1*  HGB 11.5*  HCT 32.1*  PLT 15*    Chemistries  Recent Labs  Lab 07/11/19 0440 07/11/19 1046  NA 139 139  K 4.2 3.6  CL 114* 112*  CO2 18* 19*  GLUCOSE 98 118*  BUN 6 6  CREATININE 0.48 0.50  CALCIUM 8.9 8.9  MG 1.8  --   AST  --  43*  ALT  --  29  ALKPHOS  --  52  BILITOT  --  0.8   Cardiac Enzymes No results for input(s): TROPONINI in the last 168 hours. RADIOLOGY:  Mr Jeri Cos Wo Contrast  Result Date: 07/11/2019 CLINICAL DATA:  Encephalopathy EXAM: MRI HEAD WITHOUT AND WITH CONTRAST TECHNIQUE: Multiplanar, multiecho pulse sequences of the brain and surrounding structures were obtained without and with intravenous contrast. CONTRAST:  83m GADAVIST GADOBUTROL 1 MMOL/ML IV SOLN COMPARISON:  CT head 07/07/2019 FINDINGS: Brain: Ventricle size and cerebral volume normal. Negative for infarct, hemorrhage, mass. No edema or midline shift. Normal enhancement postcontrast administration Vascular: Normal arterial flow voids. Skull and upper cervical spine: Negative Sinuses/Orbits: Negative Other: None IMPRESSION: Negative MRI head with contrast. Electronically Signed  By: Franchot Gallo M.D.   On: 07/11/2019 14:29   Ct Bone Marrow Biopsy & Aspiration  Result Date: 07/10/2019 INDICATION: History of polysubstance and bipolar disorder, now with thrombocytopenia of uncertain etiology. Please perform CT-guided bone marrow biopsy for tissue  diagnostic purposes. EXAM: CT-GUIDED BONE MARROW BIOPSY AND ASPIRATION MEDICATIONS: None ANESTHESIA/SEDATION: Fentanyl 150 mcg IV; Versed 5 mg IV Sedation Time: 23 Minutes; The patient was continuously monitored during the procedure by the interventional radiology nurse under my direct supervision. COMPLICATIONS: None immediate. PROCEDURE: Informed consent was obtained via the use of a emergency consent given the patient has been deemed unable to make medical decisions by psychiatry consult. A time out was performed prior to the initiation of the procedure. The patient was positioned prone and non-contrast localization CT was performed of the pelvis to demonstrate the iliac marrow spaces. The operative site was prepped and draped in the usual sterile fashion. Under sterile conditions and local anesthesia, a 22 gauge spinal needle was utilized for procedural planning. Next, an 11 gauge coaxial bone biopsy needle was advanced into the left iliac marrow space. Needle position was confirmed with CT imaging. Initially, bone marrow aspiration was performed. Next, a bone marrow biopsy was obtained with the 11 gauge outer bone marrow device. The 11 gauge coaxial bone biopsy needle was re-advanced into a slightly different location within the left iliac marrow space, positioning was confirmed and an additional bone marrow biopsy was obtained. The needle was removed intact. Hemostasis was obtained with compression and a dressing was placed. The patient tolerated the procedure well without immediate post procedural complication. IMPRESSION: Successful CT guided left iliac bone marrow aspiration and core biopsy. Note, despite the administration of a significant amount of conscious sedation medications, the patient overall tolerated the procedure poorly. Electronically Signed   By: Sandi Mariscal M.D.   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  Concern about TTP -Plan is to transfer her to tertiary care center.  -Called and spoke with [facility]--she is on the transfer list  #bipolar disorder with substance abuse CT head is negative MRI of the brain is also negative Patient is medically incompetent per psychiatry.  IVC papers in place -seroquel 300 gm bid  #Acute pancreatitis alcohol induced -Clinically improving--lipase down  - IV fluids for now--once labs improve d/c it -pt willing to try regular diet -Pain medications as needed   -CT abdomen has revealed acute pancreatitis with no abscess -Repeat CT abdomen on 07/09/2019 revealed improving acute pancreatitis compared to prior study of CT scan.  Enhancing lesion in the left hepatic lobe favoring benign vascular lesion and uterine leiomyoma increased in size from May 2015 -GI Dr. Allen Norris has seen the patient and will remotely follow as she is clinically improving -lipase at 140-89-85  #Symptomatic anemia no active bleeding -GI is following -Stool for occult blood-negative -Hemoglobin 12.6-10.3-8.5-7.1 given 1 unit of blood transfusion hemoglobin at 8.4-11.5  #Recurrent breakthrough seizures, from alcohol withdrawal -IV Ativan as needed -1 g IV Keppra given and neurology is recommending to continue Keppra 500 every  12 hours -Dr. Doy Mince is following -EEG ordered but patient has refused EEG and further work-up  #Hypomagnesemia and hypokalemia replete and recheck IV magnesium ordered, repeat magnesium is at 2.0  #Essential  hypertension -Blood pressure is soft -IV labetalol as needed - metoprolol dose increased to 50 mg p.o. twice daily  #Hepatic lobe lesion seems to be benign PCP to follow-up and refer to hepatologist if needed  #Uterine leiomyoma outpatient follow-up with GYN   Spoke with Legrand Como boyfriend--[phone]  CODE STATUS: full  DVT Prophylaxis: SCD  TOTAL TIME TAKING CARE OF THIS PATIENT: *30* minutes.  >50% time spent on counselling and coordination of care  POSSIBLE D/C IN *?* DAYS, DEPENDING ON CLINICAL CONDITION. Waiting for possible transfer  Note: This dictation was prepared with Dragon dictation along with smaller phrase technology. Any transcriptional errors that result from this process are unintentional.  Fritzi Mandes M.D on 07/12/2019 at 7:44 AM  Between 7am to 6pm - Pager - [phone]  After 6pm go to [email or web address] - password [email or web address] [facility]  Office  [phone]  CC: Primary care physician; Patient, No Pcp PerPatient ID: Sarah Serrano, female   DOB: 06/19/1976, 43 y.o.   MRN: [id number]

## 2019-07-13 LAB — BASIC METABOLIC PANEL
Anion gap: 9 (ref 5–15)
BUN: 11 mg/dL (ref 6–20)
CO2: 21 mmol/L — ABNORMAL LOW (ref 22–32)
Calcium: 9 mg/dL (ref 8.9–10.3)
Chloride: 109 mmol/L (ref 98–111)
Creatinine, Ser: 0.68 mg/dL (ref 0.44–1.00)
GFR calc Af Amer: >60 mL/min (ref >60)
GFR calc non Af Amer: >60 mL/min (ref >60)
Glucose, Bld: 112 mg/dL — ABNORMAL HIGH (ref 70–99)
Potassium: 4 mmol/L (ref 3.5–5.1)
Sodium: 139 mmol/L (ref 135–145)

## 2019-07-13 LAB — MAGNESIUM: Magnesium: 2.1 mg/dL (ref 1.7–2.4)

## 2019-07-13 LAB — CBC
HCT: 26.5 % — ABNORMAL LOW (ref 36.0–46.0)
Hemoglobin: 8.9 g/dL — ABNORMAL LOW (ref 12.0–15.0)
MCH: 31.8 pg (ref 26.0–34.0)
MCHC: 33.6 g/dL (ref 30.0–36.0)
MCV: 94.6 fL (ref 80.0–100.0)
Platelets: 132 10*3/uL — ABNORMAL LOW (ref 150–400)
RBC: 2.8 MIL/uL — ABNORMAL LOW (ref 3.87–5.11)
RDW: 17.2 % — ABNORMAL HIGH (ref 11.5–15.5)
WBC: 10.5 10*3/uL (ref 4.0–10.5)
nRBC: 1.1 % — ABNORMAL HIGH (ref 0.0–0.2)

## 2019-07-13 LAB — LIPASE, BLOOD: Lipase: 131 U/L — ABNORMAL HIGH (ref 11–51)

## 2019-07-13 MED ORDER — NALOXONE HCL 0.4 MG/ML IJ SOLN
INTRAMUSCULAR | Status: AC
  Administered 2019-07-13: 0.4 mg
  Filled 2019-07-13: qty 1

## 2019-07-13 MED ORDER — OXYCODONE-ACETAMINOPHEN 5-325 MG PO TABS
1.0000 | ORAL_TABLET | Freq: Three times a day (TID) | ORAL | Status: DC | PRN
  Administered 2019-07-14 (×2): 1 via ORAL
  Filled 2019-07-13 (×2): qty 1

## 2019-07-13 MED ORDER — NALOXONE HCL 0.4 MG/ML IJ SOLN: 0.4000 mg | INTRAMUSCULAR | Status: DC | PRN

## 2019-07-13 MED ORDER — LEVETIRACETAM 500 MG PO TABS
500.0000 mg | ORAL_TABLET | Freq: Two times a day (BID) | ORAL | Status: DC
  Administered 2019-07-13 – 2019-07-14 (×2): 500 mg via ORAL
  Filled 2019-07-13 (×3): qty 1

## 2019-07-13 NOTE — Progress Notes (Signed)
Asked patient to please display contents of purse at patient bedside. Patient agreed to do so and displayed contents of purse. Patient had in her possession a "vape" device. This RN labeled "vape" device and placed in patient medication bin in the medication room. Patient denies having any home medications at the bedside. Patient educated on safety concerns of falling asleep while eating or using the patient bathroom. It was explained to patient that 1:1 sitter and this RN will continue to monitor patient closely for safety due to her markedly increased sleepiness. Patient reported that she "feels like she's in jail" and patient was tearful. This RN reiterated that the monitoring was for the safety of the patient to prevent aspiration and/ or a patient fall. Safety education with patient needs continuous reinforcement because patient appears to not understand the safety risks of falling asleep while eating or using the toilet. Will continue to monitor.

## 2019-07-13 NOTE — Progress Notes (Addendum)
[facility] NAME: Sarah Serrano    MR#:  [id number]  DATE OF BIRTH:  1976/01/05  SUBJECTIVE:  Had 2 episodes of sleeping very hard and recieveing narcan x2. Pt requesting regular food but has pain of 6-7/10 No vomiting   REVIEW OF SYSTEMS:   Review of Systems  Constitutional: Negative for chills, fever and weight loss.  HENT: Negative for ear discharge, ear pain and nosebleeds.   Eyes: Negative for blurred vision, pain and discharge.  Respiratory: Negative for sputum production, shortness of breath, wheezing and stridor.   Cardiovascular: Negative for chest pain, palpitations, orthopnea and PND.  Gastrointestinal: Positive for abdominal pain. Negative for diarrhea, nausea and vomiting.  Genitourinary: Negative for frequency and urgency.  Musculoskeletal: Positive for back pain. Negative for joint pain.  Neurological: Negative for sensory change, speech change, focal weakness and weakness.  Psychiatric/Behavioral: Negative for depression and hallucinations. The patient is nervous/anxious.    Tolerating Diet:some Tolerating PT: not needed  DRUG ALLERGIES:  No Known Allergies  VITALS:  Blood pressure 125/73, pulse (!) 104, temperature 97.9 F (36.6 C), temperature source Oral, resp. rate 18, height 5\' 4"  (1.626 m), weight 74.2 kg, last menstrual period 07/05/2019, SpO2 98 %.  PHYSICAL EXAMINATION:   Physical Exam  GENERAL:  43 y.o.-year-old patient lying in the bed with mild acute distress.  EYES: Pupils equal, round, reactive to light and accommodation. No scleral icterus. Extraocular muscles intact.  HEENT: Head atraumatic, normocephalic. Oropharynx and nasopharynx clear.  NECK:  Supple, no jugular venous distention. No thyroid enlargement, no tenderness.  LUNGS: Normal breath sounds bilaterally, no wheezing, rales, rhonchi. No use of accessory muscles of respiration.  CARDIOVASCULAR: S1, S2 normal. No murmurs,  rubs, or gallops.  ABDOMEN: Soft, mild tenderness, nondistended. Bowel sounds present. No organomegaly or mass.  EXTREMITIES: No cyanosis, clubbing or edema b/l.    NEUROLOGIC: Cranial nerves II through XII are intact. No focal Motor or sensory deficits b/l.   PSYCHIATRIC:  patient is alert and oriented x 3. Anxious, tearful SKIN: No obvious rash, lesion, or ulcer.   LABORATORY PANEL:  CBC Recent Labs  Lab 07/13/19 0420  WBC 10.5  HGB 8.9*  HCT 26.5*  PLT 132*    Chemistries  Recent Labs  Lab 07/12/19 0815 07/13/19 0420  NA 141 139  K 3.0* 4.0  CL 108 109  CO2 22 21*  GLUCOSE 94 112*  BUN 6 11  CREATININE 0.58 0.68  CALCIUM 9.1 9.0  MG 1.9 2.1  AST 65*  --   ALT 57*  --   ALKPHOS 49  --   BILITOT 0.7  --    Cardiac Enzymes No results for input(s): TROPONINI in the last 168 hours. RADIOLOGY:  No results found. 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 No s/o hemolysis -+ schistocytes noted on PS couple days ago -ADAMTS-13 p41% -pt has received FFP 07/11/2019 -appreciate oncology recommendations--Dr Corcoran -[facility]  -Discontinued clonidine and Zoloft, as recommended by pharmacy which could cause thrombocytopenia -Platelet count is at 15,000--52K--132K -Hemoglobin 11.5 -MRI of the brain with and without contrast is negative -Peripheral smear with schistocytes per hematology.  Concern about TTP  # episode of AMS ?narcotic /muscle relaxants/ativan--received narcan x2 -now awake and alert -I have Discontinued most of the sedating meds.  #bipolar disorder with substance abuse CT head  is negative MRI of the brain is also negative Patient is medically incompetent per psychiatry.  IVC papers in place -seroquel 300 gm bid  #Acute pancreatitis alcohol induced -Clinically improving--lipase down  -Pain medications as needed   -CT abdomen has  revealed acute pancreatitis with no abscess -Repeat CT abdomen on 07/09/2019 revealed improving acute pancreatitis compared to prior study of CT scan.  Enhancing lesion in the left hepatic lobe favoring benign vascular lesion and uterine leiomyoma increased in size from May 2015 -GI Dr. Allen Norris has seen the patient and will remotely follow as she is clinically improving -lipase at [id number] -pt is requesting regular food. Told her with her lipase improving will start regular diet.   #Symptomatic anemia no active bleeding -GI is following--now signed off -Stool for occult blood-negative -Hemoglobin 12.6-10.3-8.5-7.1 given 1 unit of blood transfusion hemoglobin at 8.4-11.5  #Recurrent breakthrough seizures, from alcohol withdrawal -1 g IV Keppra given and neurology is recommending to continuepo  Keppra 500 every 12 hours -Dr. Doy Mince is following -EEG ordered but patient has refused EEG and further work-up  #Hypomagnesemia and hypokalemia replete and recheck IV magnesium ordered, repeat magnesium is at 2.0  #Essential hypertension -IV labetalol as needed - metoprolol dose increased to 50 mg p.o. twice daily  #Hepatic lobe lesion seems to be benign PCP to follow-up and refer to hepatologist if needed  #Uterine leiomyoma outpatient follow-up with GYN  D/w dr Lenoard Aden with [name]  CODE STATUS: full  DVT Prophylaxis: SCD  TOTAL TIME TAKING CARE OF THIS PATIENT: *30* minutes.  >50% time spent on counselling and coordination of care  POSSIBLE D/C IN *?* DAYS, DEPENDING ON CLINICAL CONDITION. Waiting for possible transfer  Note: This dictation was prepared with Dragon dictation along with smaller phrase technology. Any transcriptional errors that result from this process are unintentional.  Fritzi Mandes M.D on 07/13/2019 at 2:43 PM  Between 7am to 6pm - Pager - [phone]  After 6pm go to [email or web address] - password [email or web address] [facility]  Office  [phone]  CC: Primary care physician; Patient, No Pcp PerPatient ID: Sarah Serrano, female   DOB: Jul 29, 1976, 43 y.o.   MRN: [id number]

## 2019-07-13 NOTE — Consult Note (Addendum)
[facility] Consult   Reason for Consult:  IVC Referring Physician:  EDP Patient Identification: Sarah Serrano MRN:  [id number] Principal Diagnosis: Pancreatitis Diagnosis:  Principal Problem:   Pancreatitis Active Problems:   Bipolar disorder ([place])   Severe thrombocytopenia ([place])   Anemia   Pancytopenia ([place])   Encounter for competency evaluation  Total Time spent with patient: 30 minutes  Subjective:   Sarah Serrano is a 43 y.o. female patient is clearer today and less disorganized.  Patient upset because someone on the staff told her she was deemed "incompetent" which upset her greatly.  This provider explained to her this was a legal, court ordered word and she was not incompetent.  Did explain to her what capacity consisted of and how the goal is to stabilize her medically or transfer her to another facility for treatment for her blood issues.  She reports she knows she needs help medically.  07/12/19: Patient seen and evaluated by this provider in person.  She was awake, alert, and sitting on the side of her bed in her pocketbook.  Today, she is tearful and anxious.  Upset by someone telling her she was incompetent.  Patient was calmed by explaining capacity vs competency.  RN reported the patient became unresponsive last night after a visitor came and she went to the bathroom.  Revived with Narcan, appears her friend may have given her a substance.  Patient reported to this provider that she told them she wanted to be off the IV morphine and switch to oral medications so she could go home with the medication, long history of polysubstance abuse noted from her chart.  Most recently, opiate dependence is noted in outpatient.   07/12/19: Patient seen and evaluated by this provider in person.  She was awake, alert, and sitting up in bed.  Her nurse was explaining why she was needing to transfer and how she would still be taken care of.  She felt better "when people  communicate to me."  No suicidal/homicidal ideations, hallucinations, or withdrawal symptoms.  She is compliant with her medications and is less disorganized today, processing information better.  07/11/19: Patient seen and evaluated by this provider in person.  She was asleep but aroused when awakened.  Kept her eyes closed as she spoke.  Discussed needing to leave to see her mother without any regard for the need to address her medical needs.  She started out sounding logical and became disorganized quickly in conversation.  Appeared tired and drowsy.  Denies suicidal/homicidal ideations, hallucinations but clearly not at her baseline without medical capacity at this time nor a few days ago when she was assessed for capacity.  IVC will be placed to prevent her from leaving the hospital and potentially dying from her medical issues.  HPI:  Per Dr. Cinda Quest: 43 y.o.femalewho reports epigastric pain starting last night with a lot of vomiting. This pain radiates to her back. She gets very sweaty when she vomits. Nurse reported she was soaking wet when she began vomiting in the emergency room. Pain comes up into the chest somewhat. Makes her short of breath. It hurts a little bit when she breathes as well. Pain is moderately severe deep and achy. She is not complaining of fever. No blood in the vomit. No diarrhea. The pain makes her short of breath.  07/09/19:  Patient is also diagnosed with severe thrombocytopenia and suspected bone marrow suppression.  Patient was seen by me face-to-face.  I was requested  to assess the capacity of this patient for medical decision-making.  I assessed the patient yesterday and she was completely alert and oriented.  She appeared to be slightly irritated due to the questioning that she received but answered all questions appropriately.  Today the patient has disorganized thought, memory loss, confusion, and it is been reported that the patient has been talking to  herself when she is in the room alone.  Nursing staff reported that at 1 time she got out of bed and went looking for someone that she stated had been in her room and there was no one in there.  I was also contacted by secure chat due to the patient's thrombocytopenia and that the possibility of her sertraline can worsen thrombocytopenia.  Sertraline was discontinued at this time but recommended to continue the Seroquel at 300 mg p.o. twice daily.  At this time the patient does not meet criteria for capacity and if needed due to concerns for patient's confusion and her medical condition she can be IVC'd if needed  Past Psychiatric History: Bipolar disorder  Risk to Self:   Risk to Others:   Prior Inpatient Therapy:   Prior Outpatient Therapy:    Past Medical History:  Past Medical History:  Diagnosis Date  . Alcohol abuse   . Bipolar 1 disorder ([place])   . Substance abuse [facility])     Past Surgical History:  Procedure Laterality Date  . NO PAST SURGERIES     Family History:  Family History  Problem Relation Age of Onset  . Bipolar disorder Mother    Family Psychiatric  History: See above Social History:  Social History   Substance and Sexual Activity  Alcohol Use Yes     Social History   Substance and Sexual Activity  Drug Use Yes    Social History   Socioeconomic History  . Marital status: Single    Spouse name: Not on file  . Number of children: Not on file  . Years of education: Not on file  . Highest education level: Not on file  Occupational History  . Not on file  Social Needs  . Financial resource strain: Not on file  . Food insecurity    Worry: Not on file    Inability: Not on file  . Transportation needs    Medical: Not on file    Non-medical: Not on file  Tobacco Use  . Smoking status: Current Every Day Smoker    Packs/day: 0.50    Types: Cigarettes  . Smokeless tobacco: Never Used  Substance and Sexual Activity  . Alcohol use: Yes  . Drug use: Yes   . Sexual activity: Not on file  Lifestyle  . Physical activity    Days per week: Not on file    Minutes per session: Not on file  . Stress: Not on file  Relationships  . Social Herbalist on phone: Not on file    Gets together: Not on file    Attends religious service: Not on file    Active member of club or organization: Not on file    Attends meetings of clubs or organizations: Not on file    Relationship status: Not on file  Other Topics Concern  . Not on file  Social History Narrative  . Not on file   Additional Social History:    Allergies:  No Known Allergies  Labs:  Results for orders placed or performed during the hospital encounter of  07/06/19 (from the past 48 hour(s))  Prepare fresh frozen plasma     Status: None   Collection Time: 07/11/19  3:01 PM  Result Value Ref Range   Unit Number [id number]    Blood Component Type THAWED PLASMA    Unit division 00    Status of Unit ISSUED,FINAL    Transfusion Status      OK TO TRANSFUSE Performed at [facility], [address] [place], [place] [address]   CBC with Differential/Platelet     Status: Abnormal   Collection Time: 07/12/19  8:15 AM  Result Value Ref Range   WBC 8.3 4.0 - 10.5 K/uL   RBC 2.62 (L) 3.87 - 5.11 MIL/uL   Hemoglobin 8.4 (L) 12.0 - 15.0 g/dL    Comment: REPEATED TO VERIFY   HCT 24.4 (L) 36.0 - 46.0 %   MCV 93.1 80.0 - 100.0 fL   MCH 32.1 26.0 - 34.0 pg   MCHC 34.4 30.0 - 36.0 g/dL   RDW 17.3 (H) 11.5 - 15.5 %   Platelets 52 (L) 150 - 400 K/uL    Comment: Immature Platelet Fraction may be clinically indicated, consider ordering this additional test [id number]    nRBC 1.4 (H) 0.0 - 0.2 %   Neutrophils Relative % 54 %   Neutro Abs 4.5 1.7 - 7.7 K/uL   Lymphocytes Relative 31 %   Lymphs Abs 2.6 0.7 - 4.0 K/uL   Monocytes Relative 10 %   Monocytes Absolute 0.8 0.1 - 1.0 K/uL   Eosinophils Relative 0 %   Eosinophils Absolute 0.0 0.0 - 0.5 K/uL   Basophils  Relative 0 %   Basophils Absolute 0.0 0.0 - 0.1 K/uL   WBC Morphology MORPHOLOGY UNREMARKABLE    Smear Review PLATELETS APPEAR DECREASED    Immature Granulocytes 5 %   Abs Immature Granulocytes 0.44 (H) 0.00 - 0.07 K/uL   Schistocytes PRESENT    Tammy Sours Bodies PRESENT    Polychromasia PRESENT    Target Cells PRESENT     Comment: Performed at [facility], [address] [place], [place] [address]  Lipase, blood     Status: Abnormal   Collection Time: 07/12/19  8:15 AM  Result Value Ref Range   Lipase 178 (H) 11 - 51 U/L    Comment: Performed at [facility], [address] [place], [place] [id number]  Basic metabolic panel     Status: Abnormal   Collection Time: 07/12/19  8:15 AM  Result Value Ref Range   Sodium 141 135 - 145 mmol/L   Potassium 3.0 (L) 3.5 - 5.1 mmol/L   Chloride 108 98 - 111 mmol/L   CO2 22 22 - 32 mmol/L   Glucose, Bld 94 70 - 99 mg/dL   BUN 6 6 - 20 mg/dL   Creatinine, Ser 0.58 0.44 - 1.00 mg/dL   Calcium 9.1 8.9 - 10.3 mg/dL   GFR calc non Af Amer >60 >60 mL/min   GFR calc Af Amer >60 >60 mL/min   Anion gap 11 5 - 15    Comment: Performed at [facility], [address] [place], [place] [address]  Magnesium     Status: None   Collection Time: 07/12/19  8:15 AM  Result Value Ref Range   Magnesium 1.9 1.7 - 2.4 mg/dL    Comment: Performed at [facility], [address] [place], [place] [address]  Reticulocytes     Status: Abnormal   Collection  Time: 07/12/19  8:15 AM  Result Value Ref Range   Retic Ct Pct 5.2 (H) 0.4 - 3.1 %   RBC. 2.62 (L) 3.87 - 5.11 MIL/uL   Retic Count, Absolute 137.0 19.0 - 186.0 K/uL   Immature Retic Fract 46.7 (H) 2.3 - 15.9 %    Comment: Performed at [facility], [address] [place], [place] [address]  Lactate dehydrogenase     Status: Abnormal   Collection Time: 07/12/19  8:15 AM  Result Value Ref Range   LDH 348 (H) 98 - 192 U/L    Comment: Performed at  [facility], [address] [place], [place] [address]  Hepatic function panel     Status: Abnormal   Collection Time: 07/12/19  8:15 AM  Result Value Ref Range   Total Protein 7.0 6.5 - 8.1 g/dL   Albumin 3.1 (L) 3.5 - 5.0 g/dL   AST 65 (H) 15 - 41 U/L   ALT 57 (H) 0 - 44 U/L   Alkaline Phosphatase 49 38 - 126 U/L   Total Bilirubin 0.7 0.3 - 1.2 mg/dL   Bilirubin, Direct 0.2 0.0 - 0.2 mg/dL   Indirect Bilirubin 0.5 0.3 - 0.9 mg/dL    Comment: Performed at [facility], [address] [place], [place] [address]  Fibrin derivatives D-[facility] only)     Status: Abnormal   Collection Time: 07/12/19  8:15 AM  Result Value Ref Range   Fibrin derivatives D-dimer ([facility]) 1,364.95 (H) 0.00 - 499.00 ng/mL (FEU)    Comment: (NOTE) <> Exclusion of Venous Thromboembolism (VTE) - OUTPATIENT ONLY   (Emergency Department or [place])   0-499 ng/ml (FEU): With a low to intermediate pretest probability                      for VTE this test result excludes the diagnosis                      of VTE.   >499 ng/ml (FEU) : VTE not excluded; additional work up for VTE is                      required. <> Testing on Inpatients and Evaluation of Disseminated Intravascular   Coagulation (DIC) Reference Range:   0-499 ng/ml (FEU) Performed at [facility], [address] [place], [place] [address]   Phosphorus     Status: None   Collection Time: 07/12/19  8:15 AM  Result Value Ref Range   Phosphorus 4.5 2.5 - 4.6 mg/dL    Comment: Performed at [facility], [address] [place], [place] [address]  Urine Drug Screen, Qualitative ([place] only)     Status: Abnormal   Collection Time: 07/12/19  9:32 PM  Result Value Ref Range   Tricyclic, Ur Screen POSITIVE (A) NONE DETECTED   Amphetamines, Ur Screen NONE DETECTED NONE DETECTED   MDMA (Ecstasy)Ur Screen NONE DETECTED NONE DETECTED   Cocaine Metabolite,Ur [place] NONE DETECTED NONE DETECTED   Opiate, Ur Screen  POSITIVE (A) NONE DETECTED   Phencyclidine (PCP) Ur S NONE DETECTED NONE DETECTED   Cannabinoid 50 Ng, Ur [place] NONE DETECTED NONE DETECTED   Barbiturates, Ur Screen NONE DETECTED NONE DETECTED   Benzodiazepine, Ur Scrn POSITIVE (A) NONE DETECTED   Methadone Scn, Ur NONE DETECTED NONE DETECTED    Comment: (NOTE) Tricyclics + metabolites, urine    Cutoff 1000 ng/mL Amphetamines +  metabolites, urine  Cutoff 1000 ng/mL MDMA (Ecstasy), urine              Cutoff 500 ng/mL Cocaine Metabolite, urine          Cutoff 300 ng/mL Opiate + metabolites, urine        Cutoff 300 ng/mL Phencyclidine (PCP), urine         Cutoff 25 ng/mL Cannabinoid, urine                 Cutoff 50 ng/mL Barbiturates + metabolites, urine  Cutoff 200 ng/mL Benzodiazepine, urine              Cutoff 200 ng/mL Methadone, urine                   Cutoff 300 ng/mL The urine drug screen provides only a preliminary, unconfirmed analytical test result and should not be used for non-medical purposes. Clinical consideration and professional judgment should be applied to any positive drug screen result due to possible interfering substances. A more specific alternate chemical method must be used in order to obtain a confirmed analytical result. Gas chromatography / mass spectrometry (GC/MS) is the preferred confirmat ory method. Performed at [facility], [address] [place], [place] [id number]   Basic metabolic panel     Status: Abnormal   Collection Time: 07/13/19  4:20 AM  Result Value Ref Range   Sodium 139 135 - 145 mmol/L   Potassium 4.0 3.5 - 5.1 mmol/L   Chloride 109 98 - 111 mmol/L   CO2 21 (L) 22 - 32 mmol/L   Glucose, Bld 112 (H) 70 - 99 mg/dL   BUN 11 6 - 20 mg/dL   Creatinine, Ser 0.68 0.44 - 1.00 mg/dL   Calcium 9.0 8.9 - 10.3 mg/dL   GFR calc non Af Amer >60 >60 mL/min   GFR calc Af Amer >60 >60 mL/min   Anion gap 9 5 - 15    Comment: Performed at [facility], [address]  [employer] [place], [place] [address]  Magnesium     Status: None   Collection Time: 07/13/19  4:20 AM  Result Value Ref Range   Magnesium 2.1 1.7 - 2.4 mg/dL    Comment: Performed at [facility], [address] [place], [place] [address]  CBC     Status: Abnormal   Collection Time: 07/13/19  4:20 AM  Result Value Ref Range   WBC 10.5 4.0 - 10.5 K/uL   RBC 2.80 (L) 3.87 - 5.11 MIL/uL   Hemoglobin 8.9 (L) 12.0 - 15.0 g/dL   HCT 26.5 (L) 36.0 - 46.0 %   MCV 94.6 80.0 - 100.0 fL   MCH 31.8 26.0 - 34.0 pg   MCHC 33.6 30.0 - 36.0 g/dL   RDW 17.2 (H) 11.5 - 15.5 %   Platelets 132 (L) 150 - 400 K/uL    Comment: REPEATED TO VERIFY   nRBC 1.1 (H) 0.0 - 0.2 %    Comment: Performed at [facility], [address] [place], [place] [address]  Lipase, blood     Status: Abnormal   Collection Time: 07/13/19  4:20 AM  Result Value Ref Range   Lipase 131 (H) 11 - 51 U/L    Comment: Performed at [facility], [address] [place], [place] [address]    Current Facility-Administered Medications  Medication Dose Route Frequency Provider Last Rate Last Dose  . acetaminophen (TYLENOL) tablet 650 mg  650 mg Oral Q6H PRN Harrie Foreman, MD   650 mg at 07/09/19 [id number]   Or  . acetaminophen (TYLENOL) suppository 650 mg  650 mg Rectal Q6H PRN Harrie Foreman, MD   650 mg at 07/07/19 0506  . chlorpheniramine-HYDROcodone (TUSSIONEX) 10-8 MG/5ML suspension 5 mL  5 mL Oral QHS PRN Harrie Foreman, MD      . cyclobenzaprine (FLEXERIL) tablet 10 mg  10 mg Oral BID PRN Nicholes Mango, MD   10 mg at 07/09/19 1457  . dexamethasone (DECADRON) 10 MG/ML injection for Pediatric ORAL use 40 mg  40 mg Oral Daily Sindy Guadeloupe, MD   40 mg at 07/13/19 1137  . folic acid (FOLVITE) tablet 1 mg  1 mg Oral Daily Harrie Foreman, MD   1 mg at 07/13/19 1136  . gabapentin (NEURONTIN) capsule 300 mg  300 mg Oral BID PRN Nicholes Mango, MD   300 mg at 07/09/19 2248  . iohexol (OMNIPAQUE) 9 MG/ML oral  solution 500 mL  500 mL Oral BID PRN Harrie Foreman, MD   500 mL at 07/06/19 1745  . iohexol (OMNIPAQUE) 9 MG/ML oral solution 500 mL  500 mL Oral Once PRN Sindy Guadeloupe, MD   500 mL at 07/09/19 0859  . levETIRAcetam (KEPPRA) IVPB 500 mg/100 mL premix  500 mg Intravenous Q12H Alexis Goodell, MD 400 mL/hr at 07/13/19 1239 500 mg at 07/13/19 1239  . LORazepam (ATIVAN) injection 0-4 mg  0-4 mg Intravenous Q6H PRN Fritzi Mandes, MD   1 mg at 07/13/19 0534  . metoCLOPramide (REGLAN) injection 5 mg  5 mg Intravenous Q6H PRN Lance Coon, MD   5 mg at 07/12/19 0817  . metoprolol tartrate (LOPRESSOR) injection 5 mg  5 mg Intravenous Q4H PRN Gouru, Aruna, MD   5 mg at 07/09/19 0554  . metoprolol tartrate (LOPRESSOR) tablet 50 mg  50 mg Oral BID Gouru, Aruna, MD   50 mg at 07/13/19 1136  . multivitamin with minerals tablet 1 tablet  1 tablet Oral Daily Harrie Foreman, MD   1 tablet at 07/13/19 1136  . naloxone [facility]) nasal spray 4 mg/0.1 mL  1 spray Nasal Once Nicholes Mango, MD   Stopped at 07/07/19 0830  . nicotine (NICODERM CQ - dosed in mg/24 hours) patch 14 mg  14 mg Transdermal Daily Gouru, Aruna, MD   14 mg at 07/13/19 1138  . nitroGLYCERIN (NITROSTAT) SL tablet 0.4 mg  0.4 mg Sublingual Q5 min PRN Gouru, Aruna, MD   0.4 mg at 07/07/19 0815  . ondansetron (ZOFRAN) tablet 4 mg  4 mg Oral Q6H PRN Harrie Foreman, MD       Or  . ondansetron [facility]) injection 4 mg  4 mg Intravenous Q6H PRN Harrie Foreman, MD   4 mg at 07/08/19 0204  . oxyCODONE-acetaminophen (PERCOCET/ROXICET) 5-325 MG per tablet 1 tablet  1 tablet Oral Q4H PRN Fritzi Mandes, MD   1 tablet at 07/13/19 0002  . pantoprazole (PROTONIX) EC tablet 20 mg  20 mg Oral Daily Sindy Guadeloupe, MD   20 mg at 07/13/19 1137  . prochlorperazine (COMPAZINE) injection 10 mg  10 mg Intravenous Q6H PRN Harrie Foreman, MD   10 mg at 07/09/19 0202  . QUEtiapine (SEROQUEL) tablet 300 mg  300 mg Oral BID Money, Lowry Ram, FNP   300 mg at  07/13/19 1137  . sodium chloride flush (NS) 0.9 % injection 10-40 mL  10-40 mL  Intracatheter Q12H Nicholes Mango, MD   10 mL at 07/13/19 1238  . sodium chloride flush (NS) 0.9 % injection 10-40 mL  10-40 mL Intracatheter PRN Gouru, Aruna, MD      . thiamine (VITAMIN B-1) tablet 100 mg  100 mg Oral Daily Harrie Foreman, MD   100 mg at 07/13/19 1136    Musculoskeletal: Strength & Muscle Tone: within normal limits Gait & Station: Patient remained in bed during evaluation Patient leans: N/A  Psychiatric Specialty Exam: Physical Exam  Nursing note and vitals reviewed. Constitutional: She is oriented to person, place, and time. She appears well-developed and well-nourished.  HENT:  Head: Normocephalic.  Neck: Normal range of motion.  Respiratory: Effort normal.  Musculoskeletal: Normal range of motion.  Neurological: She is alert and oriented to person, place, and time.  Psychiatric: Her speech is normal and behavior is normal. Thought content normal. Her mood appears anxious. Cognition and memory are impaired. She expresses inappropriate judgment.    Review of Systems  Constitutional: Negative.   HENT: Negative.   Eyes: Negative.   Respiratory: Negative.   Cardiovascular: Negative.   Gastrointestinal: Negative.   Genitourinary: Negative.   Musculoskeletal: Positive for back pain.  Skin: Negative.   Neurological: Negative.   Endo/Heme/Allergies: Negative.   Psychiatric/Behavioral: Positive for memory loss. The patient is nervous/anxious.     Blood pressure 125/73, pulse (!) 104, temperature 97.9 F (36.6 C), temperature source Oral, resp. rate 18, height 5\' 4"  (1.626 m), weight 74.2 kg, last menstrual period 07/05/2019, SpO2 98 %.Body mass index is 28.08 kg/m.  General Appearance: Casual  Eye Contact:  Good  Speech:  Regular  Volume:  Normal  Mood:  Anxious  Affect:  Flat  Thought Process:  Disorganized at times  Orientation:  Other:  self  Thought Content:  Delusions at  times  Suicidal Thoughts:  No  Homicidal Thoughts:  No  Memory:  Fair  Judgement:  Impaired  Insight:  Lacking  Psychomotor Activity:  Decreased  Concentration:  Concentration: Poor  Recall:  Poor  Fund of Knowledge:  Fair  Language:  Poor  Akathisia:  No  Handed:  Right  AIMS (if indicated):     Assets:  Financial Resources/Insurance Housing Social Support  ADL's:  Impaired  Cognition:  Impaired,  Moderate  Sleep:        Treatment Plan Summary: Patient is confused and does not currently have capacity to make medical decisions  Bipolar affective disorder: Continue Seroquel 300 mg PO BID - IVC paperwork in place  Disposition: No evidence of imminent risk to self or others at present.   Patient does not meet criteria for psychiatric inpatient admission.  Waylan Boga, NP 07/13/2019 2:37 PM   Case discussed with Dr. Reita Cliche.  Agree with plan as outlined above.

## 2019-07-13 NOTE — Progress Notes (Signed)
[facility] Hematology/Oncology Progress Note  Date of admission: 07/06/2019  Hospital day:  07/13/2019  Chief Complaint: Sarah Serrano is a 43 y.o. female who was admitted with pancreatitis.  Subjective:  Sleeping today.  Asleep when entering room and falls asleep eating lunch.  She states food is "delish".  She notes minor pain.  Social History: The patient is accompanied by a sitter today and later with her nurse.  Allergies: No Known Allergies  Scheduled Medications: . dexamethasone  40 mg Oral Daily  . folic acid  1 mg Oral Daily  . levETIRAcetam  500 mg Oral BID  . metoprolol tartrate  50 mg Oral BID  . multivitamin with minerals  1 tablet Oral Daily  . naloxone  1 spray Nasal Once  . nicotine  14 mg Transdermal Daily  . pantoprazole  20 mg Oral Daily  . QUEtiapine  300 mg Oral BID  . sodium chloride flush  10-40 mL Intracatheter Q12H  . thiamine  100 mg Oral Daily    Review of Systems: GENERAL:  Patient denies any complaints.Drifts off to sleep while talking to her. PERFORMANCE STATUS (ECOG):  2 HEENT: Patient denies any visual changes, runny nose, sore throat, mouth sores or tenderness. Lungs:  No shortness of breath or cough.  No hemoptysis. Cardiac:  No chest pain, palpitations, orthopnea, or PND. GI:  Notes some abdominal discomfort.  Able to eat.  No nausea, vomiting, diarrhea, constipation, melena or hematochezia. GU:  No urgency, frequency, dysuria, or hematuria. Musculoskeletal:  Back pain. Patient does not complain of bone marrow biopsy site pain. Extremities:  No pain or swelling. Skin:  No rashes or skin changes. Neuro:  No interval seizures.  Sleepy.  No  Focal numbness or weakness, balance or coordination issues. Endocrine:  No diabetes, thyroid issues, hot flashes or night sweats. Psych:  Denies any concerns.  Bipolar disorder.  Denies crashing after manic periods.  Pain:  Notes back and some abdominal discomfort. Review of  systems:  All other systems reviewed and found to be negative.  Physical Exam: Blood pressure 106/78, pulse 97, temperature 98 F (36.7 C), temperature source Oral, resp. rate 18, height 5' 4"  (1.626 m), weight 163 lb 9.3 oz (74.2 kg), last menstrual period 07/05/2019, SpO2 97 %.  GENERAL:  Well developed, well nourished, initially sleeping, but arousable.  Falls asleep while eating.   MENTAL STATUS:  Alert and oriented to person, place and time. HEAD:  Braided hair.  Normocephalic, atraumatic, face symmetric, no Cushingoid features. EYES:  Brown eyes.  Pupils small equal round and reactive to light and accomodation.  No conjunctivitis or scleral icterus. ENT:  Oropharynx clear without lesion. Small amount of tongue trauma anteriorly, healing well. Mucous membranes moist.  RESPIRATORY:  Clear to auscultation without rales, wheezes or rhonchi. CARDIOVASCULAR:  Regular rate and rhythm without murmur, rub or gallop. ABDOMEN:  Soft, minimally tender, without guarding or rebound tenderness.  Active bowel sounds and no hepatosplenomegaly.  No masses. SKIN:  No erythema.  No rashes, ulcers or lesions. EXTREMITIES: Right upper extremity PIC line.  No edema, no skin discoloration or tenderness.  No palpable cords. NEUROLOGICAL: Sleepy, but arousable.  Denies taking any medication.  Knows President.  Counting backwards by 3s (100, 97, 93, 90).  Remembers 3 of 3 words immediately and 1 of 3 words in 5 minutes.  Cooperative.  Moves all 4 extremities. PSYCH:  Appropriate when awake.   Results for orders placed or performed during the hospital  encounter of 07/06/19 (from the past 48 hour(s))  CBC with Differential/Platelet     Status: Abnormal   Collection Time: 07/12/19  8:15 AM  Result Value Ref Range   WBC 8.3 4.0 - 10.5 K/uL   RBC 2.62 (L) 3.87 - 5.11 MIL/uL   Hemoglobin 8.4 (L) 12.0 - 15.0 g/dL    Comment: REPEATED TO VERIFY   HCT 24.4 (L) 36.0 - 46.0 %   MCV 93.1 80.0 - 100.0 fL   MCH 32.1 26.0  - 34.0 pg   MCHC 34.4 30.0 - 36.0 g/dL   RDW 17.3 (H) 11.5 - 15.5 %   Platelets 52 (L) 150 - 400 K/uL    Comment: Immature Platelet Fraction may be clinically indicated, consider ordering this additional test [id number]    nRBC 1.4 (H) 0.0 - 0.2 %   Neutrophils Relative % 54 %   Neutro Abs 4.5 1.7 - 7.7 K/uL   Lymphocytes Relative 31 %   Lymphs Abs 2.6 0.7 - 4.0 K/uL   Monocytes Relative 10 %   Monocytes Absolute 0.8 0.1 - 1.0 K/uL   Eosinophils Relative 0 %   Eosinophils Absolute 0.0 0.0 - 0.5 K/uL   Basophils Relative 0 %   Basophils Absolute 0.0 0.0 - 0.1 K/uL   WBC Morphology MORPHOLOGY UNREMARKABLE    Smear Review PLATELETS APPEAR DECREASED    Immature Granulocytes 5 %   Abs Immature Granulocytes 0.44 (H) 0.00 - 0.07 K/uL   Schistocytes PRESENT    Tammy Sours Bodies PRESENT    Polychromasia PRESENT    Target Cells PRESENT     Comment: Performed at [facility], [address] [place], [place] [address]  Lipase, blood     Status: Abnormal   Collection Time: 07/12/19  8:15 AM  Result Value Ref Range   Lipase 178 (H) 11 - 51 U/L    Comment: Performed at [facility], [address] [place], [place] [address]  Basic metabolic panel     Status: Abnormal   Collection Time: 07/12/19  8:15 AM  Result Value Ref Range   Sodium 141 135 - 145 mmol/L   Potassium 3.0 (L) 3.5 - 5.1 mmol/L   Chloride 108 98 - 111 mmol/L   CO2 22 22 - 32 mmol/L   Glucose, Bld 94 70 - 99 mg/dL   BUN 6 6 - 20 mg/dL   Creatinine, Ser 0.58 0.44 - 1.00 mg/dL   Calcium 9.1 8.9 - 10.3 mg/dL   GFR calc non Af Amer >60 >60 mL/min   GFR calc Af Amer >60 >60 mL/min   Anion gap 11 5 - 15    Comment: Performed at [facility], [address], [place], [place] [address]  Magnesium     Status: None   Collection Time: 07/12/19  8:15 AM  Result Value Ref Range   Magnesium 1.9 1.7 - 2.4 mg/dL    Comment: Performed at [facility], [address] [place], [place]  [address]  Reticulocytes     Status: Abnormal   Collection Time: 07/12/19  8:15 AM  Result Value Ref Range   Retic Ct Pct 5.2 (H) 0.4 - 3.1 %   RBC. 2.62 (L) 3.87 - 5.11 MIL/uL   Retic Count, Absolute 137.0 19.0 - 186.0 K/uL   Immature Retic Fract 46.7 (H) 2.3 - 15.9 %    Comment: Performed at [facility], [address], [place], [place] [address]  Lactate dehydrogenase  Status: Abnormal   Collection Time: 07/12/19  8:15 AM  Result Value Ref Range   LDH 348 (H) 98 - 192 U/L    Comment: Performed at [facility], [address] [place], [place] [address]  Hepatic function panel     Status: Abnormal   Collection Time: 07/12/19  8:15 AM  Result Value Ref Range   Total Protein 7.0 6.5 - 8.1 g/dL   Albumin 3.1 (L) 3.5 - 5.0 g/dL   AST 65 (H) 15 - 41 U/L   ALT 57 (H) 0 - 44 U/L   Alkaline Phosphatase 49 38 - 126 U/L   Total Bilirubin 0.7 0.3 - 1.2 mg/dL   Bilirubin, Direct 0.2 0.0 - 0.2 mg/dL   Indirect Bilirubin 0.5 0.3 - 0.9 mg/dL    Comment: Performed at [facility], [address] [place], [place] [address]  Fibrin derivatives D-Dimer [facility] only)     Status: Abnormal   Collection Time: 07/12/19  8:15 AM  Result Value Ref Range   Fibrin derivatives D-dimer ([facility]) 1,364.95 (H) 0.00 - 499.00 ng/mL (FEU)    Comment: (NOTE) <> Exclusion of Venous Thromboembolism (VTE) - OUTPATIENT ONLY   (Emergency Department or [place])   0-499 ng/ml (FEU): With a low to intermediate pretest probability                      for VTE this test result excludes the diagnosis                      of VTE.   >499 ng/ml (FEU) : VTE not excluded; additional work up for VTE is                      required. <> Testing on Inpatients and Evaluation of Disseminated Intravascular   Coagulation (DIC) Reference Range:   0-499 ng/ml (FEU) Performed at [facility], [address] [place], [place] [address]   Phosphorus     Status: None   Collection Time: 07/12/19  8:15  AM  Result Value Ref Range   Phosphorus 4.5 2.5 - 4.6 mg/dL    Comment: Performed at [facility], [address], [place], [place] [address]  Urine Drug Screen, Qualitative ([place] only)     Status: Abnormal   Collection Time: 07/12/19  9:32 PM  Result Value Ref Range   Tricyclic, Ur Screen POSITIVE (A) NONE DETECTED   Amphetamines, Ur Screen NONE DETECTED NONE DETECTED   MDMA (Ecstasy)Ur Screen NONE DETECTED NONE DETECTED   Cocaine Metabolite,Ur [place] NONE DETECTED NONE DETECTED   Opiate, Ur Screen POSITIVE (A) NONE DETECTED   Phencyclidine (PCP) Ur S NONE DETECTED NONE DETECTED   Cannabinoid 50 Ng, Ur [place] NONE DETECTED NONE DETECTED   Barbiturates, Ur Screen NONE DETECTED NONE DETECTED   Benzodiazepine, Ur Scrn POSITIVE (A) NONE DETECTED   Methadone Scn, Ur NONE DETECTED NONE DETECTED    Comment: (NOTE) Tricyclics + metabolites, urine    Cutoff 1000 ng/mL Amphetamines + metabolites, urine  Cutoff 1000 ng/mL MDMA (Ecstasy), urine              Cutoff 500 ng/mL Cocaine Metabolite, urine          Cutoff 300 ng/mL Opiate + metabolites, urine        Cutoff 300 ng/mL Phencyclidine (PCP), urine         Cutoff 25 ng/mL Cannabinoid, urine  Cutoff 50 ng/mL Barbiturates + metabolites, urine  Cutoff 200 ng/mL Benzodiazepine, urine              Cutoff 200 ng/mL Methadone, urine                   Cutoff 300 ng/mL The urine drug screen provides only a preliminary, unconfirmed analytical test result and should not be used for non-medical purposes. Clinical consideration and professional judgment should be applied to any positive drug screen result due to possible interfering substances. A more specific alternate chemical method must be used in order to obtain a confirmed analytical result. Gas chromatography / mass spectrometry (GC/MS) is the preferred confirmat ory method. Performed at [facility], [address] [place], [place] [address]   Basic metabolic  panel     Status: Abnormal   Collection Time: 07/13/19  4:20 AM  Result Value Ref Range   Sodium 139 135 - 145 mmol/L   Potassium 4.0 3.5 - 5.1 mmol/L   Chloride 109 98 - 111 mmol/L   CO2 21 (L) 22 - 32 mmol/L   Glucose, Bld 112 (H) 70 - 99 mg/dL   BUN 11 6 - 20 mg/dL   Creatinine, Ser 0.68 0.44 - 1.00 mg/dL   Calcium 9.0 8.9 - 10.3 mg/dL   GFR calc non Af Amer >60 >60 mL/min   GFR calc Af Amer >60 >60 mL/min   Anion gap 9 5 - 15    Comment: Performed at [facility], [address] [place], [place] [address]  Magnesium     Status: None   Collection Time: 07/13/19  4:20 AM  Result Value Ref Range   Magnesium 2.1 1.7 - 2.4 mg/dL    Comment: Performed at [facility], [address] [place], [place] [address]  CBC     Status: Abnormal   Collection Time: 07/13/19  4:20 AM  Result Value Ref Range   WBC 10.5 4.0 - 10.5 K/uL   RBC 2.80 (L) 3.87 - 5.11 MIL/uL   Hemoglobin 8.9 (L) 12.0 - 15.0 g/dL   HCT 26.5 (L) 36.0 - 46.0 %   MCV 94.6 80.0 - 100.0 fL   MCH 31.8 26.0 - 34.0 pg   MCHC 33.6 30.0 - 36.0 g/dL   RDW 17.2 (H) 11.5 - 15.5 %   Platelets 132 (L) 150 - 400 K/uL    Comment: REPEATED TO VERIFY   nRBC 1.1 (H) 0.0 - 0.2 %    Comment: Performed at [facility], [address] [place], [place] [address]  Lipase, blood     Status: Abnormal   Collection Time: 07/13/19  4:20 AM  Result Value Ref Range   Lipase 131 (H) 11 - 51 U/L    Comment: Performed at [facility], [address] [place], [place] [address]   No results found.  Assessment:  Sarah Serrano is a 43 y.o. female with pancreatitis complicated by thrombocytopenia.  Patient with apparent alcohol withdrawal seizure per chart.  None since 07/07/2019.  Head MRI negative.  Patient on Keppra.  Peripheral smear yesterday and today reveals only rare fragmented RBC.  Symptomatically, she is sleepy today.  She has mild abdominal discomfort.  Hemoglobin is 8.9 and platelet count  132,000.  Plan: 1. Normocytic anemia  Hemoglobin on admission was 11.7 with nadir of 7.1 on 07/07/2019.    She received 1 unit of PRBCs on 07/07/2019 with anticipated improvement to 8.3.  Suspect hemoglobin of 11.5 on 07/11/2019 was spurious.  Hemoglobin yesterday was 8.4 and 8.9 today (improving).  Reticulocyte count has improved from 0.5 to 2.0 to 5.2% yesterday felt secondary to recovering marrow.   (WBC 6,800 to 2,900 to 8,300-10,500).  No evidence of hemolysis   Haptoglobin on 07/07/2019 was normal.   LDH modestly elevated (456 to 401 to 325 to 339 to 348).   Bilirubin has remained normal.   Peripheral smear review yesterday and today revealed rare fragmented RBC and no spherocytes.   No evidence of bleeding   Stool guaiac negative.   Abdomen/pelvic CT on 09/06 and 09/09 revealed no retroperitoneal bleed.  Patient B12 and folate deficient   B12 and folate supplementation initiated  2.   Thrombocytopenia  Patient's baseline platelet count 387,000 in 06/2014 with an admission platelet count of 12,000.  Platelet count has been in the single digits from 07/07/2019 to 07/09/2019.  She has received 3 platelet transfusions (09/07, 09/08, and 09/09).   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  She received 80 mg of prednisone on 07/09/2019.  She began 4 days of Decadron 54m/day on 07/11/2019 (today is day 3 of 4).  Platelet count today 132,000.  Typical improvement with steroids for ITP in 3 days.  No evidence of DIC (PT/PTT normal on 07/11/2019)  Clonidine and Zoloft discontinued as felt could contribute to thrombocytopenia.  Consideration of some component of alcohol suppression.  Doubt TTP   No MAHA, fever, renal insufficiency or neurologic changes (seizure apparently related to alcohol withdrawal).   Thrombocytopenia improving.   Patient received 1 unit FFP on 07/11/2019.   Peripheral smear today reveals rare fragmented RBC (typically >= 2 per HPF).   Initial PLASMIC  score 6; Current PLASMIC score 5 (int risk).   ADAMTS13 was 41 (> 66.8%).    ADAMTS13 typically < 10% in TTP.    Pancreatitis associated TTP not always associated with a low ADAMTS13.    ADAMTS13 can be low in sepsis, inflammation such as pancreatitis, liver disease, uremia.    [facility]  Reviewed improvement in labs.  No plan to transfer.  Follow-up bone marrow performed on 07/10/2019. 3.  Leukopenia, resolved  Acute drop in WBC on 07/09/2019 - 07/11/2019.  WBCs hypersegmented c/w B12 and folate deficiency.  Marrow appears to have recovered. 4.   Neurologic  Patient sleepy today.  Spoke with nurse.  Yesterday patient was interactive and then became sleepy after her boyfriend left.  She received several doses of pain medications and anxiolytics yesterday.  Improved after Narcan yesterday.  Per nursing, she only received Ativan early this morning. No pain medications today.   Discussed with nursing, Dr LAlexis Goodell and Dr PPosey Pronto   Discussed my conversation with Dr RDoy Minceto Dr PPosey Pronto  Ongoing close observation with sitter, nursing, and staff.    Medications affecting CNS limited.   Discussed meals only if awake and to prevent aspiration. 5.   Disposition  Continue to monitor closely.  Contact MD if any concerning issues.   MLequita Asal MD  07/13/2019, 3:51 PM

## 2019-07-13 NOTE — Progress Notes (Signed)
The patient was resting comfortably this morning without any problems overnight. The platelets are up today.  The lipase was slightly increased yesterday. Nothing further to do from a GI point of view.  I will sign off.  Please call if any further GI concerns or questions.  We would like to thank you for the opportunity to participate in the care of Sarah Serrano.

## 2019-07-13 NOTE — Progress Notes (Signed)
Consulting MD (Dr. Alvia Grove) was at the bedside with this RN. It was noted by this RN and Dr. Alvia Grove that the patient was extremely lethargic with pinpoint pupils. Consulted with attending physician (Dr. Fritzi Mandes) and this RN received a verbal order for 0.4mg  of naloxone. Naloxone administered and patient responded appropriately to medication administration. 1:1 sitter is at the bedside. Will continue to monitor.

## 2019-07-13 NOTE — Progress Notes (Signed)
Pharmacy Electrolyte Monitoring Consult:  Pharmacy consulted to assist in monitoring and replacing electrolytes in this 43 y.o. 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 Patient has history significant for seizures with recurrent breakthrough seizures.   Labs:  Sodium (mmol/L)  Date Value  07/13/2019 139  07/16/2014 145   Potassium (mmol/L)  Date Value  07/13/2019 4.0  07/16/2014 3.6   Magnesium (mg/dL)  Date Value  07/13/2019 2.1  11/22/2013 1.5 (L)   Phosphorus (mg/dL)  Date Value  07/12/2019 4.5  02/03/2013 1.7 (L)   Calcium (mg/dL)  Date Value  07/13/2019 9.0   Calcium, Total (mg/dL)  Date Value  07/16/2014 8.9   Albumin (g/dL)  Date Value  07/12/2019 3.1 (L)  07/16/2014 3.4    Assessment/Plan: Patient receiving NS/67mEq Potassium at 128mL/hr.   Patent received potassium 41mEq PO x 2 doses and magnesium 1g IV x 1 on 9/12. No replacement warranted.   Will obtain electrolytes with am labs.   Will replace to maintain electrolytes within normal limits. Will replace for goal magnesium ~ 2 in setting of seizure disorder.   Pharmacy will continue to monitor and adjust per consult.  Camdyn Beske L 07/13/2019 7:51 AM

## 2019-07-14 ENCOUNTER — Telehealth: Payer: Self-pay | Admitting: *Deleted

## 2019-07-14 ENCOUNTER — Other Ambulatory Visit: Payer: Self-pay | Admitting: *Deleted

## 2019-07-14 DIAGNOSIS — D696 Thrombocytopenia, unspecified: Secondary | ICD-10-CM

## 2019-07-14 LAB — BASIC METABOLIC PANEL
Anion gap: 8 (ref 5–15)
BUN: 12 mg/dL (ref 6–20)
CO2: 24 mmol/L (ref 22–32)
Calcium: 9.4 mg/dL (ref 8.9–10.3)
Chloride: 106 mmol/L (ref 98–111)
Creatinine, Ser: 0.64 mg/dL (ref 0.44–1.00)
GFR calc Af Amer: >60 mL/min (ref >60)
GFR calc non Af Amer: >60 mL/min (ref >60)
Glucose, Bld: 100 mg/dL — ABNORMAL HIGH (ref 70–99)
Potassium: 3.7 mmol/L (ref 3.5–5.1)
Sodium: 138 mmol/L (ref 135–145)

## 2019-07-14 LAB — CBC WITH DIFFERENTIAL/PLATELET
Abs Immature Granulocytes: 0.67 10*3/uL — ABNORMAL HIGH (ref 0.00–0.07)
Basophils Absolute: 0 10*3/uL (ref 0.0–0.1)
Basophils Relative: 0 %
Eosinophils Absolute: 0.1 10*3/uL (ref 0.0–0.5)
Eosinophils Relative: 0 %
HCT: 25.1 % — ABNORMAL LOW (ref 36.0–46.0)
Hemoglobin: 8.3 g/dL — ABNORMAL LOW (ref 12.0–15.0)
Immature Granulocytes: 3 %
Lymphocytes Relative: 10 %
Lymphs Abs: 2.1 10*3/uL (ref 0.7–4.0)
MCH: 31.7 pg (ref 26.0–34.0)
MCHC: 33.1 g/dL (ref 30.0–36.0)
MCV: 95.8 fL (ref 80.0–100.0)
Monocytes Absolute: 1.7 10*3/uL — ABNORMAL HIGH (ref 0.1–1.0)
Monocytes Relative: 8 %
Neutro Abs: 16.1 10*3/uL — ABNORMAL HIGH (ref 1.7–7.7)
Neutrophils Relative %: 79 %
Platelets: 260 10*3/uL (ref 150–400)
RBC: 2.62 MIL/uL — ABNORMAL LOW (ref 3.87–5.11)
RDW: 17.2 % — ABNORMAL HIGH (ref 11.5–15.5)
Smear Review: ADEQUATE
WBC: 20.4 10*3/uL — ABNORMAL HIGH (ref 4.0–10.5)
nRBC: 0.4 % — ABNORMAL HIGH (ref 0.0–0.2)

## 2019-07-14 LAB — MAGNESIUM: Magnesium: 2.1 mg/dL (ref 1.7–2.4)

## 2019-07-14 LAB — DAT, POLYSPECIFIC AHG (ARMC ONLY): Polyspecific AHG test: NEGATIVE

## 2019-07-14 MED ORDER — QUETIAPINE FUMARATE 200 MG PO TABS
200.0000 mg | ORAL_TABLET | Freq: Two times a day (BID) | ORAL | Status: DC
  Administered 2019-07-14: 14:00:00 200 mg via ORAL
  Filled 2019-07-14 (×2): qty 1

## 2019-07-14 MED ORDER — OXYCODONE-ACETAMINOPHEN 5-325 MG PO TABS: 1.0000 | ORAL_TABLET | Freq: Three times a day (TID) | ORAL | 0 refills | Status: AC | PRN

## 2019-07-14 MED ORDER — FOLIC ACID 1 MG PO TABS: 1.0000 mg | ORAL_TABLET | Freq: Every day | ORAL | 0 refills | Status: DC

## 2019-07-14 MED ORDER — METOPROLOL TARTRATE 25 MG PO TABS
12.5000 mg | ORAL_TABLET | Freq: Two times a day (BID) | ORAL | Status: DC
  Administered 2019-07-14: 12.5 mg via ORAL
  Filled 2019-07-14: qty 1

## 2019-07-14 MED ORDER — ADULT MULTIVITAMIN W/MINERALS CH: 1.0000 | ORAL_TABLET | Freq: Every day | ORAL | 0 refills | Status: AC

## 2019-07-14 MED ORDER — PANTOPRAZOLE SODIUM 20 MG PO TBEC: 20.0000 mg | DELAYED_RELEASE_TABLET | Freq: Every day | ORAL | 0 refills | Status: AC

## 2019-07-14 MED ORDER — METOPROLOL TARTRATE 25 MG PO TABS: 12.5000 mg | ORAL_TABLET | Freq: Two times a day (BID) | ORAL | 0 refills | Status: AC

## 2019-07-14 MED ORDER — LEVETIRACETAM 500 MG PO TABS: 500.0000 mg | ORAL_TABLET | Freq: Two times a day (BID) | ORAL | 1 refills | Status: AC

## 2019-07-14 MED ORDER — QUETIAPINE FUMARATE 200 MG PO TABS: 200.0000 mg | ORAL_TABLET | Freq: Two times a day (BID) | ORAL | 0 refills | Status: AC

## 2019-07-14 NOTE — Telephone Encounter (Signed)
Called patient's phone in room 124 and spoke to the patient Sarah Serrano.  Verified I did get the patient correct.  Told her that Dr. Janese Banks wanted to see her on Friday and told her it would be at the cancer center but she was not coming for cancer reason why she was coming because her platelets were low.  She said she got her early morning appointment but she would prefer an afternoon so I changed the appointment to 130 on Friday for lab work and 2:00 to see the doctor.  I told her that I needed to speak to her boyfriend Legrand Como to let him know he was sitting right beside over and he said he heard it and he is agreeable to that to.  CBC put in the computer for labs on that day

## 2019-07-14 NOTE — BH Assessment (Signed)
IVC PAPERS  RESCINDED  PER  JAMISON LORD  NP  DONE  BY  Taline Nass  [place]  EMERGENCY  DEPT

## 2019-07-14 NOTE — Discharge Instructions (Addendum)
[place] (Mental Health & Substance Use Services) & [facility]  Mental health service in [place], [place] Address: [address] [place], [place] [address] Hours:  Open ? Closes 5PM Phone: [phone]  Patient advised to get primary care physician in the area. If she has a previous primary care physician she needs to follow-up with him in a week. She needs to abstain from alcohol and narcotic abuse

## 2019-07-14 NOTE — Progress Notes (Signed)
Pt for discharge home. Alert. No distress.  Instructions to pt. meds / diet / activitvy  And f/u discussed.sl d/cd out via w/c

## 2019-07-14 NOTE — Progress Notes (Signed)
Pharmacy Electrolyte Monitoring Consult:  Pharmacy consulted to assist in monitoring and replacing electrolytes in this 43 y.o. 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 Patient has history significant for seizures with recurrent breakthrough seizures.   Labs:  Sodium (mmol/L)  Date Value  07/14/2019 138  07/16/2014 145   Potassium (mmol/L)  Date Value  07/14/2019 3.7  07/16/2014 3.6   Magnesium (mg/dL)  Date Value  07/14/2019 2.1  11/22/2013 1.5 (L)   Phosphorus (mg/dL)  Date Value  07/12/2019 4.5  02/03/2013 1.7 (L)   Calcium (mg/dL)  Date Value  07/14/2019 9.4   Calcium, Total (mg/dL)  Date Value  07/16/2014 8.9   Albumin (g/dL)  Date Value  07/12/2019 3.1 (L)  07/16/2014 3.4    Assessment/Plan: Patent received potassium 61mEq PO x 2 doses and magnesium 1g IV x 1 on 9/12.   No replacement warranted x 2 days.   Pharmacy will sign off at this time.  Lu Duffel, PharmD, BCPS Clinical Pharmacist 07/14/2019 7:17 AM

## 2019-07-14 NOTE — Clinical Social Work Note (Addendum)
Per MD, patient will discharge today and the psychiatry team is working on rescinding her IVC. MD asked CSW provide her with information on [facility] since she will be following up with them after discharge. CSW provided [facility] brochure with information on their services and their address and phone number. Patient said her boyfriend wants to pick her up an hour early (4:30). CSW sent secure chat to RN to notify.  CSW signing off.  Dayton Scrape, [place]

## 2019-07-14 NOTE — Discharge Summary (Signed)
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 Alcohol abuse   . Bipolar 1 disorder ([place])   . 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 No s/o hemolysis -per Dr. Janese Banks no indication for anymore steroid -+ schistocytes noted on PS couple days ago -ADAMTS-13 41% -pt has received FFP 07/11/2019 -appreciate oncology recommendations--Dr Corcoran/dr rao -LDH 325  -Discontinued clonidine and Zoloft, as recommended by pharmacy which could cause thrombocytopenia -Platelet count is at 15,000--52K--132K--325K -Hemoglobin11.5 -MRI of the brain with and without contrast is negative -Dr. Elroy Channel office will set up appointment for follow-up  # episode of AMS ?narcotic /muscle relaxants/ativan--received narcan x2 -now awake and alert -I have Discontinued most of the sedating meds. -Patient is alert and oriented today  #bipolar disorder with substance abuse CT head is negative MRI of the brain is also negative Patient is medically incompetent per psychiatry. -seroquel 200 gm bid -per psychiatry they will rescind IVC documents and recommend [facility] follow-up  #Acute pancreatitis alcohol induced -Clinically improving--lipase down  -Pain  medications as needed -- I have prescribed only 10 pills of Percocet. No refills -CT abdomen has revealed acute pancreatitis with no abscess -Repeat CT abdomen on 07/09/2019 revealed improving acute pancreatitis compared to prior study of CT scan. Enhancing lesion in the left hepatic lobe favoring benign vascular lesion and uterine leiomyoma increased in size from May 2015 -GI Dr. Elmer Ramp seen the patient and will remotely follow as she is clinically improving -lipase at 140-89-85--178--131 -tolerating regular diet  #Symptomatic anemia no active bleeding -GI is following--now signed off -Stool for occult blood-negative -Hemoglobin 12.6-10.3-8.5-7.1 given 1 unit of blood transfusion hemoglobin at 8.4  #Recurrent breakthrough seizures, from alcohol withdrawal -1 g IV Keppra given and neurology is recommending to continuepo  Keppra 500 every 12 hours -Dr. Doy Mince is following -EEG ordered but patient has refused EEG and further work-up   #Essential hypertension - metoprolol dose increased to 12.5 mg p.o. twice daily -patient's clonidine has been discontinued  #Hepatic lobe lesion seems to be benign PCP to follow-up and refer to hepatologist if needed  #Uterine leiomyoma outpatient follow-up with GYN  Patient will discharged to home with fianc/boyfriend Michael. I spoke with him regarding the discharge plan. He is agreeable with it.  Discussed with psychiatry there in agreement for patient discharging home. IVC papers will be rescinded prior to discharge.  CONSULTS OBTAINED:  Treatment Team:  Sindy Guadeloupe, MD Alexis Goodell, MD Patrecia Pour, NP  DRUG ALLERGIES:  No Known Allergies  DISCHARGE MEDICATIONS:   Allergies as of 07/14/2019   No Known Allergies     Medication List    STOP taking these medications   Buprenorphine HCl-Naloxone HCl 8-2 MG Film   cloNIDine 0.1 MG tablet Commonly known as: CATAPRES   cyclobenzaprine 10 MG tablet Commonly  known as:  FLEXERIL   naproxen 500 MG tablet Commonly known as: Naprosyn   sertraline 100 MG tablet Commonly known as: ZOLOFT     TAKE these medications   acetaminophen 325 MG tablet Commonly known as: TYLENOL Take 650 mg by mouth every 6 (six) hours as needed for mild pain or fever.   folic acid 1 MG tablet Commonly known as: FOLVITE Take 1 tablet (1 mg total) by mouth daily.   gabapentin 300 MG capsule Commonly known as: NEURONTIN Take 300 mg by mouth 2 (two) times daily as needed (back pain).   hydrOXYzine 25 MG tablet Commonly known as: ATARAX/VISTARIL Take 25 mg by mouth 3 (three) times daily as needed for anxiety.   levETIRAcetam 500 MG tablet Commonly known as: KEPPRA Take 1 tablet (500 mg total) by mouth 2 (two) times daily.   metoprolol tartrate 25 MG tablet Commonly known as: LOPRESSOR Take 0.5 tablets (12.5 mg total) by mouth 2 (two) times daily.   multivitamin with minerals Tabs tablet Take 1 tablet by mouth daily.   Narcan 4 MG/0.1ML Liqd nasal spray kit Generic drug: naloxone Place 1 spray into the nose as directed.   oxyCODONE-acetaminophen 5-325 MG tablet Commonly known as: PERCOCET/ROXICET Take 1 tablet by mouth every 8 (eight) hours as needed for severe pain.   pantoprazole 20 MG tablet Commonly known as: PROTONIX Take 1 tablet (20 mg total) by mouth daily.   polyethylene glycol 17 g packet Commonly known as: MIRALAX / GLYCOLAX Take 17 g by mouth daily.   QUEtiapine 200 MG tablet Commonly known as: SEROQUEL Take 1 tablet (200 mg total) by mouth 2 (two) times daily. What changed:   medication strength  how much to take       If you experience worsening of your admission symptoms, develop shortness of breath, life threatening emergency, suicidal or homicidal thoughts you must seek medical attention immediately by calling 911 or calling your MD immediately  if symptoms less severe.  You Must read complete instructions/literature along with all  the possible adverse reactions/side effects for all the Medicines you take and that have been prescribed to you. Take any new Medicines after you have completely understood and accept all the possible adverse reactions/side effects.   Please note  You were cared for by a hospitalist during your hospital stay. If you have any questions about your discharge medications or the care you received while you were in the hospital after you are discharged, you can call the unit and asked to speak with the hospitalist on call if the hospitalist that took care of you is not available. Once you are discharged, your primary care physician will handle any further medical issues. Please note that NO REFILLS for any discharge medications will be authorized once you are discharged, as it is imperative that you return to your primary care physician (or establish a relationship with a primary care physician if you do not have one) for your aftercare needs so that they can reassess your need for medications and monitor your lab values. Today   SUBJECTIVE   Doing well. Sitter in the room.  VITAL SIGNS:  Blood pressure 102/66, pulse 95, temperature 98.8 F (37.1 C), temperature source Oral, resp. rate 18, height 5' 4"  (1.626 m), weight 77.9 kg, last menstrual period 07/05/2019, SpO2 94 %.  I/O:    Intake/Output Summary (Last 24 hours) at 07/14/2019 1115 Last data filed at 07/14/2019 0659 Gross per 24 hour  Intake 960 ml  Output -  Net 960 ml    PHYSICAL EXAMINATION:  GENERAL:  43 y.o.-year-old patient lying in the bed with no acute distress.  EYES: Pupils equal, round, reactive to light and accommodation. No scleral icterus. Extraocular muscles intact.  HEENT: Head atraumatic, normocephalic. Oropharynx and nasopharynx clear.  NECK:  Supple, no jugular venous distention. No thyroid enlargement, no tenderness.  LUNGS: Normal breath sounds bilaterally, no wheezing, rales,rhonchi or crepitation. No use of  accessory muscles of respiration.  CARDIOVASCULAR: S1, S2 normal. No murmurs, rubs, or gallops.  ABDOMEN: Soft, non-tender, non-distended. Bowel sounds present. No organomegaly or mass.  EXTREMITIES: No pedal edema, cyanosis, or clubbing.  NEUROLOGIC: Cranial nerves II through XII are intact. Muscle strength 5/5 in all extremities. Sensation intact. Gait not checked.  PSYCHIATRIC: The patient is alert and oriented x 3.  SKIN: No obvious rash, lesion, or ulcer.   DATA REVIEW:   CBC  Recent Labs  Lab 07/14/19 0422  WBC 20.4*  HGB 8.3*  HCT 25.1*  PLT 260    Chemistries  Recent Labs  Lab 07/12/19 0815  07/14/19 0422  NA 141   < > 138  K 3.0*   < > 3.7  CL 108   < > 106  CO2 22   < > 24  GLUCOSE 94   < > 100*  BUN 6   < > 12  CREATININE 0.58   < > 0.64  CALCIUM 9.1   < > 9.4  MG 1.9   < > 2.1  AST 65*  --   --   ALT 57*  --   --   ALKPHOS 49  --   --   BILITOT 0.7  --   --    < > = values in this interval not displayed.    Microbiology Results   Recent Results (from the past 240 hour(s))  SARS CORONAVIRUS 2 (TAT 6-24 HRS) Nasopharyngeal Nasopharyngeal Swab     Status: None   Collection Time: 07/07/19  1:34 AM   Specimen: Nasopharyngeal Swab  Result Value Ref Range Status   SARS Coronavirus 2 NEGATIVE NEGATIVE Final    Comment: (NOTE) SARS-CoV-2 target nucleic acids are NOT DETECTED. The SARS-CoV-2 RNA is generally detectable in upper and lower respiratory specimens during the acute phase of infection. Negative results do not preclude SARS-CoV-2 infection, do not rule out co-infections with other pathogens, and should not be used as the sole basis for treatment or other patient management decisions. Negative results must be combined with clinical observations, patient history, and epidemiological information. The expected result is Negative. Fact Sheet for Patients: SugarRoll.be Fact Sheet for Healthcare  Providers: https://www.woods-mathews.com/ This test is not yet approved or cleared by the [place] FDA and  has been authorized for detection and/or diagnosis of SARS-CoV-2 by FDA under an Emergency Use Authorization (EUA). This EUA will remain  in effect (meaning this test can be used) for the duration of the COVID-19 declaration under Section 56 4(b)(1) of the Act, 21 U.S.C. section 360bbb-3(b)(1), unless the authorization is terminated or revoked sooner. Performed at [facility], [place] [address], [place], [place] [address]   SARS Coronavirus 2 [facility] order, Performed in [facility] hospital lab) Nasopharyngeal     Status: None   Collection Time: 07/07/19 10:16 AM   Specimen: Nasopharyngeal  Result Value Ref Range Status   SARS Coronavirus 2 NEGATIVE NEGATIVE Final    Comment: (NOTE) If result is NEGATIVE SARS-CoV-2 target nucleic acids are  NOT DETECTED. The SARS-CoV-2 RNA is generally detectable in upper and lower  respiratory specimens during the acute phase of infection. The lowest  concentration of SARS-CoV-2 viral copies this assay can detect is 250  copies / mL. A negative result does not preclude SARS-CoV-2 infection  and should not be used as the sole basis for treatment or other  patient management decisions.  A negative result may occur with  improper specimen collection / handling, submission of specimen other  than nasopharyngeal swab, presence of viral mutation(s) within the  areas targeted by this assay, and inadequate number of viral copies  (<250 copies / mL). A negative result must be combined with clinical  observations, patient history, and epidemiological information. If result is POSITIVE SARS-CoV-2 target nucleic acids are DETECTED. The SARS-CoV-2 RNA is generally detectable in upper and lower  respiratory specimens dur ing the acute phase of infection.  Positive  results are indicative of active infection with SARS-CoV-2.   Clinical  correlation with patient history and other diagnostic information is  necessary to determine patient infection status.  Positive results do  not rule out bacterial infection or co-infection with other viruses. If result is PRESUMPTIVE POSTIVE SARS-CoV-2 nucleic acids MAY BE PRESENT.   A presumptive positive result was obtained on the submitted specimen  and confirmed on repeat testing.  While 2019 novel coronavirus  (SARS-CoV-2) nucleic acids may be present in the submitted sample  additional confirmatory testing may be necessary for epidemiological  and / or clinical management purposes  to differentiate between  SARS-CoV-2 and other Sarbecovirus currently known to infect humans.  If clinically indicated additional testing with an alternate test  methodology [id number]) is advised. The SARS-CoV-2 RNA is generally  detectable in upper and lower respiratory sp ecimens during the acute  phase of infection. The expected result is Negative. Fact Sheet for Patients:  StrictlyIdeas.no Fact Sheet for Healthcare Providers: BankingDealers.co.za This test is not yet approved or cleared by the [place] FDA and has been authorized for detection and/or diagnosis of SARS-CoV-2 by FDA under an Emergency Use Authorization (EUA).  This EUA will remain in effect (meaning this test can be used) for the duration of the COVID-19 declaration under Section 564(b)(1) of the Act, 21 U.S.C. section 360bbb-3(b)(1), unless the authorization is terminated or revoked sooner. Performed at [facility], [address] [place], [place] [address]   MRSA PCR Screening     Status: None   Collection Time: 07/07/19 10:16 AM   Specimen: Nasopharyngeal  Result Value Ref Range Status   MRSA by PCR NEGATIVE NEGATIVE Final    Comment:        The GeneXpert MRSA Assay (FDA approved for NASAL specimens only), is one component of a comprehensive MRSA  colonization surveillance program. It is not intended to diagnose MRSA infection nor to guide or monitor treatment for MRSA infections. Performed at [facility], [address], [place], [place] [address]     RADIOLOGY:  No results found.   CODE STATUS:     Code Status Orders  (From admission, onward)         Start     Ordered   07/07/19 0411  Full code  Continuous     07/07/19 0410        Code Status History    This patient has a current code status but no historical code status.   Advance Care Planning Activity      TOTAL TIME TAKING CARE OF  THIS PATIENT: **40* minutes.    Fritzi Mandes M.D on 07/14/2019 at 11:15 AM  Between 7am to 6pm - Pager - [phone] After 6pm go to [email or web address] - password [email or web address] [place] [facility]  Office  [phone]  CC: Primary care physician; Patient, No Pcp Per

## 2019-07-15 LAB — HEPATITIS C ANTIBODY: HCV Ab: 0.2 s/co ratio (ref 0.0–0.9)

## 2019-07-15 LAB — HEPATITIS B CORE ANTIBODY, TOTAL: Hep B Core Total Ab: NEGATIVE

## 2019-07-15 LAB — HEPATITIS B SURFACE ANTIGEN: Hepatitis B Surface Ag: NEGATIVE

## 2019-07-15 LAB — HAPTOGLOBIN: Haptoglobin: 118 mg/dL (ref 42–296)

## 2019-07-16 ENCOUNTER — Encounter (HOSPITAL_COMMUNITY): Payer: Self-pay | Admitting: Oncology

## 2019-07-18 ENCOUNTER — Inpatient Hospital Stay: Payer: Medicaid Other | Admitting: Oncology

## 2019-07-18 ENCOUNTER — Inpatient Hospital Stay: Payer: Medicaid Other | Attending: Oncology

## 2019-07-18 ENCOUNTER — Telehealth: Payer: Self-pay | Admitting: Oncology

## 2019-07-18 NOTE — Telephone Encounter (Signed)
LM to try and R/S NS appt

## 2019-08-21 ENCOUNTER — Telehealth: Payer: Self-pay | Admitting: Oncology

## 2019-08-21 NOTE — Telephone Encounter (Signed)
PT VM full; was unable to leave message to try and R/S NS appt-ltg

## 2019-08-22 ENCOUNTER — Other Ambulatory Visit: Payer: Self-pay

## 2019-08-22 ENCOUNTER — Emergency Department
Admission: EM | Admit: 2019-08-22 | Discharge: 2019-08-22 | Disposition: A | Discharge status: Home / Self Care | Payer: Medicaid Other | Attending: Emergency Medicine | Admitting: Emergency Medicine

## 2019-08-22 ENCOUNTER — Encounter: Payer: Self-pay | Admitting: Emergency Medicine

## 2019-08-22 ENCOUNTER — Emergency Department: Payer: Medicaid Other

## 2019-08-22 DIAGNOSIS — F17210 Nicotine dependence, cigarettes, uncomplicated: Secondary | ICD-10-CM | POA: Diagnosis not present

## 2019-08-22 DIAGNOSIS — Z79899 Other long term (current) drug therapy: Secondary | ICD-10-CM | POA: Insufficient documentation

## 2019-08-22 DIAGNOSIS — R1084 Generalized abdominal pain: Secondary | ICD-10-CM | POA: Insufficient documentation

## 2019-08-22 DIAGNOSIS — I10 Essential (primary) hypertension: Secondary | ICD-10-CM

## 2019-08-22 DIAGNOSIS — R109 Unspecified abdominal pain: Secondary | ICD-10-CM | POA: Diagnosis present

## 2019-08-22 DIAGNOSIS — K2920 Alcoholic gastritis without bleeding: Secondary | ICD-10-CM

## 2019-08-22 DIAGNOSIS — K529 Noninfective gastroenteritis and colitis, unspecified: Secondary | ICD-10-CM

## 2019-08-22 HISTORY — DX: Acute pancreatitis without necrosis or infection, unspecified: K85.90

## 2019-08-22 HISTORY — DX: Thrombocytopenia, unspecified: D69.6

## 2019-08-22 LAB — COMPREHENSIVE METABOLIC PANEL
ALT: 31 U/L (ref 0–44)
AST: 40 U/L (ref 15–41)
Albumin: 3.9 g/dL (ref 3.5–5.0)
Alkaline Phosphatase: 62 U/L (ref 38–126)
Anion gap: 19 — ABNORMAL HIGH (ref 5–15)
BUN: 7 mg/dL (ref 6–20)
CO2: 21 mmol/L — ABNORMAL LOW (ref 22–32)
Calcium: 8.4 mg/dL — ABNORMAL LOW (ref 8.9–10.3)
Chloride: 105 mmol/L (ref 98–111)
Creatinine, Ser: 0.51 mg/dL (ref 0.44–1.00)
GFR calc Af Amer: >60 mL/min (ref >60)
GFR calc non Af Amer: >60 mL/min (ref >60)
Glucose, Bld: 113 mg/dL — ABNORMAL HIGH (ref 70–99)
Potassium: 3.2 mmol/L — ABNORMAL LOW (ref 3.5–5.1)
Sodium: 145 mmol/L (ref 135–145)
Total Bilirubin: 1.1 mg/dL (ref 0.3–1.2)
Total Protein: 8.7 g/dL — ABNORMAL HIGH (ref 6.5–8.1)

## 2019-08-22 LAB — CBC WITH DIFFERENTIAL/PLATELET
Abs Immature Granulocytes: 0.03 10*3/uL (ref 0.00–0.07)
Basophils Absolute: 0.1 10*3/uL (ref 0.0–0.1)
Basophils Relative: 1 %
Eosinophils Absolute: 0 10*3/uL (ref 0.0–0.5)
Eosinophils Relative: 0 %
HCT: 39.4 % (ref 36.0–46.0)
Hemoglobin: 13.1 g/dL (ref 12.0–15.0)
Immature Granulocytes: 0 %
Lymphocytes Relative: 36 %
Lymphs Abs: 3.2 10*3/uL (ref 0.7–4.0)
MCH: 29.9 pg (ref 26.0–34.0)
MCHC: 33.2 g/dL (ref 30.0–36.0)
MCV: 90 fL (ref 80.0–100.0)
Monocytes Absolute: 0.3 10*3/uL (ref 0.1–1.0)
Monocytes Relative: 4 %
Neutro Abs: 5.3 10*3/uL (ref 1.7–7.7)
Neutrophils Relative %: 59 %
Platelets: 337 10*3/uL (ref 150–400)
RBC: 4.38 MIL/uL (ref 3.87–5.11)
RDW: 14.9 % (ref 11.5–15.5)
WBC: 8.9 10*3/uL (ref 4.0–10.5)
nRBC: 0 % (ref 0.0–0.2)

## 2019-08-22 LAB — PROTIME-INR
INR: 1.1 (ref 0.8–1.2)
Prothrombin Time: 13.9 seconds (ref 11.4–15.2)

## 2019-08-22 LAB — TYPE AND SCREEN
ABO/RH(D): O POS
Antibody Screen: NEGATIVE

## 2019-08-22 LAB — ETHANOL: Alcohol, Ethyl (B): 314 mg/dL (ref <10)

## 2019-08-22 LAB — LIPASE, BLOOD: Lipase: 13 U/L (ref 11–51)

## 2019-08-22 MED ORDER — IOHEXOL 9 MG/ML PO SOLN
500.0000 mL | Freq: Once | ORAL | Status: AC | PRN
  Administered 2019-08-22 (×2): 500 mL via ORAL
  Filled 2019-08-22 (×2): qty 500

## 2019-08-22 MED ORDER — SODIUM CHLORIDE 0.9 % IV BOLUS
1000.0000 mL | Freq: Once | INTRAVENOUS | Status: AC
  Administered 2019-08-22: 1000 mL via INTRAVENOUS

## 2019-08-22 MED ORDER — IOHEXOL 300 MG/ML  SOLN
100.0000 mL | Freq: Once | INTRAMUSCULAR | Status: AC | PRN
  Administered 2019-08-22: 100 mL via INTRAVENOUS

## 2019-08-22 MED ORDER — LABETALOL HCL 5 MG/ML IV SOLN
10.0000 mg | Freq: Once | INTRAVENOUS | Status: AC
  Administered 2019-08-22: 10 mg via INTRAVENOUS
  Filled 2019-08-22: qty 4

## 2019-08-22 MED ORDER — AMOXICILLIN-POT CLAVULANATE 875-125 MG PO TABS: 1.0000 | ORAL_TABLET | Freq: Two times a day (BID) | ORAL | 0 refills | Status: AC

## 2019-08-22 MED ORDER — DICYCLOMINE HCL 20 MG PO TABS: 20.0000 mg | ORAL_TABLET | Freq: Three times a day (TID) | ORAL | 0 refills | Status: AC | PRN

## 2019-08-22 MED ORDER — DROPERIDOL 2.5 MG/ML IJ SOLN
2.5000 mg | Freq: Once | INTRAMUSCULAR | Status: AC
  Administered 2019-08-22: 2.5 mg via INTRAVENOUS
  Filled 2019-08-22: qty 2

## 2019-08-22 MED ORDER — ONDANSETRON 4 MG PO TBDP: 4.0000 mg | ORAL_TABLET | Freq: Three times a day (TID) | ORAL | 0 refills | Status: AC | PRN

## 2019-08-22 NOTE — ED Notes (Signed)
This RN introduced self to pt. Pt finished omnipaque. CT was made aware. Pt given warm blanket. Pt denies any further needs. Will continue to monitor.

## 2019-08-22 NOTE — ED Provider Notes (Signed)
Assumed care from Dr. Beather Arbour at 7 AM. Briefly, the patient is a 43 y.o. female with PMHx of  has a past medical history of Alcohol abuse, Bipolar 1 disorder ([place]), Pancreatitis, Substance abuse ([place]), and Thrombocytopenia ([place]). here with abdominal pain, nausea, vomiting. Heavily intoxicated on arrival. Labs overall reassuring. IVF, Droperidol given. CT scan pending.   Labs Reviewed  COMPREHENSIVE METABOLIC PANEL - Abnormal; Notable for the following components:      Result Value   Potassium 3.2 (*)    CO2 21 (*)    Glucose, Bld 113 (*)    Calcium 8.4 (*)    Total Protein 8.7 (*)    Anion gap 19 (*)    All other components within normal limits  ETHANOL - Abnormal; Notable for the following components:   Alcohol, Ethyl (B) 314 (*)    All other components within normal limits  CBC WITH DIFFERENTIAL/PLATELET  LIPASE, BLOOD  PROTIME-INR  URINALYSIS, COMPLETE (UACMP) WITH MICROSCOPIC  URINE DRUG SCREEN, QUALITATIVE ([facility] ONLY)  POC URINE PREG, ED  TYPE AND SCREEN    Course of Care: CT scan reviewed, shows possible mild colitis.  No evidence of perforation or obstruction.  Patient feels markedly improved in the ED.  Her blood pressure is improving.  Of note, she has been hypertensive in the past and prescribed medications, but does not have a regular doctor.  For now, given that her hypertension could be related to her pain as well as alcohol use, will treat her colitis, refer her for PCP for follow-up.  Given her history of polysubstance abuse, hesitant to refill her beta-blocker in the setting of possible cocaine use as well.  No chest pain.  She feels much better and is tolerating p.o.     Duffy Bruce, MD 08/22/19 [phone]

## 2019-08-22 NOTE — ED Notes (Signed)
Pt resting comfortably in bed, drinking contrast.

## 2019-08-22 NOTE — ED Provider Notes (Addendum)
[facility] Emergency Department Provider Note   ____________________________________________   First MD Initiated Contact with Patient 08/22/19 [phone]     (approximate)  I have reviewed the triage vital signs and the nursing notes.   HISTORY  Chief Complaint Abdominal Pain    HPI Sarah Serrano is a 43 y.o. female who presents to the ED from home with a chief complaint of abdominal pain, nausea and vomiting "all day".  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  Medically nonadherent to her appointments with heme/onc.  Also history of alcohol and cocaine abuse.  Admits to EtOH and marijuana only within the last 24 hours.  Denies fever, cough, chest pain, shortness of breath, dysuria, diarrhea.  Denies bleeding issues or bruising.       Past Medical History:  Diagnosis Date  . Alcohol abuse   . Bipolar 1 disorder ([place])   . Pancreatitis   . Substance abuse ([place])   . Thrombocytopenia [facility])     Patient Active Problem List   Diagnosis Date Noted  . Encounter for competency evaluation 07/09/2019  . Pancytopenia ([place])   . Bipolar disorder ([place]) 07/08/2019  . Anemia   . Severe thrombocytopenia ([place])   . Pancreatitis 07/06/2019    Past Surgical History:  Procedure Laterality Date  . NO PAST SURGERIES      Prior to Admission medications   Medication Sig Start Date End Date Taking? Authorizing Provider  acetaminophen (TYLENOL) 325 MG tablet Take 650 mg by mouth every 6 (six) hours as needed for mild pain or fever.    [provider]  folic acid (FOLVITE) 1 MG tablet Take 1 tablet (1 mg total) by mouth daily. 07/14/19   Fritzi Mandes, MD  gabapentin (NEURONTIN) 300 MG capsule Take 300 mg by mouth 2 (two) times daily as needed (back pain).    [provider]  hydrOXYzine (ATARAX/VISTARIL) 25 MG tablet Take 25 mg by mouth 3 (three) times daily as needed for anxiety.    [provider]  levETIRAcetam  (KEPPRA) 500 MG tablet Take 1 tablet (500 mg total) by mouth 2 (two) times daily. 07/14/19   Fritzi Mandes, MD  metoprolol tartrate (LOPRESSOR) 25 MG tablet Take 0.5 tablets (12.5 mg total) by mouth 2 (two) times daily. 07/14/19   Fritzi Mandes, MD  Multiple Vitamin (MULTIVITAMIN WITH MINERALS) TABS tablet Take 1 tablet by mouth daily. 07/14/19   Fritzi Mandes, MD  naloxone [facility]) 4 MG/0.1ML LIQD nasal spray kit Place 1 spray into the nose as directed.    [provider]  oxyCODONE-acetaminophen (PERCOCET/ROXICET) 5-325 MG tablet Take 1 tablet by mouth every 8 (eight) hours as needed for severe pain. 07/14/19   Fritzi Mandes, MD  pantoprazole (PROTONIX) 20 MG tablet Take 1 tablet (20 mg total) by mouth daily. 07/14/19   Fritzi Mandes, MD  polyethylene glycol (MIRALAX / GLYCOLAX) 17 g packet Take 17 g by mouth daily.    [provider]  QUEtiapine (SEROQUEL) 200 MG tablet Take 1 tablet (200 mg total) by mouth 2 (two) times daily. 07/14/19   Fritzi Mandes, MD    Allergies Patient has no known allergies.  Family History  Problem Relation Age of Onset  . Bipolar disorder Mother     Social History Social History   Tobacco Use  . Smoking status: Current Every Day Smoker    Packs/day: 0.50    Types: Cigarettes  . Smokeless tobacco: Never Used  Substance Use Topics  .  Alcohol use: Yes  . Drug use: Yes    Types: Marijuana    Review of Systems  Constitutional: No fever/chills Eyes: No visual changes. ENT: No sore throat. Cardiovascular: Denies chest pain. Respiratory: Denies shortness of breath. Gastrointestinal: Positive for abdominal pain, nausea and vomiting.  No diarrhea.  No constipation. Genitourinary: Negative for dysuria. Musculoskeletal: Negative for back pain. Skin: Negative for rash. Neurological: Negative for headaches, focal weakness or numbness.   ____________________________________________   PHYSICAL EXAM:  VITAL SIGNS: ED Triage Vitals [08/22/19 0451]   Enc Vitals Group     BP (!) 178/103     Pulse Rate (!) 118     Resp 18     Temp 98.4 F (36.9 C)     Temp Source Oral     SpO2 96 %     Weight      Height      Head Circumference      Peak Flow      Pain Score      Pain Loc      Pain Edu?      Excl. in [place]?     Constitutional: Alert and oriented. Well appearing and in mild to moderate acute distress.  Tearful, presents in dramatic fashion. Eyes: Conjunctivae are normal. PERRL. EOMI. Head: Atraumatic. Nose: No congestion/rhinnorhea. Mouth/Throat: Mucous membranes are moist.  Oropharynx non-erythematous. Neck: No stridor.   Cardiovascular: Normal rate, regular rhythm. Grossly normal heart sounds.  Good peripheral circulation. Respiratory: Normal respiratory effort.  No retractions. Lungs CTAB. Gastrointestinal: Soft with tenderness to palpation epigastrium without rebound or guarding. No distention. No abdominal bruits. No CVA tenderness. Musculoskeletal: No lower extremity tenderness nor edema.  No joint effusions. Neurologic:  Normal speech and language. No gross focal neurologic deficits are appreciated. No gait instability. Skin:  Skin is warm, dry and intact. No rash noted. Psychiatric: Mood and affect are normal. Speech and behavior are normal.  ____________________________________________   LABS (all labs ordered are listed, but only abnormal results are displayed)  Labs Reviewed  COMPREHENSIVE METABOLIC PANEL - Abnormal; Notable for the following components:      Result Value   Potassium 3.2 (*)    CO2 21 (*)    Glucose, Bld 113 (*)    Calcium 8.4 (*)    Total Protein 8.7 (*)    Anion gap 19 (*)    All other components within normal limits  ETHANOL - Abnormal; Notable for the following components:   Alcohol, Ethyl (B) 314 (*)    All other components within normal limits  CBC WITH DIFFERENTIAL/PLATELET  LIPASE, BLOOD  PROTIME-INR  URINALYSIS, COMPLETE (UACMP) WITH MICROSCOPIC  URINE DRUG SCREEN, QUALITATIVE  ([facility] ONLY)  POC URINE PREG, ED  TYPE AND SCREEN   ____________________________________________  EKG  ED ECG REPORT I, SUNG,JADE J, the attending physician, personally viewed and interpreted this ECG.   Date: 08/22/2019  EKG Time: 0459  Rate: 108  Rhythm: sinus tachycardia  Axis: Normal  Intervals:none  ST&T Change: Nonspecific QTC 457  ____________________________________________  RADIOLOGY  ED MD interpretation:  Pending  Official radiology report(s): No results found.  ____________________________________________   PROCEDURES  Procedure(s) performed (including Critical Care):  Procedures   ____________________________________________   INITIAL IMPRESSION / ASSESSMENT AND PLAN / ED COURSE  As part of my medical decision making, I reviewed the following data within the [place] notes reviewed and incorporated, Labs reviewed, Old chart reviewed, Notes from prior ED visits and [place] Controlled Substance  Database     Sarah Serrano was evaluated in Emergency Department on 08/22/2019 for the symptoms described in the history of present illness. She was evaluated in the context of the global COVID-19 pandemic, which necessitated consideration that the patient might be at risk for infection with the SARS-CoV-2 virus that causes COVID-19. Institutional protocols and algorithms that pertain to the evaluation of patients at risk for COVID-19 are in a state of rapid change based on information released by regulatory bodies including the CDC and federal and state organizations. These policies and algorithms were followed during the patient's care in the ED.    43 year old female who presents with abdominal pain, nausea and vomiting. Differential diagnosis includes, but is not limited to, biliary disease (biliary colic, acute cholecystitis, cholangitis, choledocholithiasis, etc), intrathoracic causes for epigastric abdominal pain including ACS,  gastritis, duodenitis, pancreatitis, small bowel or large bowel obstruction, abdominal aortic aneurysm, hernia, and ulcer(s).  Will obtain basic lab work, initiate IV fluid resuscitation, IV droperidol for calming as well as antinausea.  Will reassess.   Clinical Course as of Aug 21 2324  Fri Aug 22, 2019  0233 Laboratory results unremarkable; EtOH still pending.  No obvious reason for patient's abdominal pain and vomiting.  Will proceed with CT scan.   [JS]  [id number] Noted very elevated EtOH level.   [JS]  0707 Patient feeling better.  Working on second bottle of oral contrast.  Updated her of all test results thus far.  Care transferred to Dr. Ellender Hose at change of shift pending CT scan and urinalysis.   [JS]  0815 CT Abdomen Pelvis W Contrast [CI]    Clinical Course User Index [CI] Duffy Bruce, MD [JS] Paulette Blanch, MD     ____________________________________________   FINAL CLINICAL IMPRESSION(S) / ED DIAGNOSES  Final diagnoses:  Generalized abdominal pain     ED Discharge Orders    None       Note:  This document was prepared using Dragon voice recognition software and may include unintentional dictation errors.   Paulette Blanch, MD 08/22/19 4356    Paulette Blanch, MD 08/22/19 2325

## 2019-08-22 NOTE — ED Triage Notes (Signed)
Pt ambulatory to STAT desk, stating that she has been having N/V "all day"; pt then collapses forward onto counter top and is unresponsive for several seconds; arouses when stimulated; pt assisted into w/c; white substance noted on lips; pt admits to ETOH and marijuana only; noted in chart hx pt with hx of thrombocytopenia and pancreatits but appears unaware that she has ever been dx with such

## 2019-08-22 NOTE — ED Notes (Signed)
Patient transported to CT 

## 2019-08-22 NOTE — ED Notes (Addendum)
Pt blood pressure noted continuous rise. This RN adjusted blood pressure cuff and pt in bed. Repeat blood presssure was 195/121. MD made aware. No new orders at this time. Will continue to monitor.

## 2019-08-24 ENCOUNTER — Emergency Department: Payer: Medicaid Other

## 2019-08-24 ENCOUNTER — Inpatient Hospital Stay
Admission: EM | Admit: 2019-08-24 | Discharge: 2019-08-28 | DRG: 391 | Discharge status: Against Medical Advice | Payer: Medicaid Other | Attending: Internal Medicine | Admitting: Internal Medicine

## 2019-08-24 ENCOUNTER — Other Ambulatory Visit: Payer: Self-pay

## 2019-08-24 DIAGNOSIS — I160 Hypertensive urgency: Secondary | ICD-10-CM | POA: Diagnosis present

## 2019-08-24 DIAGNOSIS — F4310 Post-traumatic stress disorder, unspecified: Secondary | ICD-10-CM | POA: Diagnosis present

## 2019-08-24 DIAGNOSIS — Z888 Allergy status to other drugs, medicaments and biological substances status: Secondary | ICD-10-CM

## 2019-08-24 DIAGNOSIS — D6959 Other secondary thrombocytopenia: Secondary | ICD-10-CM | POA: Diagnosis present

## 2019-08-24 DIAGNOSIS — Z5329 Procedure and treatment not carried out because of patient's decision for other reasons: Secondary | ICD-10-CM | POA: Diagnosis not present

## 2019-08-24 DIAGNOSIS — F10229 Alcohol dependence with intoxication, unspecified: Secondary | ICD-10-CM | POA: Diagnosis present

## 2019-08-24 DIAGNOSIS — K529 Noninfective gastroenteritis and colitis, unspecified: Secondary | ICD-10-CM | POA: Diagnosis present

## 2019-08-24 DIAGNOSIS — F319 Bipolar disorder, unspecified: Secondary | ICD-10-CM | POA: Diagnosis present

## 2019-08-24 DIAGNOSIS — D500 Iron deficiency anemia secondary to blood loss (chronic): Secondary | ICD-10-CM | POA: Diagnosis present

## 2019-08-24 DIAGNOSIS — Y908 Blood alcohol level of 240 mg/100 ml or more: Secondary | ICD-10-CM | POA: Diagnosis present

## 2019-08-24 DIAGNOSIS — Z818 Family history of other mental and behavioral disorders: Secondary | ICD-10-CM | POA: Diagnosis not present

## 2019-08-24 DIAGNOSIS — G4089 Other seizures: Secondary | ICD-10-CM | POA: Diagnosis present

## 2019-08-24 DIAGNOSIS — N10 Acute tubulo-interstitial nephritis: Secondary | ICD-10-CM | POA: Diagnosis present

## 2019-08-24 DIAGNOSIS — Z79899 Other long term (current) drug therapy: Secondary | ICD-10-CM | POA: Diagnosis not present

## 2019-08-24 DIAGNOSIS — G9341 Metabolic encephalopathy: Secondary | ICD-10-CM | POA: Diagnosis not present

## 2019-08-24 DIAGNOSIS — F1010 Alcohol abuse, uncomplicated: Secondary | ICD-10-CM

## 2019-08-24 DIAGNOSIS — F10231 Alcohol dependence with withdrawal delirium: Secondary | ICD-10-CM | POA: Diagnosis not present

## 2019-08-24 DIAGNOSIS — Z20828 Contact with and (suspected) exposure to other viral communicable diseases: Secondary | ICD-10-CM | POA: Diagnosis present

## 2019-08-24 DIAGNOSIS — F203 Undifferentiated schizophrenia: Secondary | ICD-10-CM | POA: Diagnosis present

## 2019-08-24 DIAGNOSIS — F17210 Nicotine dependence, cigarettes, uncomplicated: Secondary | ICD-10-CM | POA: Diagnosis present

## 2019-08-24 DIAGNOSIS — A09 Infectious gastroenteritis and colitis, unspecified: Secondary | ICD-10-CM | POA: Diagnosis present

## 2019-08-24 DIAGNOSIS — M549 Dorsalgia, unspecified: Secondary | ICD-10-CM

## 2019-08-24 LAB — COMPREHENSIVE METABOLIC PANEL
ALT: 29 U/L (ref 0–44)
ALT: 29 U/L (ref 0–44)
AST: 45 U/L — ABNORMAL HIGH (ref 15–41)
AST: 57 U/L — ABNORMAL HIGH (ref 15–41)
Albumin: 3.7 g/dL (ref 3.5–5.0)
Albumin: 3.3 g/dL — ABNORMAL LOW (ref 3.5–5.0)
Alkaline Phosphatase: 55 U/L (ref 38–126)
Alkaline Phosphatase: 52 U/L (ref 38–126)
Anion gap: 17 — ABNORMAL HIGH (ref 5–15)
Anion gap: 16 — ABNORMAL HIGH (ref 5–15)
BUN: 6 mg/dL (ref 6–20)
BUN: 6 mg/dL (ref 6–20)
CO2: 22 mmol/L (ref 22–32)
CO2: 24 mmol/L (ref 22–32)
Calcium: 8 mg/dL — ABNORMAL LOW (ref 8.9–10.3)
Calcium: 7.8 mg/dL — ABNORMAL LOW (ref 8.9–10.3)
Chloride: 105 mmol/L (ref 98–111)
Chloride: 101 mmol/L (ref 98–111)
Creatinine, Ser: 0.38 mg/dL — ABNORMAL LOW (ref 0.44–1.00)
Creatinine, Ser: 0.42 mg/dL — ABNORMAL LOW (ref 0.44–1.00)
GFR calc Af Amer: >60 mL/min (ref >60)
GFR calc Af Amer: >60 mL/min (ref >60)
GFR calc non Af Amer: >60 mL/min (ref >60)
GFR calc non Af Amer: >60 mL/min (ref >60)
Glucose, Bld: 133 mg/dL — ABNORMAL HIGH (ref 70–99)
Glucose, Bld: 100 mg/dL — ABNORMAL HIGH (ref 70–99)
Potassium: 3.7 mmol/L (ref 3.5–5.1)
Potassium: 3.1 mmol/L — ABNORMAL LOW (ref 3.5–5.1)
Sodium: 144 mmol/L (ref 135–145)
Sodium: 141 mmol/L (ref 135–145)
Total Bilirubin: 2.5 mg/dL — ABNORMAL HIGH (ref 0.3–1.2)
Total Bilirubin: 1 mg/dL (ref 0.3–1.2)
Total Protein: 8.1 g/dL (ref 6.5–8.1)
Total Protein: 7.2 g/dL (ref 6.5–8.1)

## 2019-08-24 LAB — CBC WITH DIFFERENTIAL/PLATELET
Abs Immature Granulocytes: 0.01 10*3/uL (ref 0.00–0.07)
Basophils Absolute: 0.1 10*3/uL (ref 0.0–0.1)
Basophils Relative: 2 %
Eosinophils Absolute: 0 10*3/uL (ref 0.0–0.5)
Eosinophils Relative: 0 %
HCT: 38.5 % (ref 36.0–46.0)
Hemoglobin: 13 g/dL (ref 12.0–15.0)
Immature Granulocytes: 0 %
Lymphocytes Relative: 35 %
Lymphs Abs: 2.5 10*3/uL (ref 0.7–4.0)
MCH: 30.2 pg (ref 26.0–34.0)
MCHC: 33.8 g/dL (ref 30.0–36.0)
MCV: 89.5 fL (ref 80.0–100.0)
Monocytes Absolute: 0.3 10*3/uL (ref 0.1–1.0)
Monocytes Relative: 4 %
Neutro Abs: 4.2 10*3/uL (ref 1.7–7.7)
Neutrophils Relative %: 59 %
Platelets: 254 10*3/uL (ref 150–400)
RBC: 4.3 MIL/uL (ref 3.87–5.11)
RDW: 15 % (ref 11.5–15.5)
WBC: 7.1 10*3/uL (ref 4.0–10.5)
nRBC: 0 % (ref 0.0–0.2)

## 2019-08-24 LAB — SARS CORONAVIRUS 2 BY RT PCR (HOSPITAL ORDER, PERFORMED IN ~~LOC~~ HOSPITAL LAB): SARS Coronavirus 2: NEGATIVE

## 2019-08-24 LAB — URINE DRUG SCREEN, QUALITATIVE (ARMC ONLY)
Amphetamines, Ur Screen: NOT DETECTED
Barbiturates, Ur Screen: NOT DETECTED
Benzodiazepine, Ur Scrn: NOT DETECTED
Cannabinoid 50 Ng, Ur ~~LOC~~: POSITIVE — AB
Cocaine Metabolite,Ur ~~LOC~~: NOT DETECTED
MDMA (Ecstasy)Ur Screen: NOT DETECTED
Methadone Scn, Ur: NOT DETECTED
Opiate, Ur Screen: NOT DETECTED
Phencyclidine (PCP) Ur S: NOT DETECTED
Tricyclic, Ur Screen: POSITIVE — AB

## 2019-08-24 LAB — CBC
HCT: 31.2 % — ABNORMAL LOW (ref 36.0–46.0)
Hemoglobin: 10.5 g/dL — ABNORMAL LOW (ref 12.0–15.0)
MCH: 30.3 pg (ref 26.0–34.0)
MCHC: 33.7 g/dL (ref 30.0–36.0)
MCV: 89.9 fL (ref 80.0–100.0)
Platelets: 168 10*3/uL (ref 150–400)
RBC: 3.47 MIL/uL — ABNORMAL LOW (ref 3.87–5.11)
RDW: 14.8 % (ref 11.5–15.5)
WBC: 7.1 10*3/uL (ref 4.0–10.5)
nRBC: 0 % (ref 0.0–0.2)

## 2019-08-24 LAB — PROCALCITONIN: Procalcitonin: <0.1 ng/mL

## 2019-08-24 LAB — URINALYSIS, ROUTINE W REFLEX MICROSCOPIC
Bilirubin Urine: NEGATIVE
Glucose, UA: NEGATIVE mg/dL
Ketones, ur: NEGATIVE mg/dL
Nitrite: POSITIVE — AB
Protein, ur: NEGATIVE mg/dL
Specific Gravity, Urine: 1.015 (ref 1.005–1.030)
pH: 6 (ref 5.0–8.0)

## 2019-08-24 LAB — POCT PREGNANCY, URINE: Preg Test, Ur: NEGATIVE

## 2019-08-24 LAB — MAGNESIUM: Magnesium: 1.4 mg/dL — ABNORMAL LOW (ref 1.7–2.4)

## 2019-08-24 LAB — SARS CORONAVIRUS 2 (TAT 6-24 HRS): SARS Coronavirus 2: NEGATIVE

## 2019-08-24 LAB — LIPASE, BLOOD: Lipase: 18 U/L (ref 11–51)

## 2019-08-24 LAB — ETHANOL: Alcohol, Ethyl (B): 399 mg/dL (ref <10)

## 2019-08-24 LAB — PHOSPHORUS: Phosphorus: 2.7 mg/dL (ref 2.5–4.6)

## 2019-08-24 MED ORDER — ACETAMINOPHEN 325 MG PO TABS: 650.0000 mg | ORAL_TABLET | Freq: Four times a day (QID) | ORAL | Status: DC | PRN

## 2019-08-24 MED ORDER — HYDROMORPHONE HCL 1 MG/ML IJ SOLN
1.0000 mg | Freq: Once | INTRAMUSCULAR | Status: AC
  Administered 2019-08-24: 04:00:00 1 mg via INTRAVENOUS
  Filled 2019-08-24: qty 1

## 2019-08-24 MED ORDER — MAGNESIUM SULFATE 2 GM/50ML IV SOLN
2.0000 g | Freq: Once | INTRAVENOUS | Status: DC
  Filled 2019-08-24: qty 50

## 2019-08-24 MED ORDER — CHLORDIAZEPOXIDE HCL 25 MG PO CAPS
25.0000 mg | ORAL_CAPSULE | Freq: Four times a day (QID) | ORAL | Status: AC
  Administered 2019-08-24 – 2019-08-25 (×2): 25 mg via ORAL
  Filled 2019-08-24 (×3): qty 1

## 2019-08-24 MED ORDER — POTASSIUM PHOSPHATES 15 MMOLE/5ML IV SOLN
20.0000 mmol | Freq: Once | INTRAVENOUS | Status: DC
  Filled 2019-08-24: qty 6.67

## 2019-08-24 MED ORDER — HYDRALAZINE HCL 20 MG/ML IJ SOLN
INTRAMUSCULAR | Status: AC
  Filled 2019-08-24: qty 1

## 2019-08-24 MED ORDER — SODIUM CHLORIDE 0.9 % IV SOLN
1.0000 g | INTRAVENOUS | Status: DC
  Filled 2019-08-24: qty 10

## 2019-08-24 MED ORDER — ENOXAPARIN SODIUM 40 MG/0.4ML ~~LOC~~ SOLN
40.0000 mg | SUBCUTANEOUS | Status: DC
  Administered 2019-08-25 – 2019-08-26 (×2): 40 mg via SUBCUTANEOUS
  Filled 2019-08-24 (×2): qty 0.4

## 2019-08-24 MED ORDER — CHLORDIAZEPOXIDE HCL 25 MG PO CAPS
25.0000 mg | ORAL_CAPSULE | Freq: Three times a day (TID) | ORAL | Status: AC
  Administered 2019-08-25 (×2): 25 mg via ORAL
  Filled 2019-08-24 (×2): qty 1

## 2019-08-24 MED ORDER — SODIUM CHLORIDE 0.9% FLUSH
10.0000 mL | Freq: Two times a day (BID) | INTRAVENOUS | Status: DC
  Administered 2019-08-24 – 2019-08-28 (×7): 10 mL

## 2019-08-24 MED ORDER — ONDANSETRON HCL 4 MG/2ML IJ SOLN
4.0000 mg | Freq: Four times a day (QID) | INTRAMUSCULAR | Status: DC | PRN
  Administered 2019-08-24 – 2019-08-26 (×4): 4 mg via INTRAVENOUS
  Filled 2019-08-24 (×3): qty 2

## 2019-08-24 MED ORDER — SODIUM CHLORIDE 0.9% FLUSH: 10.0000 mL | INTRAVENOUS | Status: DC | PRN

## 2019-08-24 MED ORDER — MORPHINE SULFATE (PF) 2 MG/ML IV SOLN
2.0000 mg | INTRAVENOUS | Status: DC | PRN
  Administered 2019-08-24 – 2019-08-25 (×8): 2 mg via INTRAVENOUS
  Filled 2019-08-24 (×7): qty 1

## 2019-08-24 MED ORDER — POTASSIUM CHLORIDE CRYS ER 20 MEQ PO TBCR
40.0000 meq | EXTENDED_RELEASE_TABLET | ORAL | Status: AC
  Administered 2019-08-24 (×2): 40 meq via ORAL
  Filled 2019-08-24 (×2): qty 2

## 2019-08-24 MED ORDER — CHLORDIAZEPOXIDE HCL 25 MG PO CAPS
25.0000 mg | ORAL_CAPSULE | ORAL | Status: AC
  Administered 2019-08-26: 21:00:00 25 mg via ORAL
  Filled 2019-08-24 (×2): qty 1

## 2019-08-24 MED ORDER — ONDANSETRON 4 MG PO TBDP: 4.0000 mg | ORAL_TABLET | Freq: Four times a day (QID) | ORAL | Status: DC | PRN

## 2019-08-24 MED ORDER — CHLORHEXIDINE GLUCONATE CLOTH 2 % EX PADS
6.0000 | MEDICATED_PAD | Freq: Every day | CUTANEOUS | Status: DC
  Administered 2019-08-26 – 2019-08-28 (×3): 6 via TOPICAL

## 2019-08-24 MED ORDER — NALOXONE HCL 4 MG/0.1ML NA LIQD: 1.0000 | NASAL | Status: DC

## 2019-08-24 MED ORDER — CHLORDIAZEPOXIDE HCL 25 MG PO CAPS
25.0000 mg | ORAL_CAPSULE | Freq: Four times a day (QID) | ORAL | Status: AC | PRN
  Administered 2019-08-26: 25 mg via ORAL

## 2019-08-24 MED ORDER — THIAMINE HCL 100 MG/ML IJ SOLN: 100.0000 mg | Freq: Every day | INTRAMUSCULAR | Status: DC

## 2019-08-24 MED ORDER — HYDROXYZINE HCL 25 MG PO TABS
25.0000 mg | ORAL_TABLET | Freq: Four times a day (QID) | ORAL | Status: AC | PRN
  Filled 2019-08-24: qty 1

## 2019-08-24 MED ORDER — ADULT MULTIVITAMIN W/MINERALS CH
1.0000 | ORAL_TABLET | Freq: Every day | ORAL | Status: DC
  Administered 2019-08-25 – 2019-08-28 (×4): 1 via ORAL
  Filled 2019-08-24 (×4): qty 1

## 2019-08-24 MED ORDER — LORAZEPAM 1 MG PO TABS
1.0000 mg | ORAL_TABLET | ORAL | Status: DC | PRN
  Administered 2019-08-24: 2 mg via ORAL
  Administered 2019-08-24 (×2): 1 mg via ORAL
  Administered 2019-08-24 – 2019-08-25 (×4): 2 mg via ORAL
  Administered 2019-08-25: 4 mg via ORAL
  Filled 2019-08-24: qty 2
  Filled 2019-08-24 (×2): qty 1
  Filled 2019-08-24 (×4): qty 2
  Filled 2019-08-24: qty 4
  Filled 2019-08-24: qty 2

## 2019-08-24 MED ORDER — LORAZEPAM BOLUS VIA INFUSION: 1.0000 mg | INTRAVENOUS | Status: DC | PRN

## 2019-08-24 MED ORDER — CHLORDIAZEPOXIDE HCL 25 MG PO CAPS
25.0000 mg | ORAL_CAPSULE | Freq: Every day | ORAL | Status: AC
  Administered 2019-08-27: 09:00:00 25 mg via ORAL
  Filled 2019-08-24: qty 1

## 2019-08-24 MED ORDER — CLONIDINE HCL 0.1 MG PO TABS
0.2000 mg | ORAL_TABLET | Freq: Three times a day (TID) | ORAL | Status: DC | PRN
  Administered 2019-08-24: 17:00:00 0.2 mg via ORAL
  Filled 2019-08-24: qty 2

## 2019-08-24 MED ORDER — CLONIDINE HCL 0.1 MG PO TABS
0.1000 mg | ORAL_TABLET | Freq: Two times a day (BID) | ORAL | Status: DC
  Administered 2019-08-24 – 2019-08-28 (×8): 0.1 mg via ORAL
  Filled 2019-08-24 (×8): qty 1

## 2019-08-24 MED ORDER — SODIUM CHLORIDE 0.9 % IV BOLUS
1000.0000 mL | Freq: Once | INTRAVENOUS | Status: AC
  Administered 2019-08-24: 1000 mL via INTRAVENOUS

## 2019-08-24 MED ORDER — METOPROLOL TARTRATE 25 MG PO TABS
25.0000 mg | ORAL_TABLET | Freq: Two times a day (BID) | ORAL | Status: DC
  Administered 2019-08-24 – 2019-08-28 (×7): 25 mg via ORAL
  Filled 2019-08-24 (×7): qty 1

## 2019-08-24 MED ORDER — LORAZEPAM 2 MG/ML IJ SOLN: 1.0000 mg | INTRAMUSCULAR | Status: DC | PRN

## 2019-08-24 MED ORDER — OXYCODONE-ACETAMINOPHEN 5-325 MG PO TABS: 1.0000 | ORAL_TABLET | Freq: Three times a day (TID) | ORAL | Status: DC | PRN

## 2019-08-24 MED ORDER — ONDANSETRON HCL 4 MG/2ML IJ SOLN
INTRAMUSCULAR | Status: AC
  Administered 2019-08-25: 4 mg via INTRAVENOUS
  Filled 2019-08-24: qty 2

## 2019-08-24 MED ORDER — KETOROLAC TROMETHAMINE 30 MG/ML IJ SOLN
30.0000 mg | Freq: Once | INTRAMUSCULAR | Status: AC
  Administered 2019-08-24: 30 mg via INTRAVENOUS
  Filled 2019-08-24: qty 1

## 2019-08-24 MED ORDER — POLYETHYLENE GLYCOL 3350 17 G PO PACK
17.0000 g | PACK | Freq: Every day | ORAL | Status: DC
  Administered 2019-08-26: 10:00:00 17 g via ORAL
  Filled 2019-08-24 (×2): qty 1

## 2019-08-24 MED ORDER — VITAMIN B-1 100 MG PO TABS: 100.0000 mg | ORAL_TABLET | Freq: Every day | ORAL | Status: DC

## 2019-08-24 MED ORDER — VITAMIN B-1 100 MG PO TABS
100.0000 mg | ORAL_TABLET | Freq: Every day | ORAL | Status: DC
  Administered 2019-08-25 – 2019-08-28 (×4): 100 mg via ORAL
  Filled 2019-08-24 (×4): qty 1

## 2019-08-24 MED ORDER — LABETALOL HCL 5 MG/ML IV SOLN
20.0000 mg | INTRAVENOUS | Status: DC | PRN
  Administered 2019-08-24 – 2019-08-26 (×3): 20 mg via INTRAVENOUS
  Filled 2019-08-24 (×3): qty 4

## 2019-08-24 MED ORDER — ONDANSETRON 4 MG PO TBDP: 4.0000 mg | ORAL_TABLET | Freq: Three times a day (TID) | ORAL | Status: DC | PRN

## 2019-08-24 MED ORDER — LORAZEPAM 2 MG/ML IJ SOLN
1.0000 mg | Freq: Once | INTRAMUSCULAR | Status: AC
  Administered 2019-08-24: 1 mg via INTRAVENOUS
  Filled 2019-08-24: qty 1

## 2019-08-24 MED ORDER — ENOXAPARIN SODIUM 40 MG/0.4ML ~~LOC~~ SOLN: 40.0000 mg | SUBCUTANEOUS | Status: DC

## 2019-08-24 MED ORDER — METOPROLOL TARTRATE 25 MG PO TABS
12.5000 mg | ORAL_TABLET | Freq: Two times a day (BID) | ORAL | Status: DC
  Administered 2019-08-24: 12.5 mg via ORAL
  Filled 2019-08-24 (×2): qty 1

## 2019-08-24 MED ORDER — PANTOPRAZOLE SODIUM 20 MG PO TBEC
20.0000 mg | DELAYED_RELEASE_TABLET | Freq: Every day | ORAL | Status: DC
  Administered 2019-08-25 – 2019-08-28 (×4): 20 mg via ORAL
  Filled 2019-08-24 (×5): qty 1

## 2019-08-24 MED ORDER — OXYCODONE-ACETAMINOPHEN 5-325 MG PO TABS
1.0000 | ORAL_TABLET | Freq: Two times a day (BID) | ORAL | Status: DC | PRN
  Administered 2019-08-24: 2 via ORAL
  Administered 2019-08-24 – 2019-08-25 (×2): 1 via ORAL
  Administered 2019-08-25 – 2019-08-28 (×2): 2 via ORAL
  Filled 2019-08-24: qty 1
  Filled 2019-08-24 (×2): qty 2
  Filled 2019-08-24: qty 1
  Filled 2019-08-24: qty 2

## 2019-08-24 MED ORDER — HYDRALAZINE HCL 20 MG/ML IJ SOLN
10.0000 mg | INTRAMUSCULAR | Status: AC
  Administered 2019-08-24: 10 mg via INTRAVENOUS

## 2019-08-24 MED ORDER — FOLIC ACID 1 MG PO TABS
1.0000 mg | ORAL_TABLET | Freq: Every day | ORAL | Status: DC
  Administered 2019-08-25 – 2019-08-28 (×4): 1 mg via ORAL
  Filled 2019-08-24 (×4): qty 1

## 2019-08-24 MED ORDER — GABAPENTIN 300 MG PO CAPS: 300.0000 mg | ORAL_CAPSULE | Freq: Two times a day (BID) | ORAL | Status: DC | PRN

## 2019-08-24 MED ORDER — HYDROXYZINE HCL 25 MG PO TABS: 25.0000 mg | ORAL_TABLET | Freq: Three times a day (TID) | ORAL | Status: DC | PRN

## 2019-08-24 MED ORDER — THIAMINE HCL 100 MG/ML IJ SOLN
Freq: Once | INTRAVENOUS | Status: AC
  Administered 2019-08-24: 22:00:00 via INTRAVENOUS
  Filled 2019-08-24 (×2): qty 1000

## 2019-08-24 MED ORDER — ADULT MULTIVITAMIN W/MINERALS CH: 1.0000 | ORAL_TABLET | Freq: Every day | ORAL | Status: DC

## 2019-08-24 MED ORDER — MORPHINE SULFATE (PF) 2 MG/ML IV SOLN
INTRAVENOUS | Status: AC
  Administered 2019-08-24: 2 mg via INTRAVENOUS
  Filled 2019-08-24: qty 1

## 2019-08-24 MED ORDER — LEVETIRACETAM 500 MG PO TABS
500.0000 mg | ORAL_TABLET | Freq: Two times a day (BID) | ORAL | Status: DC
  Administered 2019-08-24 – 2019-08-28 (×9): 500 mg via ORAL
  Filled 2019-08-24 (×10): qty 1

## 2019-08-24 MED ORDER — QUETIAPINE FUMARATE 200 MG PO TABS
200.0000 mg | ORAL_TABLET | Freq: Two times a day (BID) | ORAL | Status: DC
  Administered 2019-08-24 – 2019-08-28 (×8): 200 mg via ORAL
  Filled 2019-08-24 (×4): qty 1
  Filled 2019-08-24: qty 8
  Filled 2019-08-24 (×3): qty 1
  Filled 2019-08-24: qty 8
  Filled 2019-08-24: qty 1

## 2019-08-24 MED ORDER — SODIUM CHLORIDE 0.9 % IV SOLN
2.0000 g | Freq: Once | INTRAVENOUS | Status: AC
  Administered 2019-08-24: 06:00:00 2 g via INTRAVENOUS
  Filled 2019-08-24: qty 20

## 2019-08-24 MED ORDER — MORPHINE SULFATE (PF) 4 MG/ML IV SOLN
4.0000 mg | Freq: Once | INTRAVENOUS | Status: AC
  Administered 2019-08-24: 03:00:00 4 mg via INTRAVENOUS
  Filled 2019-08-24: qty 1

## 2019-08-24 MED ORDER — MAGNESIUM SULFATE 4 GM/100ML IV SOLN
4.0000 g | Freq: Once | INTRAVENOUS | Status: AC
  Administered 2019-08-24: 17:00:00 4 g via INTRAVENOUS
  Filled 2019-08-24: qty 100

## 2019-08-24 MED ORDER — METRONIDAZOLE IN NACL 5-0.79 MG/ML-% IV SOLN: 500.0000 mg | Freq: Once | INTRAVENOUS | Status: DC

## 2019-08-24 MED ORDER — DICYCLOMINE HCL 20 MG PO TABS
20.0000 mg | ORAL_TABLET | Freq: Three times a day (TID) | ORAL | Status: DC | PRN
  Filled 2019-08-24: qty 1

## 2019-08-24 MED ORDER — METRONIDAZOLE IN NACL 5-0.79 MG/ML-% IV SOLN
500.0000 mg | Freq: Three times a day (TID) | INTRAVENOUS | Status: DC
  Administered 2019-08-24 – 2019-08-28 (×12): 500 mg via INTRAVENOUS
  Filled 2019-08-24 (×14): qty 100

## 2019-08-24 MED ORDER — LOPERAMIDE HCL 2 MG PO CAPS: 2.0000 mg | ORAL_CAPSULE | ORAL | Status: AC | PRN

## 2019-08-24 NOTE — ED Notes (Signed)
Pt rang call bell, this RN answered. Pt states she is not sure what she called for, but would like more water. Pt with full glass of ice water at bedside. Pt informed water at side on bedside table. Pt verbalizes understanding.

## 2019-08-24 NOTE — Progress Notes (Signed)
Patient admitted to ICU from ER around 1330 today. Patient agitated, reporting 9/10 pain to back and requesting pain medications when awake, sleeping between care. Patient uncooperative with nursing care, refusing CHG wipes, blood pressure readings, blood draws, and other care tasks at this time. Medicated per orders for pain and CIWA protocol. Will continue to monitor.

## 2019-08-24 NOTE — ED Provider Notes (Signed)
[facility] Emergency Department Provider Note ____________________________________________   First MD Initiated Contact with Patient 08/24/19 0117     (approximate)  I have reviewed the triage vital signs and the nursing notes.   HISTORY  Chief Complaint Detox  Level 5 caveat: History of present illness limited due to intoxication  HPI Sarah Serrano is a 43 y.o. female with PMH as noted below including a history of alcohol abuse who presents primarily stating that she does not want to keep drinking so much and would like detox, but also reports back pain which is bilateral and across her lower back.  She denies abdominal pain, nausea or vomiting, or fever.  She states her last drink was around 5 PM.  Past Medical History:  Diagnosis Date  . Alcohol abuse   . Bipolar 1 disorder ([place])   . Pancreatitis   . Substance abuse ([place])   . Thrombocytopenia [facility])     Patient Active Problem List   Diagnosis Date Noted  . Encounter for competency evaluation 07/09/2019  . Pancytopenia ([place])   . Bipolar disorder ([place]) 07/08/2019  . Anemia   . Severe thrombocytopenia ([place])   . Pancreatitis 07/06/2019    Past Surgical History:  Procedure Laterality Date  . NO PAST SURGERIES      Prior to Admission medications   Medication Sig Start Date End Date Taking? Authorizing Provider  amoxicillin-clavulanate (AUGMENTIN) 875-125 MG tablet Take 1 tablet by mouth 2 (two) times daily for 7 days. 08/22/19 08/29/19 Yes Duffy Bruce, MD  dicyclomine (BENTYL) 20 MG tablet Take 1 tablet (20 mg total) by mouth 3 (three) times daily as needed for up to 7 days for spasms. 08/22/19 08/29/19 Yes Duffy Bruce, MD  levETIRAcetam (KEPPRA) 500 MG tablet Take 1 tablet (500 mg total) by mouth 2 (two) times daily. 07/14/19  Yes Fritzi Mandes, MD  metoprolol tartrate (LOPRESSOR) 25 MG tablet Take 0.5 tablets (12.5 mg total) by mouth 2 (two) times daily. 07/14/19  Yes Fritzi Mandes, MD   Multiple Vitamin (MULTIVITAMIN WITH MINERALS) TABS tablet Take 1 tablet by mouth daily. 07/14/19  Yes Fritzi Mandes, MD  ondansetron (ZOFRAN ODT) 4 MG disintegrating tablet Take 1 tablet (4 mg total) by mouth every 8 (eight) hours as needed for nausea or vomiting. 08/22/19  Yes Duffy Bruce, MD  oxyCODONE-acetaminophen (PERCOCET/ROXICET) 5-325 MG tablet Take 1 tablet by mouth every 8 (eight) hours as needed for severe pain. 07/14/19  Yes Fritzi Mandes, MD  pantoprazole (PROTONIX) 20 MG tablet Take 1 tablet (20 mg total) by mouth daily. 07/14/19  Yes Fritzi Mandes, MD  QUEtiapine (SEROQUEL) 200 MG tablet Take 1 tablet (200 mg total) by mouth 2 (two) times daily. 07/14/19  Yes Fritzi Mandes, MD  acetaminophen (TYLENOL) 325 MG tablet Take 650 mg by mouth every 6 (six) hours as needed for mild pain or fever.    [provider]  gabapentin (NEURONTIN) 300 MG capsule Take 300 mg by mouth 2 (two) times daily as needed (back pain).    [provider]  hydrOXYzine (ATARAX/VISTARIL) 25 MG tablet Take 25 mg by mouth 3 (three) times daily as needed for anxiety.    [provider]  naloxone [facility]) 4 MG/0.1ML LIQD nasal spray kit Place 1 spray into the nose as directed.    [provider]  polyethylene glycol (MIRALAX / GLYCOLAX) 17 g packet Take 17 g by mouth daily.    [provider]    Allergies Patient has no  known allergies.  Family History  Problem Relation Age of Onset  . Bipolar disorder Mother     Social History Social History   Tobacco Use  . Smoking status: Current Every Day Smoker    Packs/day: 0.50    Types: Cigarettes  . Smokeless tobacco: Never Used  Substance Use Topics  . Alcohol use: Yes  . Drug use: Yes    Types: Marijuana    Review of Systems Level 5 caveat: Review of systems limited due to alcohol intoxication Constitutional: No fever. Cardiovascular: Denies chest pain. Gastrointestinal: No vomiting or diarrhea.  Genitourinary:  Negative for dysuria.  Musculoskeletal: Positive for back pain. Skin: Negative for rash. Neurological: Negative for focal weakness or numbness.   ____________________________________________   PHYSICAL EXAM:  VITAL SIGNS: ED Triage Vitals  Enc Vitals Group     BP 08/24/19 0041 (!) 140/100     Pulse Rate 08/24/19 0041 (!) 116     Resp 08/24/19 0041 20     Temp 08/24/19 0041 97.8 F (36.6 C)     Temp Source 08/24/19 0041 Oral     SpO2 08/24/19 0041 95 %     Weight 08/24/19 0039 140 lb (63.5 kg)     Height 08/24/19 0039 5' 4"  (1.626 m)     Head Circumference --      Peak Flow --      Pain Score 08/24/19 0039 0     Pain Loc --      Pain Edu? --      Excl. in [place]? --     Constitutional: Alert, intoxicated appearing but oriented. Eyes: Conjunctivae are normal.  EOMI.  PERRLA. Head: Atraumatic. Nose: No congestion/rhinnorhea. Mouth/Throat: Mucous membranes are slightly dry.   Neck: Normal range of motion.  Cardiovascular: Tachycardic, regular rhythm. Good peripheral circulation. Respiratory: Normal respiratory effort.  No retractions.  Gastrointestinal: Soft and nontender. No distention.  Genitourinary: No CVA tenderness. Musculoskeletal: No lower extremity edema.  Extremities warm and well perfused.  No midline spinal tenderness.  Mild bilateral lumbar paraspinal muscle tenderness. Neurologic: Motor intact in all extremities. Skin:  Skin is warm and dry. No rash noted. Psychiatric: Somewhat odd affect.  Intermittently yelling out to the hallway, but then apologetic.    ____________________________________________   LABS (all labs ordered are listed, but only abnormal results are displayed)  Labs Reviewed  COMPREHENSIVE METABOLIC PANEL - Abnormal; Notable for the following components:      Result Value   Glucose, Bld 133 (*)    Creatinine, Ser 0.38 (*)    Calcium 8.0 (*)    AST 45 (*)    Total Bilirubin 2.5 (*)    Anion gap 17 (*)    All other components within  normal limits  ETHANOL - Abnormal; Notable for the following components:   Alcohol, Ethyl (B) 399 (*)    All other components within normal limits  URINALYSIS, ROUTINE W REFLEX MICROSCOPIC - Abnormal; Notable for the following components:   Color, Urine YELLOW (*)    APPearance HAZY (*)    Hgb urine dipstick SMALL (*)    Nitrite POSITIVE (*)    Leukocytes,Ua TRACE (*)    Bacteria, UA FEW (*)    All other components within normal limits  URINE DRUG SCREEN, QUALITATIVE ([facility] ONLY) - Abnormal; Notable for the following components:   Tricyclic, Ur Screen POSITIVE (*)    Cannabinoid 50 Ng, Ur [place] POSITIVE (*)    All other components within normal limits  SARS CORONAVIRUS  2 (TAT 6-24 HRS)  CBC WITH DIFFERENTIAL/PLATELET  LIPASE, BLOOD  POC URINE PREG, ED  POCT PREGNANCY, URINE   ____________________________________________  EKG   ____________________________________________  RADIOLOGY  CT abdomen: Wall thickening in the colon suggestive of colitis. CT lumbar spine: No acute abnormality  ____________________________________________   PROCEDURES  Procedure(s) performed: No  Procedures  Critical Care performed: No ____________________________________________   INITIAL IMPRESSION / ASSESSMENT AND PLAN / ED COURSE  Pertinent labs & imaging results that were available during my care of the patient were reviewed by me and considered in my medical decision making (see chart for details).  43 year old female with PMH as noted above including a history of alcohol abuse presents primarily stating that she feels she is drinking too much and wants detox, and also reports bilateral lower back pain.  Although the patient is awake and alert, history is somewhat limited due to her intoxication.  I reviewed the past medical records in [place].  The patient was just seen in the ED 2 days ago with alcohol intoxication as well as abdominal pain, nausea, and vomiting.  The symptoms resolved in  the ED.  CT abdomen showed possible mild colitis with no other acute findings.  Previously, she was admitted last month for pancreatitis.  On exam, the patient is alert and oriented although does appear intoxicated and has somewhat odd affect.  Neurologic exam is nonfocal.  She has no cutaneous findings on her lower back, and there is mild tenderness but not in the midline.  The abdomen is soft and nontender.  She is tachycardic with otherwise normal vital signs.  Overall, primary issue appears to be alcohol intoxication.  Differential for the back pain is musculoskeletal pain, UTI/pyelonephritis, or possible referred pain from the apparent colitis seen on recent CT.  There are no red flags for any spinal issue.  We will obtain lab work-up, give fluids, Ativan given the tachycardia which indicates that patient may be in impending withdrawal, analgesia, and reassess.  ----------------------------------------- 6:03 AM on 08/24/2019 -----------------------------------------  The lab work-up was unremarkable except for an alcohol level of 399.  The UA also demonstrated positive nitrates and WBCs, suggestive of possible pyelonephritis.  The patient continued to report persistent back pain that was refractory to Toradol and morphine, although she felt more comfortable after Dilaudid.  I therefore proceeded with a CT to evaluate for ureteral stone or other acute abnormality.  CT shows continued findings of colitis, but no other acute abnormalities.  Although the patient's vital signs have improved after fluids and Ativan, she is still at high risk to go into withdrawal.  Given the persistent pain, possible combination of pyelonephritis and colitis, and the patient's alcohol dependence, I will admit for IV antibiotics, and continued fluids and treatment of withdrawal.  I signed the patient out to the hospitalist Dr. Sidney Ace.  _________________________  Ronalee Red was evaluated in Emergency  Department on 08/24/2019 for the symptoms described in the history of present illness. She was evaluated in the context of the global COVID-19 pandemic, which necessitated consideration that the patient might be at risk for infection with the SARS-CoV-2 virus that causes COVID-19. Institutional protocols and algorithms that pertain to the evaluation of patients at risk for COVID-19 are in a state of rapid change based on information released by regulatory bodies including the CDC and federal and state organizations. These policies and algorithms were followed during the patient's care in the ED. ____________________________________________   FINAL CLINICAL IMPRESSION(S) / ED DIAGNOSES  Final diagnoses:  Back pain  Acute pyelonephritis  Alcohol abuse  Colitis      NEW MEDICATIONS STARTED DURING THIS VISIT:  New Prescriptions   No medications on file     Note:  This document was prepared using Dragon voice recognition software and may include unintentional dictation errors.    Arta Silence, MD 08/24/19 [phone]

## 2019-08-24 NOTE — ED Notes (Addendum)
Pt c/o of upper back pain; reports wanting detox from ETOH  Pt smells of ETOH  Pt reports WD from ETOH prompted seizures

## 2019-08-24 NOTE — Progress Notes (Signed)
Consult was placed to IV Team to restart an iv that apparently came out accidentally;  Attempted x 1 in RAFA with ultrasound; unable to thread catheter; both arms assessed thoroughly with ultrasound; POOR veins; pt stated she's had "an IV in my neck before;"  RN aware of no access;

## 2019-08-24 NOTE — ED Notes (Signed)
Entered room to give BP medication at this time. Pt states she wants to leave and would be more comfortable at home in her own bed. Pt advised that she should stay. Pt requesting to leave. Will inform Writer

## 2019-08-24 NOTE — ED Notes (Signed)
EDT Scott at bedside and states pt's IV has been pulled out. Apparently the cord got stuck around the bed when pt was going to bathroom. RN Chief Technology Officer informed.

## 2019-08-24 NOTE — ED Notes (Signed)
IV team at bedside 

## 2019-08-24 NOTE — ED Notes (Signed)
This RN informed admitting doctor of blood pressure changes - 159/119. This RN explained medicine given to treat blood pressure - metoprolol and clonidine. Ativan 1mg  was also given for CIWA score of 7. Admitting doctor advised this RN to give hydralazine to get diastolic pressure lower. Per admitting doctor blood pressure needs to be reevaluated in 27mins. Pt still stating she wants something for pain and states pain has been unchanged and is 10/10. Pt advised that morphine was given at 0700 and oxy was given at 0600. Pt made aware that these medicines can not be given prior to the recommended time. Pt stating I may be better off going home, I'm tired of waiting and I want to smoke. Pt advised that a nicotine patch can be given. Pt states "that won't do anything." Camera operator, [place], notified.

## 2019-08-24 NOTE — ED Triage Notes (Signed)
Patient requesting help to stom drinking.  Reports last drank Saturday.

## 2019-08-24 NOTE — ED Notes (Signed)
Attempted to call report. Pt states she wants to leave AMA. Charge nurse notified.

## 2019-08-24 NOTE — Progress Notes (Signed)
Dining services called x3 for missing dinner tray for patient, last call at 1830, dining states the tray would be up shortly. Went to notify patient and she is asleep in bed.

## 2019-08-24 NOTE — ED Notes (Addendum)
MD admitting at bedside, pt given BP medication. Per Admit message him in about 48 and let him know pt's current status.

## 2019-08-24 NOTE — ED Notes (Signed)
Admitting MD informed via messanger concerning pt's current situation. Waiting on response

## 2019-08-24 NOTE — ED Notes (Signed)
Pt offered Tylenol, pt states it hasn't helped yet and that is what she was taking at home.

## 2019-08-24 NOTE — ED Notes (Addendum)
Per ICU pt needs another COVID swab performed RAPID before coming up. IV team unable to gain access. RN Yong Channel RN Brandy to take a look.
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
## 2019-08-24 NOTE — H&P (Signed)
[facility] [place] NAME: Sarah Serrano    MR#:  [id number]  DATE OF BIRTH:  April 10, 1976  DATE OF ADMISSION:  08/24/2019  PRIMARY CARE PHYSICIAN: Patient, No Pcp Per   REQUESTING/REFERRING PHYSICIAN: Arta Silence, MD CHIEF COMPLAINT:   Chief Complaint  Patient presents with  . Detox    HISTORY OF PRESENT ILLNESS:  Ellieana Dolecki  is a 43 y.o. female with a known history of alcohol abuse, alcohol withdrawal seizures, bipolar disorder and pancreatitis, who presented to the emergency room with acute onset of left-sided flank pain with associated nausea and occasional vomiting and diarrhea as well as urinary frequency and urgency with occasional dysuria.  She denies any hematuria or fever or chills.  She has been having occasional cough.  She admits to dizziness without headache or blurred vision.  She stated that she has been drinking a lot lately as her aunt just died.  No chest pain or palpitations.  She was seen in the ER on 10/23 and diagnosed with colitis and was managed on outpatient basis. She desires to have alcohol detox.  Upon presentation to the emergency room, blood pressure was 140/100 with a pulse of 160 and otherwise normal vital signs.  Later blood pressure was 171/112.  Labs were remarkable for an AST of 45, anion gap of 17 and urine drug screen that was positive for cannabinoids and tricyclic's.  Urine pregnancy test was negative.  Her alcohol level was 399.   The patient was given IV Rocephin and flagyl, IV morphine sulfate, toradol and Dilaudid for pain. She will be admitted to a medically monitored bed for further management. PAST MEDICAL HISTORY:   Past Medical History:  Diagnosis Date  . Alcohol abuse   . Bipolar 1 disorder ([place])   . Pancreatitis   . Substance abuse ([place])   . Thrombocytopenia ([place])     PAST SURGICAL HISTORY:   Past Surgical History:  Procedure Laterality Date  . NO PAST SURGERIES      SOCIAL HISTORY:   Social History   Tobacco Use  . Smoking status: Current Every Day Smoker    Packs/day: 0.50    Types: Cigarettes  . Smokeless tobacco: Never Used  Substance Use Topics  . Alcohol use: Yes    FAMILY HISTORY:   Family History  Problem Relation Age of Onset  . Bipolar disorder Mother     DRUG ALLERGIES:  No Known Allergies  REVIEW OF SYSTEMS:   ROS As per history of present illness. All pertinent systems were reviewed above. Constitutional,  HEENT, cardiovascular, respiratory, GI, GU, musculoskeletal, neuro, psychiatric, endocrine,  integumentary and hematologic systems were reviewed and are otherwise  negative/unremarkable except for positive findings mentioned above in the HPI.   MEDICATIONS AT HOME:   Prior to Admission medications   Medication Sig Start Date End Date Taking? Authorizing Provider  amoxicillin-clavulanate (AUGMENTIN) 875-125 MG tablet Take 1 tablet by mouth 2 (two) times daily for 7 days. 08/22/19 08/29/19 Yes Duffy Bruce, MD  dicyclomine (BENTYL) 20 MG tablet Take 1 tablet (20 mg total) by mouth 3 (three) times daily as needed for up to 7 days for spasms. 08/22/19 08/29/19 Yes Duffy Bruce, MD  levETIRAcetam (KEPPRA) 500 MG tablet Take 1 tablet (500 mg total) by mouth 2 (two) times daily. 07/14/19  Yes Fritzi Mandes, MD  metoprolol tartrate (LOPRESSOR) 25 MG tablet Take 0.5 tablets (12.5 mg total) by mouth 2 (two) times daily. 07/14/19  Yes  Fritzi Mandes, MD  Multiple Vitamin (MULTIVITAMIN WITH MINERALS) TABS tablet Take 1 tablet by mouth daily. 07/14/19  Yes Fritzi Mandes, MD  ondansetron (ZOFRAN ODT) 4 MG disintegrating tablet Take 1 tablet (4 mg total) by mouth every 8 (eight) hours as needed for nausea or vomiting. 08/22/19  Yes Duffy Bruce, MD  oxyCODONE-acetaminophen (PERCOCET/ROXICET) 5-325 MG tablet Take 1 tablet by mouth every 8 (eight) hours as needed for severe pain. 07/14/19  Yes Fritzi Mandes, MD  pantoprazole (PROTONIX) 20 MG  tablet Take 1 tablet (20 mg total) by mouth daily. 07/14/19  Yes Fritzi Mandes, MD  QUEtiapine (SEROQUEL) 200 MG tablet Take 1 tablet (200 mg total) by mouth 2 (two) times daily. 07/14/19  Yes Fritzi Mandes, MD  acetaminophen (TYLENOL) 325 MG tablet Take 650 mg by mouth every 6 (six) hours as needed for mild pain or fever.    [provider]  gabapentin (NEURONTIN) 300 MG capsule Take 300 mg by mouth 2 (two) times daily as needed (back pain).    [provider]  hydrOXYzine (ATARAX/VISTARIL) 25 MG tablet Take 25 mg by mouth 3 (three) times daily as needed for anxiety.    [provider]  naloxone [facility]) 4 MG/0.1ML LIQD nasal spray kit Place 1 spray into the nose as directed.    [provider]  polyethylene glycol (MIRALAX / GLYCOLAX) 17 g packet Take 17 g by mouth daily.    [provider]      VITAL SIGNS:  Blood pressure (!) 182/109, pulse (!) 109, temperature 97.8 F (36.6 C), temperature source Oral, resp. rate 17, height _0  (1.626 m), weight 63.5 kg, last menstrual period 07/25/2019, SpO2 94 %.  PHYSICAL EXAMINATION:  Physical Exam  GENERAL:  43 y.o.-year-old AA female patient lying in the bed with no acute distress.  EYES: Pupils equal, round, reactive to light and accommodation. No scleral icterus. Extraocular muscles intact.  HEENT: Head atraumatic, normocephalic. Oropharynx and nasopharynx clear.  NECK:  Supple, no jugular venous distention. No thyroid enlargement, no tenderness.  LUNGS: Normal breath sounds bilaterally, no wheezing, rales,rhonchi or crepitation. No use of accessory muscles of respiration.  CARDIOVASCULAR: Regular rate and rhythm, S1, S2 normal. No murmurs, rubs, or gallops.  ABDOMEN: Soft, with suprapubic tenderness without rebound tenderness or guarding or rigidity. Bowel sounds present. No organomegaly or mass. She had left CVA tenderness. EXTREMITIES: No pedal edema, cyanosis, or clubbing.  NEUROLOGIC: Cranial nerves  II through XII are intact. Muscle strength 5/5 in all extremities. Sensation intact. Gait not checked. Slow speech.  PSYCHIATRIC: The patient is alert and oriented x 3.  Normal affect and good eye contact. SKIN: No obvious rash, lesion, or ulcer.   LABORATORY PANEL:   CBC Recent Labs  Lab 08/24/19 0047  WBC 7.1  HGB 13.0  HCT 38.5  PLT 254   ------------------------------------------------------------------------------------------------------------------  Chemistries  Recent Labs  Lab 08/24/19 0047  NA 144  K 3.7  CL 105  CO2 22  GLUCOSE 133*  BUN 6  CREATININE 0.38*  CALCIUM 8.0*  AST 45*  ALT 29  ALKPHOS 55  BILITOT 2.5*   ------------------------------------------------------------------------------------------------------------------  Cardiac Enzymes No results for input(s): TROPONINI in the last 168 hours. ------------------------------------------------------------------------------------------------------------------  RADIOLOGY:  Ct Abdomen Pelvis W Contrast  Result Date: 08/22/2019 CLINICAL DATA:  Abdominal pain, nausea, vomiting. EXAM: CT ABDOMEN AND PELVIS WITH CONTRAST TECHNIQUE: Multidetector CT imaging of the abdomen and pelvis was performed using the standard protocol following bolus administration of intravenous contrast. CONTRAST:  19m OMNIPAQUE IOHEXOL 300 MG/ML  SOLN COMPARISON:  07/09/2019 FINDINGS: Lower chest: Lung bases are clear. No effusions. Heart is normal size. Hepatobiliary: Enhancing lesion in the left hepatic lobe measures 2 cm and is unchanged since prior study and dating back to multiple previous studies. Diffuse fatty infiltration. Gallbladder unremarkable. Pancreas: No focal abnormality or ductal dilatation. Spleen: No focal abnormality.  Normal size. Adrenals/Urinary Tract: Scarring in the upper pole of the left kidney. No mass or hydronephrosis bilaterally. Adrenal glands and urinary bladder unremarkable. Stomach/Bowel: Mild wall  thickening within the ascending colon and transverse colon. The segments are decompressed and difficult to evaluate, but appearance is concerning for colitis. Stomach and small bowel decompressed, unremarkable. Vascular/Lymphatic: No evidence of aneurysm or adenopathy. Reproductive: Fibroid uterus, unchanged. Cystic area within the left adnexa measures up to 4.4 cm, unchanged. Other: No free fluid or free air. Musculoskeletal: No acute bony abnormality. IMPRESSION: While decompressed, the ascending colon and transverse colon appears thick walled. Appearance is concerning for infectious/inflammatory colitis. Fatty infiltration of the liver. Stable enhancing lesion within the left hepatic lobe, favor hemangioma. This is stable dating back to 2015 compatible with a benign process. Fibroid uterus. 4.4 cm cystic lesion in the left adnexa, also stable since 2015. Electronically Signed   By: KRolm BaptiseM.D.   On: 08/22/2019 08:07   Ct L-spine No Charge  Result Date: 08/24/2019 CLINICAL DATA:  Initial evaluation for acute lower back pain. EXAM: CT LUMBAR SPINE WITHOUT CONTRAST TECHNIQUE: Multidetector CT imaging of the lumbar spine was performed without intravenous contrast administration. Multiplanar CT image reconstructions were also generated. COMPARISON:  Prior CT from 08/22/2019. FINDINGS: Segmentation: Standard. Lowest well-formed disc space labeled the L5-S1 level. Alignment: Physiologic with preservation of the normal lumbar lordosis. No listhesis. Vertebrae: Vertebral body height maintained without evidence for acute or chronic fracture. Visualized sacrum and pelvis intact. SI joints approximated and symmetric. No discrete osseous lesions. Paraspinal and other soft tissues: No acute paraspinous soft tissue abnormality. Fibroid uterus partially visualized. Disc levels: L1-2:  Unremarkable. L2-3:  Unremarkable. L3-4:  Minor disc bulge.  No stenosis. L4-5:  Mild diffuse disc bulge.  No significant stenosis.  L5-S1: Mild diffuse disc bulge with annular calcification. No significant stenosis. IMPRESSION: 1. No acute abnormality within the lumbar spine. 2. Mild noncompressive disc bulging at L3-4 through L5-S1 without significant stenosis. Electronically Signed   By: BJeannine BogaM.D.   On: 08/24/2019 04:57   Ct Renal Stone Study  Result Date: 08/24/2019 CLINICAL DATA:  Initial evaluation for acute lower back and epigastric pain. EXAM: CT ABDOMEN AND PELVIS WITHOUT CONTRAST TECHNIQUE: Multidetector CT imaging of the abdomen and pelvis was performed following the standard protocol without IV contrast. COMPARISON:  Prior CT from 08/22/2019. FINDINGS: Lower chest: Partially visualized lung bases are clear. Hepatobiliary: Diffuse hypoattenuation of the liver consistent with steatosis. Previously identified left hepatic hemangioma faintly visible, better seen on recent contrast-enhanced study. Layering hyperdensity within the gallbladder lumen suspected to reflect vicarious excretion of contrast related to recent contrast enhanced study. Gallbladder otherwise unremarkable. No biliary dilatation. Pancreas: Pancreas within normal limits. Spleen: Spleen within normal limits. Adrenals/Urinary Tract: Adrenal glands are normal. Kidneys equal in size without evidence for nephrolithiasis or hydronephrosis. No radiopaque calculi seen along the course of either renal collecting system. No hydroureter. Subcentimeter hypodensity within the interpolar right kidney noted, too small the characterize. No other discrete renal lesions. Partially distended bladder within normal limits. No layering stones within the bladder lumen. Stomach/Bowel: Stomach within normal  limits. No evidence for bowel obstruction. Appendix within normal limits. Colon is diffusely decompressed with associated wall thickening about the ascending, transverse, and proximal descending colon, again somewhat suspicious for colitis, stable from previous. No other  acute inflammatory changes seen about the bowels. Vascular/Lymphatic: Intra-abdominal aorta of normal caliber. Mild atherosclerotic change. No adenopathy. Reproductive: Fibroid uterus again noted. 4.4 cm simple cystic lesion within the left adnexa noted, unchanged. Ovaries otherwise unremarkable. Other: No free air or fluid. No mesenteric or retroperitoneal hematoma. Musculoskeletal: No acute osseous finding. No discrete lytic or blastic osseous lesions. IMPRESSION: 1. Diffuse wall thickening about the ascending and transverse colon, again suspicious for possible acute colitis. Overall, appearance is unchanged relative to recent CT. 2. No other acute intra-abdominal or pelvic process. 3. No CT evidence for nephrolithiasis or obstructive uropathy. 4. Fibroid uterus. 5. 4.4 cm left adnexal cyst, stable from multiple previous exams, and most likely benign. Electronically Signed   By: Jeannine Boga M.D.   On: 08/24/2019 05:08      IMPRESSION AND PLAN:   1. Acute Colitis. IV Rocephin and Flagyl, IV hydration , pain management and stool studies.  2. UTI with possible left pyelonephritis. IV Rocephin and follow urine culture.  3. Hypertensive urgency. IV Labetalol prn and resume lopressor.  4. Alcohol intoxication. CIWA monitoring, prn Ativan and banana bag daily, scheduled Librium.  5. Seizure, likely alcohol withdrawal. Resume Keppra.  6. DVT Prophylaxis. SQ Lovenox.   All the records are reviewed and case discussed with ED provider. The plan of care was discussed in details with the patient (and family). I answered all questions. The patient agreed to proceed with the above mentioned plan. Further management will depend upon hospital course.   CODE STATUS: Full code  TOTAL TIME TAKING CARE OF THIS PATIENT: 50 minutes.    Christel Mormon M.D on 08/24/2019 at [place] AM  Pager - [phone]  After 6pm go to [email or web address] - Proofreader  [facility]   Office  [phone]  CC: Primary care physician; Patient, No Pcp Per   Note: This dictation was prepared with Dragon dictation along with smaller phrase technology. Any transcriptional errors that result from this process are unintentional.

## 2019-08-24 NOTE — ED Notes (Signed)
RN Claiborne Billings attempted to call floor for pt. Per floor they feel pt is not appropriate and wants to speak with [facility] first. RN Theadora Rama informed.

## 2019-08-24 NOTE — ED Notes (Signed)
RN Claiborne Billings at bedside, pt vomiting and now states she thinks she should stay. Calling floor at this time. Will inform admitting hospitalist.

## 2019-08-24 NOTE — ED Notes (Signed)
This Rn spoke with Junie Panning, RN to inform pt will be transferred up as soon as rapid COVID test comes back.

## 2019-08-24 NOTE — Progress Notes (Signed)
[name] at [place] NAME: Sarah Serrano    MR#:  [id number]  DATE OF BIRTH:  [birth year]  SUBJECTIVE:  CHIEF COMPLAINT:   Chief Complaint  Patient presents with   Detox   Patient admitted this morning with colitis.  Subsequently reported to have had some nausea and vomiting in the emergency room.  Given Zofran.  Due to elevated blood pressure after several blood pressure meds, patient could not be admitted to the general medical floor and had to be transferred to the stepdown unit for closer monitoring due to concern for possible alcohol withdrawal as well.  REVIEW OF SYSTEMS:  Review of Systems  Constitutional: Negative for chills and fever.  HENT: Negative for hearing loss and tinnitus.   Eyes: Negative for blurred vision and double vision.  Respiratory: Negative for cough and shortness of breath.   Cardiovascular: Negative for chest pain and palpitations.  Gastrointestinal: Positive for abdominal pain, nausea and vomiting. Negative for heartburn.  Genitourinary: Negative for dysuria and urgency.  Musculoskeletal: Positive for back pain. Negative for myalgias and neck pain.  Skin: Negative for itching and rash.  Neurological: Negative for dizziness and headaches.  Psychiatric/Behavioral: Negative for depression and hallucinations.    DRUG ALLERGIES:  No Known Allergies VITALS:  Blood pressure (!) 118/113, pulse 97, temperature 98.3 F (36.8 C), temperature source Oral, resp. rate 15, height 5\' 4"  (1.626 m), weight 63.5 kg, last menstrual period 07/25/2019, SpO2 100 %. PHYSICAL EXAMINATION:  Physical Exam   GENERAL:  43 y.o.-year-old AA female patient lying in the bed with no acute distress.  EYES: Pupils equal, round, reactive to light and accommodation. No scleral icterus. Extraocular muscles intact.  HEENT: Head atraumatic, normocephalic. Oropharynx and nasopharynx clear.  NECK:  Supple, no jugular venous distention. No  thyroid enlargement, no tenderness.  LUNGS: Normal breath sounds bilaterally, no wheezing, rales,rhonchi or crepitation. No use of accessory muscles of respiration.  CARDIOVASCULAR: Regular rate and rhythm, S1, S2 normal. No murmurs, rubs, or gallops.  ABDOMEN: Soft, with suprapubic tenderness without rebound tenderness or guarding or rigidity. Bowel sounds present. No organomegaly or mass. She had left CVA tenderness. EXTREMITIES: No pedal edema, cyanosis, or clubbing.  NEUROLOGIC: Cranial nerves II through XII are intact. Muscle strength 5/5 in all extremities. Sensation intact. Gait not checked. Slow speech.  PSYCHIATRIC: The patient is alert and oriented x 3.  Normal affect and good eye contact. SKIN: No obvious rash, lesion, or ulcer LABORATORY PANEL:  Female CBC Recent Labs  Lab 08/24/19 0047  WBC 7.1  HGB 13.0  HCT 38.5  PLT 254   ------------------------------------------------------------------------------------------------------------------ Chemistries  Recent Labs  Lab 08/24/19 0047  NA 144  K 3.7  CL 105  CO2 22  GLUCOSE 133*  BUN 6  CREATININE 0.38*  CALCIUM 8.0*  AST 45*  ALT 29  ALKPHOS 55  BILITOT 2.5*   RADIOLOGY:  Ct L-spine No Charge  Result Date: 08/24/2019 CLINICAL DATA:  Initial evaluation for acute lower back pain. EXAM: CT LUMBAR SPINE WITHOUT CONTRAST TECHNIQUE: Multidetector CT imaging of the lumbar spine was performed without intravenous contrast administration. Multiplanar CT image reconstructions were also generated. COMPARISON:  Prior CT from 08/22/2019. FINDINGS: Segmentation: Standard. Lowest well-formed disc space labeled the L5-S1 level. Alignment: Physiologic with preservation of the normal lumbar lordosis. No listhesis. Vertebrae: Vertebral body height maintained without evidence for acute or chronic fracture. Visualized sacrum and pelvis intact. SI joints approximated and symmetric. No discrete osseous lesions.  Paraspinal and other soft  tissues: No acute paraspinous soft tissue abnormality. Fibroid uterus partially visualized. Disc levels: L1-2:  Unremarkable. L2-3:  Unremarkable. L3-4:  Minor disc bulge.  No stenosis. L4-5:  Mild diffuse disc bulge.  No significant stenosis. L5-S1: Mild diffuse disc bulge with annular calcification. No significant stenosis. IMPRESSION: 1. No acute abnormality within the lumbar spine. 2. Mild noncompressive disc bulging at L3-4 through L5-S1 without significant stenosis. Electronically Signed   By: Jeannine Boga M.D.   On: 08/24/2019 04:57   Ct Renal Stone Study  Result Date: 08/24/2019 CLINICAL DATA:  Initial evaluation for acute lower back and epigastric pain. EXAM: CT ABDOMEN AND PELVIS WITHOUT CONTRAST TECHNIQUE: Multidetector CT imaging of the abdomen and pelvis was performed following the standard protocol without IV contrast. COMPARISON:  Prior CT from 08/22/2019. FINDINGS: Lower chest: Partially visualized lung bases are clear. Hepatobiliary: Diffuse hypoattenuation of the liver consistent with steatosis. Previously identified left hepatic hemangioma faintly visible, better seen on recent contrast-enhanced study. Layering hyperdensity within the gallbladder lumen suspected to reflect vicarious excretion of contrast related to recent contrast enhanced study. Gallbladder otherwise unremarkable. No biliary dilatation. Pancreas: Pancreas within normal limits. Spleen: Spleen within normal limits. Adrenals/Urinary Tract: Adrenal glands are normal. Kidneys equal in size without evidence for nephrolithiasis or hydronephrosis. No radiopaque calculi seen along the course of either renal collecting system. No hydroureter. Subcentimeter hypodensity within the interpolar right kidney noted, too small the characterize. No other discrete renal lesions. Partially distended bladder within normal limits. No layering stones within the bladder lumen. Stomach/Bowel: Stomach within normal limits. No evidence for  bowel obstruction. Appendix within normal limits. Colon is diffusely decompressed with associated wall thickening about the ascending, transverse, and proximal descending colon, again somewhat suspicious for colitis, stable from previous. No other acute inflammatory changes seen about the bowels. Vascular/Lymphatic: Intra-abdominal aorta of normal caliber. Mild atherosclerotic change. No adenopathy. Reproductive: Fibroid uterus again noted. 4.4 cm simple cystic lesion within the left adnexa noted, unchanged. Ovaries otherwise unremarkable. Other: No free air or fluid. No mesenteric or retroperitoneal hematoma. Musculoskeletal: No acute osseous finding. No discrete lytic or blastic osseous lesions. IMPRESSION: 1. Diffuse wall thickening about the ascending and transverse colon, again suspicious for possible acute colitis. Overall, appearance is unchanged relative to recent CT. 2. No other acute intra-abdominal or pelvic process. 3. No CT evidence for nephrolithiasis or obstructive uropathy. 4. Fibroid uterus. 5. 4.4 cm left adnexal cyst, stable from multiple previous exams, and most likely benign. Electronically Signed   By: Jeannine Boga M.D.   On: 08/24/2019 05:08   ASSESSMENT AND PLAN:   1. Acute Colitis.  Patient currently on IV Rocephin and Flagyl, IV hydration , pain management and stool studies.  2. UTI with possible left pyelonephritis with complaint of flank pain. IV Rocephin and follow urine culture.  3. Hypertensive urgency. IV Labetalol prn and resume lopressor. Due to persistent elevation of blood pressure after several blood pressure medications, patient had to be transferred to stepdown unit.  4. Alcohol intoxication. CIWA monitoring, prn Ativan and banana bag daily, scheduled Librium. Patient with likely impending alcohol withdrawal.  Being transferred to stepdown unit for closer monitoring.  5. Seizure, likely alcohol withdrawal. Resume Keppra.  6. DVT Prophylaxis. SQ  Lovenox.   All the records are reviewed and case discussed with Care Management/Social Worker. Management plans discussed with the patient, family and they are in agreement.  CODE STATUS: Full Code  TOTAL TIME TAKING CARE OF THIS PATIENT: 62  minutes.   More than 50% of the time was spent in counseling/coordination of care: YES  POSSIBLE D/C IN 3 DAYS, DEPENDING ON CLINICAL CONDITION.   Fionna Merriott M.D on 08/24/2019 at 1:42 PM  Between 7am to 6pm - Pager - [phone]  After 6pm go to [email or web address] - Proofreader  [facility]  Office  [phone]  CC: Primary care physician; Patient, No Pcp Per  Note: This dictation was prepared with Dragon dictation along with smaller phrase technology. Any transcriptional errors that result from this process are unintentional.

## 2019-08-24 NOTE — Progress Notes (Signed)
Pharmacy Electrolyte Monitoring Consult:  Pharmacy consulted to assist in monitoring and replacing electrolytes in this 43 y.o. female admitted on 08/24/2019 admitted to ICU diaphoretic and with tremors. Patient is being treated for possible withdrawal. Patient with history significant for alcoholism, pancreatitis, colitis, heroin abuse, anemia, and PTSD.   Labs:  Sodium (mmol/L)  Date Value  08/24/2019 141  07/16/2014 145   Potassium (mmol/L)  Date Value  08/24/2019 3.1 (L)  07/16/2014 3.6   Magnesium (mg/dL)  Date Value  08/24/2019 1.4 (L)  11/22/2013 1.5 (L)   Phosphorus (mg/dL)  Date Value  08/24/2019 2.7  02/03/2013 1.7 (L)   Calcium (mg/dL)  Date Value  08/24/2019 7.8 (L)   Calcium, Total (mg/dL)  Date Value  07/16/2014 8.9   Albumin (g/dL)  Date Value  08/24/2019 3.3 (L)  07/16/2014 3.4   Corrected calcium : 8.4  Assessment/Plan: Potassium 44mEq PO x 2 doses. Magnesium 4g IV x 1.   Will continue to replace for goal potassium ~ 4, goal magnesium ~ 2, and goal phosphorus > 2.5.   Will obtain electrolytes with am labs.   Pharmacy will continue to monitor and adjust per consult.   Zorianna Taliaferro L 08/24/2019 4:25 PM

## 2019-08-24 NOTE — ED Notes (Signed)
Transporting pt to ICU 1 at this time. Will call and let them know Im on my way

## 2019-08-24 NOTE — ED Notes (Signed)
CRITICAL LAB: ETOH is 399, [employer], Dr. Cherylann Banas notified, orders received

## 2019-08-25 DIAGNOSIS — K529 Noninfective gastroenteritis and colitis, unspecified: Secondary | ICD-10-CM

## 2019-08-25 LAB — BASIC METABOLIC PANEL
Anion gap: 8 (ref 5–15)
BUN: <5 mg/dL — ABNORMAL LOW (ref 6–20)
CO2: 24 mmol/L (ref 22–32)
Calcium: 8 mg/dL — ABNORMAL LOW (ref 8.9–10.3)
Chloride: 106 mmol/L (ref 98–111)
Creatinine, Ser: 0.48 mg/dL (ref 0.44–1.00)
GFR calc Af Amer: >60 mL/min (ref >60)
GFR calc non Af Amer: >60 mL/min (ref >60)
Glucose, Bld: 96 mg/dL (ref 70–99)
Potassium: 4 mmol/L (ref 3.5–5.1)
Sodium: 138 mmol/L (ref 135–145)

## 2019-08-25 LAB — PROCALCITONIN: Procalcitonin: <0.1 ng/mL

## 2019-08-25 LAB — PHOSPHORUS: Phosphorus: 2.3 mg/dL — ABNORMAL LOW (ref 2.5–4.6)

## 2019-08-25 LAB — MAGNESIUM: Magnesium: 2.3 mg/dL (ref 1.7–2.4)

## 2019-08-25 MED ORDER — LORAZEPAM 1 MG PO TABS: 1.0000 mg | ORAL_TABLET | ORAL | Status: DC | PRN

## 2019-08-25 MED ORDER — MORPHINE SULFATE (PF) 2 MG/ML IV SOLN
2.0000 mg | INTRAVENOUS | Status: DC | PRN
  Administered 2019-08-25: 4 mg via INTRAVENOUS
  Administered 2019-08-26: 2 mg via INTRAVENOUS
  Administered 2019-08-26: 4 mg via INTRAVENOUS
  Administered 2019-08-26 (×2): 2 mg via INTRAVENOUS
  Administered 2019-08-26 (×2): 4 mg via INTRAVENOUS
  Administered 2019-08-27: 2 mg via INTRAVENOUS
  Administered 2019-08-27 (×2): 4 mg via INTRAVENOUS
  Administered 2019-08-27: 18:00:00 2 mg via INTRAVENOUS
  Administered 2019-08-28: 03:00:00 4 mg via INTRAVENOUS
  Filled 2019-08-25: qty 2
  Filled 2019-08-25: qty 1
  Filled 2019-08-25: qty 2
  Filled 2019-08-25: qty 1
  Filled 2019-08-25: qty 2
  Filled 2019-08-25 (×2): qty 1
  Filled 2019-08-25 (×5): qty 2

## 2019-08-25 MED ORDER — NICOTINE 14 MG/24HR TD PT24
14.0000 mg | MEDICATED_PATCH | Freq: Every day | TRANSDERMAL | Status: DC
  Administered 2019-08-25 – 2019-08-28 (×5): 14 mg via TRANSDERMAL
  Filled 2019-08-25 (×5): qty 1

## 2019-08-25 MED ORDER — SODIUM CHLORIDE 0.9 % IV SOLN
2.0000 g | INTRAVENOUS | Status: AC
  Administered 2019-08-25 – 2019-08-28 (×4): 2 g via INTRAVENOUS
  Filled 2019-08-25 (×4): qty 2

## 2019-08-25 MED ORDER — ENSURE ENLIVE PO LIQD
237.0000 mL | Freq: Two times a day (BID) | ORAL | Status: DC
  Administered 2019-08-26 – 2019-08-28 (×4): 237 mL via ORAL

## 2019-08-25 MED ORDER — NITROGLYCERIN 2 % TD OINT
1.0000 [in_us] | TOPICAL_OINTMENT | Freq: Four times a day (QID) | TRANSDERMAL | Status: DC
  Administered 2019-08-25 – 2019-08-28 (×11): 1 [in_us] via TOPICAL
  Filled 2019-08-25 (×12): qty 1

## 2019-08-25 MED ORDER — DEXMEDETOMIDINE HCL IN NACL 400 MCG/100ML IV SOLN
0.4000 ug/kg/h | INTRAVENOUS | Status: DC
  Administered 2019-08-25: 0.4 ug/kg/h via INTRAVENOUS
  Administered 2019-08-25: 17:00:00 1 ug/kg/h via INTRAVENOUS
  Administered 2019-08-25: 23:00:00 1.2 ug/kg/h via INTRAVENOUS
  Administered 2019-08-26: 0.901 ug/kg/h via INTRAVENOUS
  Administered 2019-08-26: 20:00:00 0.8 ug/kg/h via INTRAVENOUS
  Administered 2019-08-26 – 2019-08-27 (×4): 1 ug/kg/h via INTRAVENOUS
  Administered 2019-08-27: 1.2 ug/kg/h via INTRAVENOUS
  Administered 2019-08-27: 12:00:00 0.6 ug/kg/h via INTRAVENOUS
  Administered 2019-08-28: 04:00:00 1.2 ug/kg/h via INTRAVENOUS
  Administered 2019-08-28: 0.5 ug/kg/h via INTRAVENOUS
  Filled 2019-08-25 (×12): qty 100

## 2019-08-25 MED ORDER — LORAZEPAM 2 MG/ML IJ SOLN
1.0000 mg | INTRAMUSCULAR | Status: DC | PRN
  Administered 2019-08-26: 14:00:00 4 mg via INTRAVENOUS
  Administered 2019-08-26 (×3): 2 mg via INTRAVENOUS
  Administered 2019-08-26 (×2): 4 mg via INTRAVENOUS
  Administered 2019-08-26: 08:00:00 2 mg via INTRAVENOUS
  Administered 2019-08-27 (×5): 4 mg via INTRAVENOUS
  Administered 2019-08-27 (×3): 2 mg via INTRAVENOUS
  Administered 2019-08-27 – 2019-08-28 (×3): 4 mg via INTRAVENOUS
  Filled 2019-08-25: qty 2
  Filled 2019-08-25 (×2): qty 1
  Filled 2019-08-25 (×7): qty 2
  Filled 2019-08-25: qty 1
  Filled 2019-08-25: qty 2
  Filled 2019-08-25 (×4): qty 1
  Filled 2019-08-25: qty 2
  Filled 2019-08-25 (×2): qty 1

## 2019-08-25 MED ORDER — HYDROMORPHONE HCL 1 MG/ML IJ SOLN
1.0000 mg | Freq: Once | INTRAMUSCULAR | Status: AC
  Administered 2019-08-25: 1 mg via INTRAVENOUS
  Filled 2019-08-25: qty 1

## 2019-08-25 NOTE — Progress Notes (Signed)
Initial Nutrition Assessment  DOCUMENTATION CODES:   Not applicable  INTERVENTION:  Provide Ensure Enlive po BID, each supplement provides 350 kcal and 20 grams of protein. Patient prefers chocolate.  Continue MVI daily, folic acid 1 mg daily, thiamine 100 mg daily.  NUTRITION DIAGNOSIS:   Inadequate oral intake related to decreased appetite as evidenced by per patient/family report.  GOAL:   Patient will meet greater than or equal to 90% of their needs  MONITOR:   PO intake, Supplement acceptance, Labs, Weight trends, I & O's  REASON FOR ASSESSMENT:   Malnutrition Screening Tool    ASSESSMENT:   43 year old female with PMHx of bipolar 1 disorder, EtOH abuse, substance abuse, pancreatitis admitted with EtOH withdrawal, UTI, colitis.   Met with patient at bedside. She reports her appetite has been decreased for a while now in setting of abdominal pain. She tries to eat 3 meals per day but is not eating much at her meals. She is unable to provide any further details about intake. Patient enjoys chocolate Ensure and is willing to drink these to help meet calorie/protein needs.   Patient reports she is unsure of her weight trend because she does no weigh herself. She reports the weights in the chart are reported and she hasn't been weighed in a long time. She has noticed her clothes are fitting more loosely now.   Medications reviewed and include: folic acid 1 mg daily, Keppra, MVI daily, nicotine patch, pantoprazole, Miralax 17 grams daily, Seroquel, thiamine 100 mg daily, ceftriaxone, Precedex gtt, Flagyl.  Labs reviewed: BUN <5, Phosphorus 2.3.  Discussed with RN.  Patient does not meet criteria for malnutrition at this time.  NUTRITION - FOCUSED PHYSICAL EXAM:    Most Recent Value  Orbital Region  No depletion  Upper Arm Region  Mild depletion  Thoracic and Lumbar Region  No depletion  Buccal Region  No depletion  Temple Region  No depletion  Clavicle Bone Region   Mild depletion  Clavicle and Acromion Bone Region  No depletion  Scapular Bone Region  No depletion  Dorsal Hand  No depletion  Patellar Region  No depletion  Anterior Thigh Region  No depletion  Posterior Calf Region  No depletion  Edema (RD Assessment)  None  Hair  Reviewed  Eyes  Reviewed  Mouth  Reviewed  Skin  Reviewed  Nails  Reviewed     Diet Order:   Diet Order            Diet regular Room service appropriate? Yes; Fluid consistency: Thin  Diet effective now             EDUCATION NEEDS:   No education needs have been identified at this time  Skin:  Skin Assessment: Reviewed RN Assessment  Last BM:  08/25/2019 per chart  Height:   Ht Readings from Last 1 Encounters:  08/24/19 5' 4"  (1.626 m)   Weight:   Wt Readings from Last 1 Encounters:  08/24/19 63.5 kg   Ideal Body Weight:  54.5 kg  BMI:  Body mass index is 24.03 kg/m.  Estimated Nutritional Needs:   Kcal:  1600-1800  Protein:  80-90 grams  Fluid:  1.6-1.8 L/day  Willey Blade, MS, RD, LDN Office: [phone] Pager: [phone] After Hours/Weekend Pager: [phone]

## 2019-08-25 NOTE — Progress Notes (Signed)
Pharmacy Electrolyte Monitoring Consult:  Pharmacy consulted to assist in monitoring and replacing electrolytes in this 43 y.o. female admitted on 08/24/2019 admitted to ICU diaphoretic and with tremors. Patient is being treated for possible withdrawal. Patient with history significant for alcoholism, pancreatitis, colitis, heroin abuse, anemia, and PTSD.   Labs:  Sodium (mmol/L)  Date Value  08/25/2019 138  07/16/2014 145   Potassium (mmol/L)  Date Value  08/25/2019 4.0  07/16/2014 3.6   Magnesium (mg/dL)  Date Value  08/25/2019 2.3  11/22/2013 1.5 (L)   Phosphorus (mg/dL)  Date Value  08/25/2019 2.3 (L)  02/03/2013 1.7 (L)   Calcium (mg/dL)  Date Value  08/25/2019 8.0 (L)   Calcium, Total (mg/dL)  Date Value  07/16/2014 8.9   Albumin (g/dL)  Date Value  08/24/2019 3.3 (L)  07/16/2014 3.4   Corrected calcium : 8.56 mg/dL  Assessment/Plan:   continue to replace for goal potassium ~ 4, goal magnesium ~ 2, and goal phosphorus > 2.5  No electrolyte supplementation required today   Repeat electrolytes with am labs.   Pharmacy will continue to monitor and adjust per consult.   [name], PharmD 08/25/2019 7:19 AM

## 2019-08-25 NOTE — Progress Notes (Signed)
[facility] NAME: Sarah Serrano    MR#:  [id number]  DATE OF BIRTH:  Mar 31, 1976  SUBJECTIVE:  CHIEF COMPLAINT:   Chief Complaint  Patient presents with  . Detox   Patient admitted yesterday with colitis.  Had to be transferred to ICU due to concern for impending DT.  No new complaint this morning apart from complaints of pains.    REVIEW OF SYSTEMS:  Review of Systems  Constitutional: Negative for chills and fever.  HENT: Negative for hearing loss and tinnitus.   Eyes: Negative for blurred vision and double vision.  Respiratory: Negative for cough and shortness of breath.   Cardiovascular: Negative for chest pain and palpitations.  Gastrointestinal: Positive for abdominal pain. Negative for heartburn, nausea and vomiting.  Genitourinary: Negative for dysuria and urgency.  Musculoskeletal: Positive for back pain. Negative for myalgias and neck pain.  Skin: Negative for itching and rash.  Neurological: Negative for dizziness and headaches.  Psychiatric/Behavioral: Negative for depression and hallucinations.    DRUG ALLERGIES:  No Known Allergies VITALS:  Blood pressure (!) 137/106, pulse 81, temperature 98 F (36.7 C), temperature source Oral, resp. rate 15, height 5\' 4"  (1.626 m), weight 63.5 kg, last menstrual period 07/25/2019, SpO2 100 %. PHYSICAL EXAMINATION:  Physical Exam   GENERAL:  43 y.o.-year-old AA female patient lying in the bed with no acute distress.  EYES: Pupils equal, round, reactive to light and accommodation. No scleral icterus. Extraocular muscles intact.  HEENT: Head atraumatic, normocephalic. Oropharynx and nasopharynx clear.  NECK:  Supple, no jugular venous distention. No thyroid enlargement, no tenderness.  LUNGS: Normal breath sounds bilaterally, no wheezing, rales,rhonchi or crepitation. No use of accessory muscles of respiration.  CARDIOVASCULAR: Regular rate and rhythm, S1, S2 normal. No  murmurs, rubs, or gallops.  ABDOMEN: Soft, with suprapubic tenderness without rebound tenderness or guarding or rigidity. Bowel sounds present. No organomegaly or mass. She had left CVA tenderness. EXTREMITIES: No pedal edema, cyanosis, or clubbing.  NEUROLOGIC: Cranial nerves II through XII are intact. Muscle strength 5/5 in all extremities. Sensation intact. Gait not checked. Slow speech.  PSYCHIATRIC: The patient is alert and oriented x 3.  Normal affect and good eye contact. SKIN: No obvious rash, lesion, or ulcer LABORATORY PANEL:  Female CBC Recent Labs  Lab 08/24/19 1508  WBC 7.1  HGB 10.5*  HCT 31.2*  PLT 168   ------------------------------------------------------------------------------------------------------------------ Chemistries  Recent Labs  Lab 08/24/19 1508 08/25/19 0500  NA 141 138  K 3.1* 4.0  CL 101 106  CO2 24 24  GLUCOSE 100* 96  BUN 6 <5*  CREATININE 0.42* 0.48  CALCIUM 7.8* 8.0*  MG 1.4* 2.3  AST 57*  --   ALT 29  --   ALKPHOS 52  --   BILITOT 1.0  --    RADIOLOGY:  No results found. ASSESSMENT AND PLAN:   1. Acute Colitis.  Patient currently on IV Rocephin and Flagyl, IV hydration , pain management and stool studies.  2. UTI with left pyelonephritis with complaint of flank pain. IV Rocephin and follow urine culture.  3. Hypertensive urgency. IV Labetalol prn and resume lopressor. Due to persistent elevation of blood pressure after several blood pressure medications, patient had to be transferred to stepdown unit.  4. Alcohol intoxication on admission and now in withdrawal CIWA monitoring, prn Ativan and banana bag daily, scheduled Librium. Precedex as needed Being managed in the ICU  5. Seizure, likely alcohol  withdrawal. Resume Keppra.  DVT Prophylaxis. SQ Lovenox.   All the records are reviewed and case discussed with Care Management/Social Worker. Management plans discussed with the patient, and she is in agreement.  CODE  STATUS: Full Code  TOTAL TIME TAKING CARE OF THIS PATIENT: 31 minutes.   More than 50% of the time was spent in counseling/coordination of care: YES  POSSIBLE D/C IN 3 DAYS, DEPENDING ON CLINICAL CONDITION.   Sarah Serrano M.D on 08/25/2019 at 2:10 PM  Between 7am to 6pm - Pager - [phone]  After 6pm go to [email or web address] - Proofreader  [facility]  Office  [phone]  CC: Primary care physician; Patient, No Pcp Per  Note: This dictation was prepared with Dragon dictation along with smaller phrase technology. Any transcriptional errors that result from this process are unintentional.

## 2019-08-25 NOTE — Progress Notes (Signed)
CC  Follow up sepsis    HPI Severe back pain and abd pain        VITALS: BP (!) 115/98   Pulse (!) 102   Temp 98 F (36.7 C) (Oral)   Resp 20   Ht 5\' 4"  (1.626 m)   Wt 63.5 kg   LMP 07/25/2019 (Approximate)   SpO2 100%   BMI 24.03 kg/m     I/O last 3 completed shifts: In: 1527.8 [P.O.:480; I.V.:10; IV Piggyback:1037.8] Out: 600 [Urine:600] No intake/output data recorded.  SpO2: 100 %   Review of Systems:  Gen:  +sweats,  HEENT: Denies blurred vision, double vision, ear pain, eye pain, hearing loss, nose bleeds, sore throat Cardiac:  No dizziness, chest pain or heaviness, chest tightness,edema, No JVD Resp:   No cough, -sputum production, -shortness of breath,-wheezing, -hemoptysis,  Gi: Denies swallowing difficulty, stomach pain, nausea or vomiting, diarrhea, constipation, bowel incontinence Gu:  Denies bladder incontinence, burning urine Ext:   Denies Joint pain, stiffness or swelling Skin: Denies  skin rash, easy bruising or bleeding or hives Endoc:  Denies polyuria, polydipsia , polyphagia or weight change Psych:   Denies depression, insomnia or hallucinations  Other:  All other systems negative  Physical Examination:   GENERAL:ill appearing HEAD: Normocephalic, atraumatic.  EYES: PERLA, EOMI No scleral icterus.  MOUTH: Moist mucosal membrane.  EAR, NOSE, THROAT: Clear without exudates. No external lesions.  NECK: Supple.  PULMONARY: CTA B/L no wheezing, rhonchi, crackles CARDIOVASCULAR: S1 and S2. Regular rate and rhythm. No murmurs GASTROINTESTINAL: +tender, +distended. Positive bowel sounds.  MUSCULOSKELETAL: No swelling, clubbing, or edema.  NEUROLOGIC: No gross focal neurological deficits. 5/5 strength all extremities SKIN: No ulceration, lesions, rashes, or cyanosis.  PSYCHIATRIC: Insight, judgment intact. -depression +anxiety ALL OTHER ROS ARE NEGATIVE    I personally reviewed Labs under Results section.   MEDICATIONS: I have reviewed  all medications and confirmed regimen as documented  CBC    Component Value Date/Time   WBC 7.1 08/24/2019 1508   RBC 3.47 (L) 08/24/2019 1508   HGB 10.5 (L) 08/24/2019 1508   HGB 11.7 (L) 07/16/2014 0845   HCT 31.2 (L) 08/24/2019 1508   HCT 35.8 07/16/2014 0845   PLT 168 08/24/2019 1508   PLT 387 07/16/2014 0845   MCV 89.9 08/24/2019 1508   MCV 93 07/16/2014 0845   MCH 30.3 08/24/2019 1508   MCHC 33.7 08/24/2019 1508   RDW 14.8 08/24/2019 1508   RDW 14.4 07/16/2014 0845   LYMPHSABS 2.5 08/24/2019 0047   LYMPHSABS 1.2 07/16/2014 0845   MONOABS 0.3 08/24/2019 0047   MONOABS 0.2 07/16/2014 0845   EOSABS 0.0 08/24/2019 0047   EOSABS 0.0 07/16/2014 0845   BASOSABS 0.1 08/24/2019 0047   BASOSABS 0.1 07/16/2014 0845   BMP Latest Ref Rng & Units 08/25/2019 08/24/2019 08/24/2019  Glucose 70 - 99 mg/dL 96 100(H) 133(H)  BUN 6 - 20 mg/dL <5(L) 6 6  Creatinine 0.44 - 1.00 mg/dL 0.48 0.42(L) 0.38(L)  Sodium 135 - 145 mmol/L 138 141 144  Potassium 3.5 - 5.1 mmol/L 4.0 3.1(L) 3.7  Chloride 98 - 111 mmol/L 106 101 105  CO2 22 - 32 mmol/L 24 24 22   Calcium 8.9 - 10.3 mg/dL 8.0(L) 7.8(L) 8.0(L)     CULTURE RESULTS   Recent Results (from the past 240 hour(s))  SARS CORONAVIRUS 2 (TAT 6-24 HRS) Nasopharyngeal Nasopharyngeal Swab     Status: None   Collection Time: 08/24/19  6:04 AM   Specimen: Nasopharyngeal  Swab  Result Value Ref Range Status   SARS Coronavirus 2 NEGATIVE NEGATIVE Final    Comment: (NOTE) SARS-CoV-2 target nucleic acids are NOT DETECTED. The SARS-CoV-2 RNA is generally detectable in upper and lower respiratory specimens during the acute phase of infection. Negative results do not preclude SARS-CoV-2 infection, do not rule out co-infections with other pathogens, and should not be used as the sole basis for treatment or other patient management decisions. Negative results must be combined with clinical observations, patient history, and epidemiological  information. The expected result is Negative. Fact Sheet for Patients: SugarRoll.be Fact Sheet for Healthcare Providers: https://www.woods-mathews.com/ This test is not yet approved or cleared by the [place] FDA and  has been authorized for detection and/or diagnosis of SARS-CoV-2 by FDA under an Emergency Use Authorization (EUA). This EUA will remain  in effect (meaning this test can be used) for the duration of the COVID-19 declaration under Section 56 4(b)(1) of the Act, 21 U.S.C. section 360bbb-3(b)(1), unless the authorization is terminated or revoked sooner. Performed at [facility], [place] [address], [place], [place] [address]   SARS Coronavirus 2 by RT PCR (hospital order, performed in [facility] hospital lab) Nasopharyngeal Nasopharyngeal Swab     Status: None   Collection Time: 08/24/19 10:44 AM   Specimen: Nasopharyngeal Swab  Result Value Ref Range Status   SARS Coronavirus 2 NEGATIVE NEGATIVE Final    Comment: (NOTE) If result is NEGATIVE SARS-CoV-2 target nucleic acids are NOT DETECTED. The SARS-CoV-2 RNA is generally detectable in upper and lower  respiratory specimens during the acute phase of infection. The lowest  concentration of SARS-CoV-2 viral copies this assay can detect is 250  copies / mL. A negative result does not preclude SARS-CoV-2 infection  and should not be used as the sole basis for treatment or other  patient management decisions.  A negative result may occur with  improper specimen collection / handling, submission of specimen other  than nasopharyngeal swab, presence of viral mutation(s) within the  areas targeted by this assay, and inadequate number of viral copies  (<250 copies / mL). A negative result must be combined with clinical  observations, patient history, and epidemiological information. If result is POSITIVE SARS-CoV-2 target nucleic acids are DETECTED. The SARS-CoV-2 RNA is  generally detectable in upper and lower  respiratory specimens dur ing the acute phase of infection.  Positive  results are indicative of active infection with SARS-CoV-2.  Clinical  correlation with patient history and other diagnostic information is  necessary to determine patient infection status.  Positive results do  not rule out bacterial infection or co-infection with other viruses. If result is PRESUMPTIVE POSTIVE SARS-CoV-2 nucleic acids MAY BE PRESENT.   A presumptive positive result was obtained on the submitted specimen  and confirmed on repeat testing.  While 2019 novel coronavirus  (SARS-CoV-2) nucleic acids may be present in the submitted sample  additional confirmatory testing may be necessary for epidemiological  and / or clinical management purposes  to differentiate between  SARS-CoV-2 and other Sarbecovirus currently known to infect humans.  If clinically indicated additional testing with an alternate test  methodology [phone]) is advised. The SARS-CoV-2 RNA is generally  detectable in upper and lower respiratory sp ecimens during the acute  phase of infection. The expected result is Negative. Fact Sheet for Patients:  StrictlyIdeas.no Fact Sheet for Healthcare Providers: BankingDealers.co.za This test is not yet approved or cleared by the [place] FDA and has been authorized  for detection and/or diagnosis of SARS-CoV-2 by FDA under an Emergency Use Authorization (EUA).  This EUA will remain in effect (meaning this test can be used) for the duration of the COVID-19 declaration under Section 564(b)(1) of the Act, 21 U.S.C. section 360bbb-3(b)(1), unless the authorization is terminated or revoked sooner. Performed at [facility], [address] [place], [place] [address]   CULTURE, BLOOD (ROUTINE X 2) w Reflex to ID Panel     Status: None (Preliminary result)   Collection Time: 08/24/19  5:21 PM    Specimen: BLOOD  Result Value Ref Range Status   Specimen Description BLOOD BLOOD LEFT WRIST  Final   Special Requests   Final    BOTTLES DRAWN AEROBIC AND ANAEROBIC Blood Culture adequate volume   Culture   Final    NO GROWTH < 24 HOURS Performed at [facility], [address] [place], [place] [address]    Report Status PENDING  Incomplete  CULTURE, BLOOD (ROUTINE X 2) w Reflex to ID Panel     Status: None (Preliminary result)   Collection Time: 08/24/19  6:22 PM   Specimen: BLOOD  Result Value Ref Range Status   Specimen Description BLOOD RIGHT MIDLINE  Final   Special Requests   Final    BOTTLES DRAWN AEROBIC AND ANAEROBIC Blood Culture adequate volume   Culture   Final    NO GROWTH < 24 HOURS Performed at [facility], [address], [place], [place] [address]    Report Status PENDING  Incomplete              ASSESSMENT AND PLAN SYNOPSIS  UTI with pyelonephritis with infectious Colitis in setting of ETOH withdrawal Continue IV abx as prescribed Pain meds as needed  ALCOHOL WITHDRAWAL -Therapy with Thiamine and MVI -CIWA Protocol -Precedex as needed  Out of bed to chair as tolerated  SD status   Maretta Bees Patricia Pesa, M.D.  [facility]  Medical Director [place] Director [facility] Cardio-Pulmonary Department

## 2019-08-25 NOTE — Progress Notes (Signed)
Ch visited w/ pt while rounding in ICU. This is a 43 y.o. patient with history of alcoholism that presents to be having ETOH w/drawal. Pt was alert and oriented and shared that she is sleepy but cannot go to sleep right now. Ch is not sure how lucid pt is but the pt was able to respond to questions appropriately. Ch asked guided questions regarding the pt's appetite. Pt did not eat much for lunch as shared by pt's nurse but tolerated breakfast well. Pt presented to be restless and c/o having back and abdominal pain when she tries to lay down. Ch will f/u with pt later.    08/25/19 1200  Clinical Encounter Type  Visited With Patient;Health care provider  Visit Type Follow-up;Psychological support;Social support;Critical Care  Spiritual Encounters  Spiritual Needs Emotional;Grief support  Stress Factors  Patient Stress Factors Exhausted;Health changes;Loss of control  Family Stress Factors None identified

## 2019-08-26 LAB — PROCALCITONIN: Procalcitonin: <0.1 ng/mL

## 2019-08-26 LAB — RENAL FUNCTION PANEL
Albumin: 3 g/dL — ABNORMAL LOW (ref 3.5–5.0)
Anion gap: 9 (ref 5–15)
BUN: <5 mg/dL — ABNORMAL LOW (ref 6–20)
CO2: 23 mmol/L (ref 22–32)
Calcium: 8.6 mg/dL — ABNORMAL LOW (ref 8.9–10.3)
Chloride: 109 mmol/L (ref 98–111)
Creatinine, Ser: 0.48 mg/dL (ref 0.44–1.00)
GFR calc Af Amer: >60 mL/min (ref >60)
GFR calc non Af Amer: >60 mL/min (ref >60)
Glucose, Bld: 134 mg/dL — ABNORMAL HIGH (ref 70–99)
Phosphorus: 3.6 mg/dL (ref 2.5–4.6)
Potassium: 4 mmol/L (ref 3.5–5.1)
Sodium: 141 mmol/L (ref 135–145)

## 2019-08-26 LAB — CBC
HCT: 33.1 % — ABNORMAL LOW (ref 36.0–46.0)
Hemoglobin: 11.1 g/dL — ABNORMAL LOW (ref 12.0–15.0)
MCH: 30.1 pg (ref 26.0–34.0)
MCHC: 33.5 g/dL (ref 30.0–36.0)
MCV: 89.7 fL (ref 80.0–100.0)
Platelets: 115 10*3/uL — ABNORMAL LOW (ref 150–400)
RBC: 3.69 MIL/uL — ABNORMAL LOW (ref 3.87–5.11)
RDW: 14.1 % (ref 11.5–15.5)
WBC: 5.7 10*3/uL (ref 4.0–10.5)
nRBC: 0 % (ref 0.0–0.2)

## 2019-08-26 LAB — MAGNESIUM: Magnesium: 1.9 mg/dL (ref 1.7–2.4)

## 2019-08-26 MED ORDER — SODIUM CHLORIDE 0.9% FLUSH: 10.0000 mL | INTRAVENOUS | Status: DC | PRN

## 2019-08-26 MED ORDER — DIAZEPAM 5 MG PO TABS
5.0000 mg | ORAL_TABLET | Freq: Four times a day (QID) | ORAL | Status: DC
  Administered 2019-08-26 – 2019-08-28 (×9): 5 mg via ORAL
  Filled 2019-08-26 (×9): qty 1

## 2019-08-26 MED ORDER — HYDRALAZINE HCL 20 MG/ML IJ SOLN
20.0000 mg | Freq: Once | INTRAMUSCULAR | Status: AC
  Administered 2019-08-26: 20 mg via INTRAVENOUS
  Filled 2019-08-26: qty 1

## 2019-08-26 MED ORDER — SODIUM CHLORIDE 0.9% FLUSH
10.0000 mL | Freq: Two times a day (BID) | INTRAVENOUS | Status: DC
  Administered 2019-08-26 – 2019-08-28 (×4): 10 mL

## 2019-08-26 MED ORDER — HYDRALAZINE HCL 20 MG/ML IJ SOLN
10.0000 mg | Freq: Four times a day (QID) | INTRAMUSCULAR | Status: DC | PRN
  Administered 2019-08-27: 20 mg via INTRAVENOUS
  Filled 2019-08-26: qty 1
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
## 2019-08-26 NOTE — Progress Notes (Signed)
Pharmacy Electrolyte Monitoring Consult:  Pharmacy consulted to assist in monitoring and replacing electrolytes in this 43 y.o. female admitted on 08/24/2019 admitted to ICU diaphoretic and with tremors. Patient is being treated for possible withdrawal. Patient with history significant for alcoholism, pancreatitis, colitis, heroin abuse, anemia, and PTSD.   Labs:  Sodium (mmol/L)  Date Value  08/26/2019 141  07/16/2014 145   Potassium (mmol/L)  Date Value  08/26/2019 4.0  07/16/2014 3.6   Magnesium (mg/dL)  Date Value  08/26/2019 1.9  11/22/2013 1.5 (L)   Phosphorus (mg/dL)  Date Value  08/26/2019 3.6  02/03/2013 1.7 (L)   Calcium (mg/dL)  Date Value  08/26/2019 8.6 (L)   Calcium, Total (mg/dL)  Date Value  07/16/2014 8.9   Albumin (g/dL)  Date Value  08/26/2019 3.0 (L)  07/16/2014 3.4   Corrected calcium : 8.56 mg/dL  Assessment/Plan:   continue to replace for goal potassium ~ 4, goal magnesium ~ 2, and goal phosphorus > 2.5  No electrolyte supplementation required today   Repeat electrolytes with am labs.   Pharmacy will continue to monitor and adjust per consult.   [name], PharmD 08/26/2019 7:19 AM

## 2019-08-26 NOTE — Progress Notes (Signed)
[facility] [place] NAME: Taber Sankey    MR#:  [id number]  DATE OF BIRTH:  11-Nov-1975  SUBJECTIVE:  CHIEF COMPLAINT:   Chief Complaint  Patient presents with  . Detox   Patient pleasantly confused due to alcohol withdrawal.  Reported to have had breakfast this morning which patient does not remember.  REVIEW OF SYSTEMS:  Review of Systems  Constitutional: Negative for chills and fever.  HENT: Negative for hearing loss and tinnitus.   Eyes: Negative for blurred vision and double vision.  Respiratory: Negative for cough and shortness of breath.   Cardiovascular: Negative for chest pain and palpitations.  Gastrointestinal: Positive for abdominal pain. Negative for heartburn, nausea and vomiting.  Genitourinary: Negative for dysuria and urgency.  Musculoskeletal: Positive for back pain. Negative for myalgias and neck pain.  Skin: Negative for itching and rash.  Neurological: Negative for dizziness and headaches.  Psychiatric/Behavioral: Negative for depression and hallucinations.    DRUG ALLERGIES:  No Known Allergies VITALS:  Blood pressure (!) 120/98, pulse (!) 109, temperature 99.2 F (37.3 C), temperature source Axillary, resp. rate 17, height 5\' 4"  (1.626 m), weight 63.5 kg, last menstrual period 07/25/2019, SpO2 100 %. PHYSICAL EXAMINATION:  Physical Exam   GENERAL:  43 y.o.-year-old AA female patient lying in the bed with no acute distress.  EYES: Pupils equal, round, reactive to light and accommodation. No scleral icterus. Extraocular muscles intact.  HEENT: Head atraumatic, normocephalic. Oropharynx and nasopharynx clear.  NECK:  Supple, no jugular venous distention. No thyroid enlargement, no tenderness.  LUNGS: Normal breath sounds bilaterally, no wheezing, rales,rhonchi or crepitation. No use of accessory muscles of respiration.  CARDIOVASCULAR: Regular rate and rhythm, S1, S2 normal. No murmurs, rubs, or gallops.   ABDOMEN: Soft, with suprapubic tenderness without rebound tenderness or guarding or rigidity. Bowel sounds present. No organomegaly or mass. She had left CVA tenderness. EXTREMITIES: No pedal edema, cyanosis, or clubbing.  NEUROLOGIC: Cranial nerves II through XII are intact. Muscle strength 5/5 in all extremities. Sensation intact. Gait not checked. Slow speech.  PSYCHIATRIC: The patient is alert and oriented x 2.  Normal affect and good eye contact. SKIN: No obvious rash, lesion, or ulcer LABORATORY PANEL:  Female CBC Recent Labs  Lab 08/26/19 0331  WBC 5.7  HGB 11.1*  HCT 33.1*  PLT 115*   ------------------------------------------------------------------------------------------------------------------ Chemistries  Recent Labs  Lab 08/24/19 1508  08/26/19 0331  NA 141   < > 141  K 3.1*   < > 4.0  CL 101   < > 109  CO2 24   < > 23  GLUCOSE 100*   < > 134*  BUN 6   < > <5*  CREATININE 0.42*   < > 0.48  CALCIUM 7.8*   < > 8.6*  MG 1.4*   < > 1.9  AST 57*  --   --   ALT 29  --   --   ALKPHOS 52  --   --   BILITOT 1.0  --   --    < > = values in this interval not displayed.   RADIOLOGY:  No results found. ASSESSMENT AND PLAN:   1. Acute Colitis.  Patient currently on IV Rocephin and Flagyl, IV hydration , pain management and stool studies.  2. UTI with left pyelonephritis with complaint of flank pain. IV Rocephin and follow urine culture.  3. Hypertensive urgency. IV Labetalol prn and resume lopressor. Blood pressure fairly  controlled this morning  4. Alcohol intoxication on admission and now in withdrawal CIWA monitoring, prn Ativan and banana bag daily, scheduled Librium. Precedex as needed Being managed in the ICU  5. Seizure, likely alcohol withdrawal. Resume Keppra.  DVT Prophylaxis. SQ Lovenox.   All the records are reviewed and case discussed with Care Management/Social Worker. Management plans discussed with the patient, and she is in agreement.   CODE STATUS: Full Code  TOTAL TIME TAKING CARE OF THIS PATIENT: 32 minutes.   More than 50% of the time was spent in counseling/coordination of care: YES  POSSIBLE D/C IN 2 DAYS, DEPENDING ON CLINICAL CONDITION.   Armany Mano M.D on 08/26/2019 at 2:52 PM  Between 7am to 6pm - Pager - [phone]  After 6pm go to [email or web address] - Proofreader  [facility]  Office  [phone]  CC: Primary care physician; Patient, No Pcp Per  Note: This dictation was prepared with Dragon dictation along with smaller phrase technology. Any transcriptional errors that result from this process are unintentional.
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
## 2019-08-26 NOTE — Progress Notes (Signed)
Writer @ bedside for PIV placement per consult. Previous attempt by VAST RN unsuccessful. Patient breakfast @ bedside. Informed [employer] fast streamer can return later for PIV placement. RN agreeable to plan.

## 2019-08-27 DIAGNOSIS — F10231 Alcohol dependence with withdrawal delirium: Secondary | ICD-10-CM

## 2019-08-27 LAB — BASIC METABOLIC PANEL
Anion gap: 6 (ref 5–15)
BUN: 6 mg/dL (ref 6–20)
CO2: 23 mmol/L (ref 22–32)
Calcium: 8.2 mg/dL — ABNORMAL LOW (ref 8.9–10.3)
Chloride: 110 mmol/L (ref 98–111)
Creatinine, Ser: 0.54 mg/dL (ref 0.44–1.00)
GFR calc Af Amer: >60 mL/min (ref >60)
GFR calc non Af Amer: >60 mL/min (ref >60)
Glucose, Bld: 127 mg/dL — ABNORMAL HIGH (ref 70–99)
Potassium: 3.4 mmol/L — ABNORMAL LOW (ref 3.5–5.1)
Sodium: 139 mmol/L (ref 135–145)

## 2019-08-27 LAB — CBC
HCT: 29.7 % — ABNORMAL LOW (ref 36.0–46.0)
Hemoglobin: 9.9 g/dL — ABNORMAL LOW (ref 12.0–15.0)
MCH: 30.4 pg (ref 26.0–34.0)
MCHC: 33.3 g/dL (ref 30.0–36.0)
MCV: 91.1 fL (ref 80.0–100.0)
Platelets: 88 10*3/uL — ABNORMAL LOW (ref 150–400)
RBC: 3.26 MIL/uL — ABNORMAL LOW (ref 3.87–5.11)
RDW: 14.6 % (ref 11.5–15.5)
WBC: 5.5 10*3/uL (ref 4.0–10.5)
nRBC: 0 % (ref 0.0–0.2)

## 2019-08-27 LAB — MRSA PCR SCREENING: MRSA by PCR: NEGATIVE

## 2019-08-27 LAB — MAGNESIUM: Magnesium: 1.7 mg/dL (ref 1.7–2.4)

## 2019-08-27 MED ORDER — HALOPERIDOL LACTATE 5 MG/ML IJ SOLN
5.0000 mg | Freq: Once | INTRAMUSCULAR | Status: AC
  Administered 2019-08-27: 5 mg via INTRAVENOUS
  Filled 2019-08-27: qty 1

## 2019-08-27 MED ORDER — HALOPERIDOL LACTATE 5 MG/ML IJ SOLN
2.5000 mg | Freq: Four times a day (QID) | INTRAMUSCULAR | Status: DC | PRN
  Administered 2019-08-27 – 2019-08-28 (×2): 5 mg via INTRAVENOUS
  Filled 2019-08-27 (×2): qty 1

## 2019-08-27 MED ORDER — MAGNESIUM SULFATE 2 GM/50ML IV SOLN
2.0000 g | Freq: Once | INTRAVENOUS | Status: AC
  Administered 2019-08-27: 2 g via INTRAVENOUS
  Filled 2019-08-27: qty 50

## 2019-08-27 MED ORDER — POTASSIUM CHLORIDE 20 MEQ PO PACK
40.0000 meq | PACK | Freq: Once | ORAL | Status: AC
  Administered 2019-08-27: 17:00:00 40 meq via ORAL
  Filled 2019-08-27: qty 2

## 2019-08-27 MED ORDER — DIPHENHYDRAMINE HCL 50 MG/ML IJ SOLN
25.0000 mg | Freq: Once | INTRAMUSCULAR | Status: AC
  Administered 2019-08-27: 25 mg via INTRAVENOUS

## 2019-08-27 MED ORDER — DIPHENHYDRAMINE HCL 50 MG/ML IJ SOLN
INTRAMUSCULAR | Status: AC
  Administered 2019-08-27: 25 mg
  Filled 2019-08-27: qty 1

## 2019-08-27 MED ORDER — DIPHENHYDRAMINE HCL 50 MG/ML IJ SOLN
INTRAMUSCULAR | Status: AC
  Filled 2019-08-27: qty 1

## 2019-08-27 NOTE — Progress Notes (Signed)
Pt is very agitated, confused, and having hallucinations.She is wanting to leave hospital.  She reports "she is depressed, but doesn't want to hurt herself". Appears to continue to be experiencing ETOH withdrawal.  She is currently not able to competently make decisions, therefore with place IVC orders as she is wanting to leave.  She is on Precedex infusion, and has received 4 mg ativan, 4 mg morphine, and 5 mg haldol by nursing, and remains agitated.  Will give additional 5 mg haldol and 25 mg Benadryl.     Darel Hong, AGACNP-BC [facility] Pager: [phone]

## 2019-08-27 NOTE — Progress Notes (Signed)
CC  Follow up DT's    HPI On precedex +DT's         VITALS: BP 117/80   Pulse 77   Temp (!) 97.5 F (36.4 C) (Oral)   Resp (!) 23   Ht 5\' 4"  (1.626 m)   Wt 63.5 kg   LMP 07/25/2019 (Approximate)   SpO2 99%   BMI 24.03 kg/m     I/O last 3 completed shifts: In: 1302.6 [P.O.:240; I.V.:455.3; IV Piggyback:607.3] Out: 1375 [Urine:1375] No intake/output data recorded.  SpO2: 99 %   Review of Systems:  Gen:  Denies  fever, sweats, chills weight loss  HEENT: Denies blurred vision, double vision, ear pain, eye pain, hearing loss, nose bleeds, sore throat Cardiac:  No dizziness, chest pain or heaviness, chest tightness,edema, No JVD Resp:   No cough, -sputum production, -shortness of breath,-wheezing, -hemoptysis,  Gi: Denies swallowing difficulty, stomach pain, nausea or vomiting, diarrhea, constipation, bowel incontinence Psych:   + hallucinations  Other:  All other systems negative  Physical Examination:   GENERAL:NAD, no fevers, chills, no weakness no fatigue HEAD: Normocephalic, atraumatic.  EYES: PERLA, EOMI No scleral icterus.  NECK: Supple. No thyromegaly.  No JVD.  PULMONARY: CTA B/L no wheezing, rhonchi, crackles CARDIOVASCULAR: S1 and S2. Regular rate and rhythm. No murmurs GASTROINTESTINAL: Soft, nontender, nondistended. Positive bowel sounds.  MUSCULOSKELETAL: No swelling, clubbing, or edema.  NEUROLOGIC: No gross focal neurological deficits. 5/5 strength all extremities SKIN: No ulceration, lesions, rashes, or cyanosis.  PSYCHIATRIC: +anxiety ALL OTHER ROS ARE NEGATIVE         I personally reviewed Labs under Results section.   MEDICATIONS: I have reviewed all medications and confirmed regimen as documented  CBC    Component Value Date/Time   WBC 5.5 08/27/2019 0429   RBC 3.26 (L) 08/27/2019 0429   HGB 9.9 (L) 08/27/2019 0429   HGB 11.7 (L) 07/16/2014 0845   HCT 29.7 (L) 08/27/2019 0429   HCT 35.8 07/16/2014 0845   PLT 88 (L)  08/27/2019 0429   PLT 387 07/16/2014 0845   MCV 91.1 08/27/2019 0429   MCV 93 07/16/2014 0845   MCH 30.4 08/27/2019 0429   MCHC 33.3 08/27/2019 0429   RDW 14.6 08/27/2019 0429   RDW 14.4 07/16/2014 0845   LYMPHSABS 2.5 08/24/2019 0047   LYMPHSABS 1.2 07/16/2014 0845   MONOABS 0.3 08/24/2019 0047   MONOABS 0.2 07/16/2014 0845   EOSABS 0.0 08/24/2019 0047   EOSABS 0.0 07/16/2014 0845   BASOSABS 0.1 08/24/2019 0047   BASOSABS 0.1 07/16/2014 0845   BMP Latest Ref Rng & Units 08/27/2019 08/26/2019 08/25/2019  Glucose 70 - 99 mg/dL 127(H) 134(H) 96  BUN 6 - 20 mg/dL 6 <5(L) <5(L)  Creatinine 0.44 - 1.00 mg/dL 0.54 0.48 0.48  Sodium 135 - 145 mmol/L 139 141 138  Potassium 3.5 - 5.1 mmol/L 3.4(L) 4.0 4.0  Chloride 98 - 111 mmol/L 110 109 106  CO2 22 - 32 mmol/L 23 23 24   Calcium 8.9 - 10.3 mg/dL 8.2(L) 8.6(L) 8.0(L)     CULTURE RESULTS   Recent Results (from the past 240 hour(s))  SARS CORONAVIRUS 2 (TAT 6-24 HRS) Nasopharyngeal Nasopharyngeal Swab     Status: None   Collection Time: 08/24/19  6:04 AM   Specimen: Nasopharyngeal Swab  Result Value Ref Range Status   SARS Coronavirus 2 NEGATIVE NEGATIVE Final    Comment: (NOTE) SARS-CoV-2 target nucleic acids are NOT DETECTED. The SARS-CoV-2 RNA is generally detectable in upper and lower  respiratory specimens during the acute phase of infection. Negative results do not preclude SARS-CoV-2 infection, do not rule out co-infections with other pathogens, and should not be used as the sole basis for treatment or other patient management decisions. Negative results must be combined with clinical observations, patient history, and epidemiological information. The expected result is Negative. Fact Sheet for Patients: SugarRoll.be Fact Sheet for Healthcare Providers: https://www.woods-mathews.com/ This test is not yet approved or cleared by the [place] FDA and  has been authorized for  detection and/or diagnosis of SARS-CoV-2 by FDA under an Emergency Use Authorization (EUA). This EUA will remain  in effect (meaning this test can be used) for the duration of the COVID-19 declaration under Section 56 4(b)(1) of the Act, 21 U.S.C. section 360bbb-3(b)(1), unless the authorization is terminated or revoked sooner. Performed at [facility], [place] [address], [place], [place] [address]   SARS Coronavirus 2 by RT PCR (hospital order, performed in [facility] hospital lab) Nasopharyngeal Nasopharyngeal Swab     Status: None   Collection Time: 08/24/19 10:44 AM   Specimen: Nasopharyngeal Swab  Result Value Ref Range Status   SARS Coronavirus 2 NEGATIVE NEGATIVE Final    Comment: (NOTE) If result is NEGATIVE SARS-CoV-2 target nucleic acids are NOT DETECTED. The SARS-CoV-2 RNA is generally detectable in upper and lower  respiratory specimens during the acute phase of infection. The lowest  concentration of SARS-CoV-2 viral copies this assay can detect is 250  copies / mL. A negative result does not preclude SARS-CoV-2 infection  and should not be used as the sole basis for treatment or other  patient management decisions.  A negative result may occur with  improper specimen collection / handling, submission of specimen other  than nasopharyngeal swab, presence of viral mutation(s) within the  areas targeted by this assay, and inadequate number of viral copies  (<250 copies / mL). A negative result must be combined with clinical  observations, patient history, and epidemiological information. If result is POSITIVE SARS-CoV-2 target nucleic acids are DETECTED. The SARS-CoV-2 RNA is generally detectable in upper and lower  respiratory specimens dur ing the acute phase of infection.  Positive  results are indicative of active infection with SARS-CoV-2.  Clinical  correlation with patient history and other diagnostic information is  necessary to determine patient infection  status.  Positive results do  not rule out bacterial infection or co-infection with other viruses. If result is PRESUMPTIVE POSTIVE SARS-CoV-2 nucleic acids MAY BE PRESENT.   A presumptive positive result was obtained on the submitted specimen  and confirmed on repeat testing.  While 2019 novel coronavirus  (SARS-CoV-2) nucleic acids may be present in the submitted sample  additional confirmatory testing may be necessary for epidemiological  and / or clinical management purposes  to differentiate between  SARS-CoV-2 and other Sarbecovirus currently known to infect humans.  If clinically indicated additional testing with an alternate test  methodology [phone]) is advised. The SARS-CoV-2 RNA is generally  detectable in upper and lower respiratory sp ecimens during the acute  phase of infection. The expected result is Negative. Fact Sheet for Patients:  StrictlyIdeas.no Fact Sheet for Healthcare Providers: BankingDealers.co.za This test is not yet approved or cleared by the [place] FDA and has been authorized for detection and/or diagnosis of SARS-CoV-2 by FDA under an Emergency Use Authorization (EUA).  This EUA will remain in effect (meaning this test can be used) for the duration of the COVID-19 declaration under Section 564(b)(1)  of the Act, 21 U.S.C. section 360bbb-3(b)(1), unless the authorization is terminated or revoked sooner. Performed at [facility], [address] [place], [place] [address]   CULTURE, BLOOD (ROUTINE X 2) w Reflex to ID Panel     Status: None (Preliminary result)   Collection Time: 08/24/19  5:21 PM   Specimen: BLOOD  Result Value Ref Range Status   Specimen Description BLOOD BLOOD LEFT WRIST  Final   Special Requests   Final    BOTTLES DRAWN AEROBIC AND ANAEROBIC Blood Culture adequate volume   Culture   Final    NO GROWTH 3 DAYS Performed at [facility], [address]  [place], [place] [address]    Report Status PENDING  Incomplete  CULTURE, BLOOD (ROUTINE X 2) w Reflex to ID Panel     Status: None (Preliminary result)   Collection Time: 08/24/19  6:22 PM   Specimen: BLOOD  Result Value Ref Range Status   Specimen Description BLOOD RIGHT MIDLINE  Final   Special Requests   Final    BOTTLES DRAWN AEROBIC AND ANAEROBIC Blood Culture adequate volume   Culture   Final    NO GROWTH 3 DAYS Performed at [facility], [address] [place], [place] [address]    Report Status PENDING  Incomplete              ASSESSMENT AND PLAN SYNOPSIS     UTI with pyelonephritis with infectious Colitis in setting of ETOH withdrawal continue IV abx  SEVERE ALCOHOL WITHDRAWAL -Therapy with Thiamine and MVI -CIWA Protocol -Precedex as needed   Out of bed to chair as tolerated  SD status   Maretta Bees Patricia Pesa, M.D.  [facility]  Medical Director [place] Director [facility] Cardio-Pulmonary Department

## 2019-08-27 NOTE — Progress Notes (Signed)
[facility] [place] NAME: Sarah Serrano    MR#:  [id number]  DATE OF BIRTH:  1976/02/07  SUBJECTIVE:  CHIEF COMPLAINT:   Chief Complaint  Patient presents with  . Detox   Patient pleasantly confused due to alcohol withdrawal.  On Precedex.  REVIEW OF SYSTEMS:  Review of Systems  Constitutional: Negative for chills and fever.  HENT: Negative for hearing loss and tinnitus.   Eyes: Negative for blurred vision and double vision.  Respiratory: Negative for cough and shortness of breath.   Cardiovascular: Negative for chest pain and palpitations.  Gastrointestinal: Negative for heartburn, nausea and vomiting.  Genitourinary: Negative for dysuria and urgency.  Musculoskeletal: Positive for back pain. Negative for myalgias and neck pain.  Skin: Negative for itching and rash.  Neurological: Negative for dizziness and headaches.  Psychiatric/Behavioral: Negative for depression and hallucinations.    DRUG ALLERGIES:  No Known Allergies VITALS:  Blood pressure 104/64, pulse 86, temperature 98.9 F (37.2 C), temperature source Axillary, resp. rate (!) 23, height 5\' 4"  (1.626 m), weight 63.5 kg, last menstrual period 07/25/2019, SpO2 100 %. PHYSICAL EXAMINATION:  Physical Exam   GENERAL:  43 y.o.-year-old AA female patient lying in the bed with no acute distress.  EYES: Pupils equal, round, reactive to light and accommodation. No scleral icterus. Extraocular muscles intact.  HEENT: Head atraumatic, normocephalic. Oropharynx and nasopharynx clear.  NECK:  Supple, no jugular venous distention. No thyroid enlargement, no tenderness.  LUNGS: Normal breath sounds bilaterally, no wheezing, rales,rhonchi or crepitation. No use of accessory muscles of respiration.  CARDIOVASCULAR: Regular rate and rhythm, S1, S2 normal. No murmurs, rubs, or gallops.  ABDOMEN: Soft, with suprapubic tenderness without rebound tenderness or guarding or rigidity. Bowel  sounds present. No organomegaly or mass. She had left CVA tenderness. EXTREMITIES: No pedal edema, cyanosis, or clubbing.  NEUROLOGIC: Cranial nerves II through XII are intact. Muscle strength 5/5 in all extremities. Sensation intact. Gait not checked. Slow speech.  PSYCHIATRIC: The patient is alert and oriented x 2.  Normal affect and good eye contact. SKIN: No obvious rash, lesion, or ulcer  LABORATORY PANEL:  Female CBC Recent Labs  Lab 08/27/19 0429  WBC 5.5  HGB 9.9*  HCT 29.7*  PLT 88*   ------------------------------------------------------------------------------------------------------------------ Chemistries  Recent Labs  Lab 08/24/19 1508  08/27/19 0429  NA 141   < > 139  K 3.1*   < > 3.4*  CL 101   < > 110  CO2 24   < > 23  GLUCOSE 100*   < > 127*  BUN 6   < > 6  CREATININE 0.42*   < > 0.54  CALCIUM 7.8*   < > 8.2*  MG 1.4*   < > 1.7  AST 57*  --   --   ALT 29  --   --   ALKPHOS 52  --   --   BILITOT 1.0  --   --    < > = values in this interval not displayed.   RADIOLOGY:  No results found. ASSESSMENT AND PLAN:   1. Acute Colitis.  Patient currently on IV Rocephin and Flagyl, IV hydration , pain management and stool studies.  2. UTI with left pyelonephritis with complaint of flank pain. IV Rocephin and follow urine culture.  3. Hypertensive urgency. IV Labetalol prn and resume lopressor. Blood pressure fairly controlled this morning  4. Alcohol intoxication on admission and now in withdrawal CIWA  monitoring, prn Ativan and banana bag daily, scheduled Librium. Precedex as needed Being managed in the ICU  5. Seizure, likely alcohol withdrawal. Resume Keppra.  6.  Thrombocytopenia Noted drop in platelet count to 88.  No bleeding. Possibly related to patient's alcohol abuse. DVT prophylaxis with Lovenox discontinued.  Intensivist monitoring.  DVT Prophylaxis.  Lovenox discontinued due to thrombocytopenia SCDs ordered  All the records are  reviewed and case discussed with Care Management/Social Worker. Management plans discussed with the patient, and she is in agreement.  CODE STATUS: Full Code  TOTAL TIME TAKING CARE OF THIS PATIENT: 34 minutes.   More than 50% of the time was spent in counseling/coordination of care: YES  POSSIBLE D/C IN 2 DAYS, DEPENDING ON CLINICAL CONDITION.   Rahshawn Remo M.D on 08/27/2019 at 1:26 PM  Between 7am to 6pm - Pager - [phone]  After 6pm go to [email or web address] - Proofreader  [facility]  Office  [phone]  CC: Primary care physician; Patient, No Pcp Per  Note: This dictation was prepared with Dragon dictation along with smaller phrase technology. Any transcriptional errors that result from this process are unintentional.

## 2019-08-27 NOTE — Progress Notes (Signed)
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 female being evaluated for HIT. LMWH was started 10/26 for DVT prophylaxis, and baseline platelets were 337.   HIT labs were ordered on 10/28 when platelets dropped to 88.  Auto-populate labs: No results found for: HEPINDPLTAB, SRALOWDOSEHP, SRAHIGHDOSEH   CALCULATE SCORE:  4Ts (see the HIT Algorithm) Score  Thrombocytopenia 2  Timing 0  Thrombosis 0  Other causes of thrombocytopenia 1  Total 3     Recommendations (A or B) are based on available lab results (HIT antibody and/or SRA) and the HIT algorithm    A. HIT antibody result available:  Pending   B. SRA result availability  SRA not available   Name of MD Contacted: Dr Mortimer Fries  Plan (Discussed with provider) Labs ordered:  SRA not needed  Heparin allergy:  Heparin allergy documented or updated. Anticoagulation plans:  Discontinue heparin / [place]  Pharmacy will continue to follow for results and update plans if needed   [name], PharmD 08/27/2019, 3:21 PM

## 2019-08-27 NOTE — Progress Notes (Signed)
Pharmacy Electrolyte Monitoring Consult:  Pharmacy consulted to assist in monitoring and replacing electrolytes in this 43 y.o. female admitted on 08/24/2019 admitted to ICU diaphoretic and with tremors. Patient is being treated for possible withdrawal. Patient with history significant for alcoholism, pancreatitis, colitis, heroin abuse, anemia, and PTSD.   Labs:  Sodium (mmol/L)  Date Value  08/27/2019 139  07/16/2014 145   Potassium (mmol/L)  Date Value  08/27/2019 3.4 (L)  07/16/2014 3.6   Magnesium (mg/dL)  Date Value  08/27/2019 1.7  11/22/2013 1.5 (L)   Phosphorus (mg/dL)  Date Value  08/26/2019 3.6  02/03/2013 1.7 (L)   Calcium (mg/dL)  Date Value  08/27/2019 8.2 (L)   Calcium, Total (mg/dL)  Date Value  07/16/2014 8.9   Albumin (g/dL)  Date Value  08/26/2019 3.0 (L)  07/16/2014 3.4   Corrected calcium : 9.00 mg/dL  Assessment/Plan:   continue to replace for goal potassium ~ 4, goal magnesium ~ 2, and goal phosphorus > 2.5  Replace potassium with 40 mEq oral KCl x 1  Replace magnesium with 2 grams IV magnesium sulfate  Repeat electrolytes with am labs.   Pharmacy will continue to monitor and adjust per consult.   [name], PharmD 08/27/2019 7:10 AM

## 2019-08-28 DIAGNOSIS — F1010 Alcohol abuse, uncomplicated: Secondary | ICD-10-CM

## 2019-08-28 DIAGNOSIS — F203 Undifferentiated schizophrenia: Secondary | ICD-10-CM

## 2019-08-28 LAB — RENAL FUNCTION PANEL
Albumin: 2.8 g/dL — ABNORMAL LOW (ref 3.5–5.0)
Anion gap: 6 (ref 5–15)
BUN: 5 mg/dL — ABNORMAL LOW (ref 6–20)
CO2: 21 mmol/L — ABNORMAL LOW (ref 22–32)
Calcium: 8.4 mg/dL — ABNORMAL LOW (ref 8.9–10.3)
Chloride: 111 mmol/L (ref 98–111)
Creatinine, Ser: 0.53 mg/dL (ref 0.44–1.00)
GFR calc Af Amer: >60 mL/min (ref >60)
GFR calc non Af Amer: >60 mL/min (ref >60)
Glucose, Bld: 120 mg/dL — ABNORMAL HIGH (ref 70–99)
Phosphorus: 4.9 mg/dL — ABNORMAL HIGH (ref 2.5–4.6)
Potassium: 3.7 mmol/L (ref 3.5–5.1)
Sodium: 138 mmol/L (ref 135–145)

## 2019-08-28 LAB — CBC
HCT: 29.8 % — ABNORMAL LOW (ref 36.0–46.0)
Hemoglobin: 9.7 g/dL — ABNORMAL LOW (ref 12.0–15.0)
MCH: 30.5 pg (ref 26.0–34.0)
MCHC: 32.6 g/dL (ref 30.0–36.0)
MCV: 93.7 fL (ref 80.0–100.0)
Platelets: 83 10*3/uL — ABNORMAL LOW (ref 150–400)
RBC: 3.18 MIL/uL — ABNORMAL LOW (ref 3.87–5.11)
RDW: 14.7 % (ref 11.5–15.5)
WBC: 6.5 10*3/uL (ref 4.0–10.5)
nRBC: 0 % (ref 0.0–0.2)

## 2019-08-28 LAB — MAGNESIUM: Magnesium: 1.9 mg/dL (ref 1.7–2.4)

## 2019-08-28 MED ORDER — MAGNESIUM SULFATE IN D5W 1-5 GM/100ML-% IV SOLN
1.0000 g | Freq: Once | INTRAVENOUS | Status: AC
  Administered 2019-08-28: 11:00:00 1 g via INTRAVENOUS
  Filled 2019-08-28: qty 100

## 2019-08-28 MED ORDER — OXYCODONE-ACETAMINOPHEN 5-325 MG PO TABS
1.0000 | ORAL_TABLET | Freq: Once | ORAL | Status: AC
  Administered 2019-08-28: 1 via ORAL
  Filled 2019-08-28: qty 1

## 2019-08-28 MED ORDER — METRONIDAZOLE 500 MG PO TABS
500.0000 mg | ORAL_TABLET | Freq: Three times a day (TID) | ORAL | Status: DC
  Administered 2019-08-28: 500 mg via ORAL
  Filled 2019-08-28 (×2): qty 1

## 2019-08-28 MED ORDER — DIPHENHYDRAMINE HCL 50 MG/ML IJ SOLN
25.0000 mg | Freq: Once | INTRAMUSCULAR | Status: AC
  Administered 2019-08-28: 25 mg via INTRAVENOUS
  Filled 2019-08-28: qty 1

## 2019-08-28 MED ORDER — POTASSIUM CHLORIDE 20 MEQ PO PACK
40.0000 meq | PACK | Freq: Once | ORAL | Status: AC
  Administered 2019-08-28: 40 meq via ORAL
  Filled 2019-08-28: qty 2

## 2019-08-28 NOTE — Progress Notes (Signed)
CC  Severe Dt's    HPI On precedex High risk for aspiration Metabolic encephalopathy High risk for intubation         VITALS: BP (!) 137/92   Pulse 80   Temp 98.8 F (37.1 C) (Oral)   Resp (!) 28   Ht 5\' 4"  (1.626 m)   Wt 63.5 kg   LMP 07/25/2019 (Approximate)   SpO2 100%   BMI 24.03 kg/m     I/O last 3 completed shifts: In: 776.2 [I.V.:371.8; IV Piggyback:404.4] Out: 1101 [Urine:1100; Stool:1] No intake/output data recorded.  SpO2: 100 %  REVIEW OF SYSTEMS  PATIENT IS UNABLE TO PROVIDE COMPLETE REVIEW OF SYSTEM S DUE TO SEVERE CRITICAL ILLNESS AND ENCEPHALOPATHY    PHYSICAL EXAMINATION:  GENERAL:critically ill appearing HEAD: Normocephalic, atraumatic.  EYES: Pupils equal, round, reactive to light.  No scleral icterus.  MOUTH: Moist mucosal membrane. NECK: Supple. No thyromegaly. No nodules. No JVD.  PULMONARY: +rhonchi, +wheezing CARDIOVASCULAR: S1 and S2. Regular rate and rhythm. No murmurs, rubs, or gallops.  GASTROINTESTINAL: Soft, nontender, -distended. Positive bowel sounds.  MUSCULOSKELETAL: No swelling, clubbing, or edema.  NEUROLOGIC: obtunded SKIN:intact,warm,dry   CBC    Component Value Date/Time   WBC 6.5 08/28/2019 0236   RBC 3.18 (L) 08/28/2019 0236   HGB 9.7 (L) 08/28/2019 0236   HGB 11.7 (L) 07/16/2014 0845   HCT 29.8 (L) 08/28/2019 0236   HCT 35.8 07/16/2014 0845   PLT 83 (L) 08/28/2019 0236   PLT 387 07/16/2014 0845   MCV 93.7 08/28/2019 0236   MCV 93 07/16/2014 0845   MCH 30.5 08/28/2019 0236   MCHC 32.6 08/28/2019 0236   RDW 14.7 08/28/2019 0236   RDW 14.4 07/16/2014 0845   LYMPHSABS 2.5 08/24/2019 0047   LYMPHSABS 1.2 07/16/2014 0845   MONOABS 0.3 08/24/2019 0047   MONOABS 0.2 07/16/2014 0845   EOSABS 0.0 08/24/2019 0047   EOSABS 0.0 07/16/2014 0845   BASOSABS 0.1 08/24/2019 0047   BASOSABS 0.1 07/16/2014 0845   BMP Latest Ref Rng & Units 08/28/2019 08/27/2019 08/26/2019  Glucose 70 - 99 mg/dL 120(H) 127(H)  134(H)  BUN 6 - 20 mg/dL 5(L) 6 <5(L)  Creatinine 0.44 - 1.00 mg/dL 0.53 0.54 0.48  Sodium 135 - 145 mmol/L 138 139 141  Potassium 3.5 - 5.1 mmol/L 3.7 3.4(L) 4.0  Chloride 98 - 111 mmol/L 111 110 109  CO2 22 - 32 mmol/L 21(L) 23 23  Calcium 8.9 - 10.3 mg/dL 8.4(L) 8.2(L) 8.6(L)     CULTURE RESULTS   Recent Results (from the past 240 hour(s))  SARS CORONAVIRUS 2 (TAT 6-24 HRS) Nasopharyngeal Nasopharyngeal Swab     Status: None   Collection Time: 08/24/19  6:04 AM   Specimen: Nasopharyngeal Swab  Result Value Ref Range Status   SARS Coronavirus 2 NEGATIVE NEGATIVE Final    Comment: (NOTE) SARS-CoV-2 target nucleic acids are NOT DETECTED. The SARS-CoV-2 RNA is generally detectable in upper and lower respiratory specimens during the acute phase of infection. Negative results do not preclude SARS-CoV-2 infection, do not rule out co-infections with other pathogens, and should not be used as the sole basis for treatment or other patient management decisions. Negative results must be combined with clinical observations, patient history, and epidemiological information. The expected result is Negative. Fact Sheet for Patients: SugarRoll.be Fact Sheet for Healthcare Providers: https://www.woods-mathews.com/ This test is not yet approved or cleared by the [place] FDA and  has been authorized for detection and/or diagnosis of SARS-CoV-2 by FDA  under an Emergency Use Authorization (EUA). This EUA will remain  in effect (meaning this test can be used) for the duration of the COVID-19 declaration under Section 56 4(b)(1) of the Act, 21 U.S.C. section 360bbb-3(b)(1), unless the authorization is terminated or revoked sooner. Performed at [facility], [place] [address], [place], [place] [address]   SARS Coronavirus 2 by RT PCR (hospital order, performed in [facility] hospital lab) Nasopharyngeal Nasopharyngeal Swab     Status: None    Collection Time: 08/24/19 10:44 AM   Specimen: Nasopharyngeal Swab  Result Value Ref Range Status   SARS Coronavirus 2 NEGATIVE NEGATIVE Final    Comment: (NOTE) If result is NEGATIVE SARS-CoV-2 target nucleic acids are NOT DETECTED. The SARS-CoV-2 RNA is generally detectable in upper and lower  respiratory specimens during the acute phase of infection. The lowest  concentration of SARS-CoV-2 viral copies this assay can detect is 250  copies / mL. A negative result does not preclude SARS-CoV-2 infection  and should not be used as the sole basis for treatment or other  patient management decisions.  A negative result may occur with  improper specimen collection / handling, submission of specimen other  than nasopharyngeal swab, presence of viral mutation(s) within the  areas targeted by this assay, and inadequate number of viral copies  (<250 copies / mL). A negative result must be combined with clinical  observations, patient history, and epidemiological information. If result is POSITIVE SARS-CoV-2 target nucleic acids are DETECTED. The SARS-CoV-2 RNA is generally detectable in upper and lower  respiratory specimens dur ing the acute phase of infection.  Positive  results are indicative of active infection with SARS-CoV-2.  Clinical  correlation with patient history and other diagnostic information is  necessary to determine patient infection status.  Positive results do  not rule out bacterial infection or co-infection with other viruses. If result is PRESUMPTIVE POSTIVE SARS-CoV-2 nucleic acids MAY BE PRESENT.   A presumptive positive result was obtained on the submitted specimen  and confirmed on repeat testing.  While 2019 novel coronavirus  (SARS-CoV-2) nucleic acids may be present in the submitted sample  additional confirmatory testing may be necessary for epidemiological  and / or clinical management purposes  to differentiate between  SARS-CoV-2 and other Sarbecovirus  currently known to infect humans.  If clinically indicated additional testing with an alternate test  methodology [phone]) is advised. The SARS-CoV-2 RNA is generally  detectable in upper and lower respiratory sp ecimens during the acute  phase of infection. The expected result is Negative. Fact Sheet for Patients:  StrictlyIdeas.no Fact Sheet for Healthcare Providers: BankingDealers.co.za This test is not yet approved or cleared by the [place] FDA and has been authorized for detection and/or diagnosis of SARS-CoV-2 by FDA under an Emergency Use Authorization (EUA).  This EUA will remain in effect (meaning this test can be used) for the duration of the COVID-19 declaration under Section 564(b)(1) of the Act, 21 U.S.C. section 360bbb-3(b)(1), unless the authorization is terminated or revoked sooner. Performed at [facility], [address] [place], [place] [address]   CULTURE, BLOOD (ROUTINE X 2) w Reflex to ID Panel     Status: None (Preliminary result)   Collection Time: 08/24/19  5:21 PM   Specimen: BLOOD  Result Value Ref Range Status   Specimen Description BLOOD BLOOD LEFT WRIST  Final   Special Requests   Final    BOTTLES DRAWN AEROBIC AND ANAEROBIC Blood Culture adequate volume  Culture   Final    NO GROWTH 4 DAYS Performed at [facility], [address] [place], [place] [address]    Report Status PENDING  Incomplete  CULTURE, BLOOD (ROUTINE X 2) w Reflex to ID Panel     Status: None (Preliminary result)   Collection Time: 08/24/19  6:22 PM   Specimen: BLOOD  Result Value Ref Range Status   Specimen Description BLOOD RIGHT MIDLINE  Final   Special Requests   Final    BOTTLES DRAWN AEROBIC AND ANAEROBIC Blood Culture adequate volume   Culture   Final    NO GROWTH 4 DAYS Performed at [facility], [address], [place], [place] [address]    Report Status PENDING  Incomplete  MRSA  PCR Screening     Status: None   Collection Time: 08/27/19  9:24 AM   Specimen: Nasopharyngeal  Result Value Ref Range Status   MRSA by PCR NEGATIVE NEGATIVE Final    Comment:        The GeneXpert MRSA Assay (FDA approved for NASAL specimens only), is one component of a comprehensive MRSA colonization surveillance program. It is not intended to diagnose MRSA infection nor to guide or monitor treatment for MRSA infections. Performed at [facility], [address] [place], [place] [address]               ASSESSMENT AND PLAN SYNOPSIS  SEVERE ALCOHOL WITHDRAWAL -Therapy with Thiamine and MVI -CIWA Protocol -Precedex as needed -High risk for intubation  -high risk for aspiration   ELECTROLYTES -follow labs as needed -replace as needed -pharmacy consultation and following    DVT/GI PRX ordered TRANSFUSIONS AS NEEDED MONITOR FSBS ASSESS the need for LABS as needed  SEVERE ENCEPHALOPATHY FROM DT's    Critical Care Time devoted to patient care services described in this note is 32 minutes.   Overall, patient is critically ill, prognosis is guarded.   Corrin Parker, M.D.  [facility]  Medical Director [place] Director [facility] Cardio-Pulmonary Department

## 2019-08-28 NOTE — Progress Notes (Signed)
Patient decided to leave Against Medical Advice, signing the [place] form at [place] on 08/28/2019 and dressed herself.  Left upper arm midline was removed and patient left on her own.
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
## 2019-08-28 NOTE — Progress Notes (Signed)
[place] [facility] [place] NAME: Sarah Serrano    MR#:  [id number]  DATE OF BIRTH:  09-Dec-1975  SUBJECTIVE:  CHIEF COMPLAINT:   Chief Complaint  Patient presents with  . Detox   Patient pleasantly confused due to alcohol withdrawal.  On Precedex.  REVIEW OF SYSTEMS:  Review of Systems  Constitutional: Negative for chills and fever.  HENT: Negative for hearing loss and tinnitus.   Eyes: Negative for blurred vision and double vision.  Respiratory: Negative for cough and shortness of breath.   Cardiovascular: Negative for chest pain and palpitations.  Gastrointestinal: Negative for heartburn, nausea and vomiting.  Genitourinary: Negative for dysuria and urgency.  Musculoskeletal: Negative for back pain, myalgias and neck pain.  Skin: Negative for itching and rash.  Neurological: Negative for dizziness and headaches.  Psychiatric/Behavioral: Negative for depression and hallucinations.    DRUG ALLERGIES:   Allergies  Allergen Reactions  . Heparin     HIT Ab pending   VITALS:  Blood pressure 95/68, pulse 86, temperature 98 F (36.7 C), temperature source Oral, resp. rate (!) 28, height 5\' 4"  (1.626 m), weight 63.5 kg, last menstrual period 07/25/2019, SpO2 99 %. PHYSICAL EXAMINATION:  Physical Exam   GENERAL:  43 y.o.-year-old AA female patient lying in the bed with no acute distress.  EYES: Pupils equal, round, reactive to light and accommodation. No scleral icterus. Extraocular muscles intact.  HEENT: Head atraumatic, normocephalic. Oropharynx and nasopharynx clear.  NECK:  Supple, no jugular venous distention. No thyroid enlargement, no tenderness.  LUNGS: Normal breath sounds bilaterally, no wheezing, rales,rhonchi or crepitation. No use of accessory muscles of respiration.  CARDIOVASCULAR: Regular rate and rhythm, S1, S2 normal. No murmurs, rubs, or gallops.  ABDOMEN: Soft, with suprapubic tenderness without rebound tenderness or  guarding or rigidity. Bowel sounds present. No organomegaly or mass. She had left CVA tenderness. EXTREMITIES: No pedal edema, cyanosis, or clubbing.  NEUROLOGIC: Cranial nerves II through XII are intact. Muscle strength 5/5 in all extremities. Sensation intact. Gait not checked. Slow speech.  PSYCHIATRIC: The patient is alert and oriented x 2.  Normal affect and good eye contact. SKIN: No obvious rash, lesion, or ulcer  LABORATORY PANEL:  Female CBC Recent Labs  Lab 08/28/19 0236  WBC 6.5  HGB 9.7*  HCT 29.8*  PLT 83*   ------------------------------------------------------------------------------------------------------------------ Chemistries  Recent Labs  Lab 08/24/19 1508  08/28/19 0236  NA 141   < > 138  K 3.1*   < > 3.7  CL 101   < > 111  CO2 24   < > 21*  GLUCOSE 100*   < > 120*  BUN 6   < > 5*  CREATININE 0.42*   < > 0.53  CALCIUM 7.8*   < > 8.4*  MG 1.4*   < > 1.9  AST 57*  --   --   ALT 29  --   --   ALKPHOS 52  --   --   BILITOT 1.0  --   --    < > = values in this interval not displayed.   RADIOLOGY:  No results found. ASSESSMENT AND PLAN:   1. Acute Colitis.   Patient was treated with IV antibiotics with IV Rocephin which she has completed.  As well as IV Flagyl.  Clinically improved.  IV hydration , pain management and stool studies.  2. UTI with left pyelonephritis with complaint of flank pain.  Patient was treated  with IV antibiotics with Rocephin.   3. Hypertensive urgency. IV Labetalol prn and resume lopressor. Blood pressure better controlled  4. Alcohol intoxication on admission and now in withdrawal CIWA monitoring, prn Ativan and banana bag daily, scheduled Librium. Precedex as needed Being managed in the ICU  5. Seizure, likely alcohol withdrawal. Resume Keppra.  6.  Thrombocytopenia Noted drop in platelet count to 83.  No bleeding. Possibly related to patient's alcohol abuse.  DVT Prophylaxis.  Lovenox discontinued previously  due to thrombocytopenia SCDs ordered  All the records are reviewed and case discussed with Care Management/Social Worker. Management plans discussed with the patient, and she is in agreement.  CODE STATUS: Full Code  TOTAL TIME TAKING CARE OF THIS PATIENT: 32 minutes.   More than 50% of the time was spent in counseling/coordination of care: YES  POSSIBLE D/C IN 2 DAYS, DEPENDING ON CLINICAL CONDITION.   Alric Geise M.D on 08/28/2019 at 3:27 PM  Between 7am to 6pm - Pager - [phone]  After 6pm go to [email or web address] - Proofreader  [facility]  Office  [phone]  CC: Primary care physician; Patient, No Pcp Per  Note: This dictation was prepared with Dragon dictation along with smaller phrase technology. Any transcriptional errors that result from this process are unintentional.

## 2019-08-28 NOTE — Progress Notes (Signed)
Pharmacy Electrolyte Monitoring Consult:  Pharmacy consulted to assist in monitoring and replacing electrolytes in this 43 y.o. female admitted on 08/24/2019 admitted to ICU diaphoretic and with tremors. Patient is being treated for possible withdrawal. Patient with history significant for alcoholism, pancreatitis, colitis, heroin abuse, anemia, and PTSD.   Labs:  Sodium (mmol/L)  Date Value  08/28/2019 138  07/16/2014 145   Potassium (mmol/L)  Date Value  08/28/2019 3.7  07/16/2014 3.6   Magnesium (mg/dL)  Date Value  08/28/2019 1.9  11/22/2013 1.5 (L)   Phosphorus (mg/dL)  Date Value  08/28/2019 4.9 (H)  02/03/2013 1.7 (L)   Calcium (mg/dL)  Date Value  08/28/2019 8.4 (L)   Calcium, Total (mg/dL)  Date Value  07/16/2014 8.9   Albumin (g/dL)  Date Value  08/28/2019 2.8 (L)  07/16/2014 3.4   Corrected calcium : 9.00 mg/dL  Assessment/Plan:   continue to replace for goal potassium ~ 4, goal magnesium ~ 2, and goal phosphorus > 2.5  Replace potassium with 40 mEq oral KCl x 1  Replace magnesium with 1 gram IV magnesium sulfate  Repeat electrolytes with am labs.   Pharmacy will continue to monitor and adjust per consult.   Viveka Wilmeth L, PharmD 08/28/2019 4:45 PM

## 2019-08-28 NOTE — Progress Notes (Signed)
Patient alert, oriented, and cooperative. 10:30 Precedex drip decreased to 0.74mcg.  11:30am precedex drip turned off.  Psych consult placed to have involuntary commitment papers rescinded. Dr. Claris Gower consulted on patient.  Involuntary commitment papers rescinded.

## 2019-08-28 NOTE — Consult Note (Signed)
[facility] Consult   Reason for Consult: Rescind IVC Referring Physician: Jude Ojie Patient Identification: PACHIA SHAKER MRN:  [id number] Principal Diagnosis: <principal problem not specified> Diagnosis:  Active Problems:   Acute colitis   Total Time spent with patient: 45 minutes  Subjective:   DELORAS PEIFFER is a 43 y.o. female patient admitted for alcohol withdrawal.  HPI:  Patient is a 43 year old female with a history of schizophrenia who was admitted to the ICU for alcohol detox.  Last night patient became agitated and was hallucinating.  She was able to calm down with some medications.  Today, nursing staff reports that the patient has not been agitated at all.  They deny that she has been any behavioral concerns.  Psychiatry was consulted to rescind the IVC if deemed fit  Upon evaluation patient was calm and cooperative with Probation officer.  Patient apologizes for her alcohol use.  She states she knows she was wrong by drinking this much but she did so in the context of recent social stressor.  Patient states that she lost her aunt who she considers like her mother and had a hard time coping with the bad news socially began drinking alcohol.  She acknowledges that her drinking was not right and understands the risk of drinking in her condition.  Patient states that she will reach out to her outpatient doctor in order to discuss better ways to cope with stress.  Today patient is unaware of the events of last night.  She reports vaguely remembering wanting to leave the hospital but says that she was anxious about her aunt's funeral.  Patient states that she no longer feels that she needs to leave the hospital and understands that she will need to finish the necessary medical treatment prior to discharge.  The idea of hospitalization was discussed with patient being that she has a history of schizophrenia and with the recent stress and substance use.  Patient denies the  need for the hospital.  She denies any suicidal or homicidal ideation at this time.  She denies any current hallucinations or any prior hallucinations.  Patient states that at some points she can be paranoid but denies feeling paranoid today or recently.  Patient also reports being compliant on her medication as well as compliant with her outpatient appointments.  Past Psychiatric History: Patient has a history of schizophrenia.  Currently under the care of Dr. Olga Millers who she reports seeing last month.  Risk to Self:  No Risk to Others:  No Prior Inpatient Therapy:  Yes Prior Outpatient Therapy:  Yes  Past Medical History:  Past Medical History:  Diagnosis Date  . Alcohol abuse   . Bipolar 1 disorder ([place])   . Pancreatitis   . Substance abuse ([place])   . Thrombocytopenia ([place])     Past Surgical History:  Procedure Laterality Date  . NO PAST SURGERIES     Family History:  Family History  Problem Relation Age of Onset  . Bipolar disorder Mother    Family Psychiatric  History: Denies  social History:  Social History   Substance and Sexual Activity  Alcohol Use Yes     Social History   Substance and Sexual Activity  Drug Use Yes  . Types: Marijuana    Social History   Socioeconomic History  . Marital status: Single    Spouse name: Not on file  . Number of children: Not on file  . Years of education: Not on file  .  Highest education level: Not on file  Occupational History  . Not on file  Social Needs  . Financial resource strain: Not on file  . Food insecurity    Worry: Not on file    Inability: Not on file  . Transportation needs    Medical: Not on file    Non-medical: Not on file  Tobacco Use  . Smoking status: Current Every Day Smoker    Packs/day: 0.50    Types: Cigarettes  . Smokeless tobacco: Never Used  Substance and Sexual Activity  . Alcohol use: Yes  . Drug use: Yes    Types: Marijuana  . Sexual activity: Not on file  Lifestyle  . Physical  activity    Days per week: Not on file    Minutes per session: Not on file  . Stress: Not on file  Relationships  . Social Herbalist on phone: Not on file    Gets together: Not on file    Attends religious service: Not on file    Active member of club or organization: Not on file    Attends meetings of clubs or organizations: Not on file    Relationship status: Not on file  Other Topics Concern  . Not on file  Social History Narrative  . Not on file   Additional Social History: Patient reports she has not been drinking for quite some time but just recently picked it up this 1 time due to the death in her family.  Patient states that she does occasionally smoke marijuana    Allergies:   Allergies  Allergen Reactions  . Heparin     HIT Ab pending    Labs:  Results for orders placed or performed during the hospital encounter of 08/24/19 (from the past 48 hour(s))  Basic metabolic panel     Status: Abnormal   Collection Time: 08/27/19  4:29 AM  Result Value Ref Range   Sodium 139 135 - 145 mmol/L   Potassium 3.4 (L) 3.5 - 5.1 mmol/L   Chloride 110 98 - 111 mmol/L   CO2 23 22 - 32 mmol/L   Glucose, Bld 127 (H) 70 - 99 mg/dL   BUN 6 6 - 20 mg/dL   Creatinine, Ser 0.54 0.44 - 1.00 mg/dL   Calcium 8.2 (L) 8.9 - 10.3 mg/dL   GFR calc non Af Amer >60 >60 mL/min   GFR calc Af Amer >60 >60 mL/min   Anion gap 6 5 - 15    Comment: Performed at [facility], [address] [place], [place] [address]  Magnesium     Status: None   Collection Time: 08/27/19  4:29 AM  Result Value Ref Range   Magnesium 1.7 1.7 - 2.4 mg/dL    Comment: Performed at [facility], [address] [place], [place] [address]  CBC     Status: Abnormal   Collection Time: 08/27/19  4:29 AM  Result Value Ref Range   WBC 5.5 4.0 - 10.5 K/uL   RBC 3.26 (L) 3.87 - 5.11 MIL/uL   Hemoglobin 9.9 (L) 12.0 - 15.0 g/dL   HCT 29.7 (L) 36.0 - 46.0 %   MCV 91.1 80.0 - 100.0 fL   MCH  30.4 26.0 - 34.0 pg   MCHC 33.3 30.0 - 36.0 g/dL   RDW 14.6 11.5 - 15.5 %   Platelets 88 (L) 150 - 400 K/uL    Comment: Immature Platelet Fraction may be clinically indicated,  consider ordering this additional test [id number]    nRBC 0.0 0.0 - 0.2 %    Comment: Performed at [facility], [address] [place], [place] [address]  MRSA PCR Screening     Status: None   Collection Time: 08/27/19  9:24 AM   Specimen: Nasopharyngeal  Result Value Ref Range   MRSA by PCR NEGATIVE NEGATIVE    Comment:        The GeneXpert MRSA Assay (FDA approved for NASAL specimens only), is one component of a comprehensive MRSA colonization surveillance program. It is not intended to diagnose MRSA infection nor to guide or monitor treatment for MRSA infections. Performed at [facility], [address] [place], [place] [address]   Renal function panel     Status: Abnormal   Collection Time: 08/28/19  2:36 AM  Result Value Ref Range   Sodium 138 135 - 145 mmol/L   Potassium 3.7 3.5 - 5.1 mmol/L   Chloride 111 98 - 111 mmol/L   CO2 21 (L) 22 - 32 mmol/L   Glucose, Bld 120 (H) 70 - 99 mg/dL   BUN 5 (L) 6 - 20 mg/dL   Creatinine, Ser 0.53 0.44 - 1.00 mg/dL   Calcium 8.4 (L) 8.9 - 10.3 mg/dL   Phosphorus 4.9 (H) 2.5 - 4.6 mg/dL   Albumin 2.8 (L) 3.5 - 5.0 g/dL   GFR calc non Af Amer >60 >60 mL/min   GFR calc Af Amer >60 >60 mL/min   Anion gap 6 5 - 15    Comment: Performed at [facility], [address] [place], [place] [address]  Magnesium     Status: None   Collection Time: 08/28/19  2:36 AM  Result Value Ref Range   Magnesium 1.9 1.7 - 2.4 mg/dL    Comment: Performed at [facility], [address] [place], [place] [address]  CBC     Status: Abnormal   Collection Time: 08/28/19  2:36 AM  Result Value Ref Range   WBC 6.5 4.0 - 10.5 K/uL   RBC 3.18 (L) 3.87 - 5.11 MIL/uL   Hemoglobin 9.7 (L) 12.0 - 15.0 g/dL   HCT 29.8 (L) 36.0 - 46.0 %   MCV  93.7 80.0 - 100.0 fL   MCH 30.5 26.0 - 34.0 pg   MCHC 32.6 30.0 - 36.0 g/dL   RDW 14.7 11.5 - 15.5 %   Platelets 83 (L) 150 - 400 K/uL    Comment: Immature Platelet Fraction may be clinically indicated, consider ordering this additional test [id number]    nRBC 0.0 0.0 - 0.2 %    Comment: Performed at [facility], [address] [place], [place] [address]    Current Facility-Administered Medications  Medication Dose Route Frequency Provider Last Rate Last Dose  . acetaminophen (TYLENOL) tablet 650 mg  650 mg Oral Q6H PRN Mansy, Jan A, MD      . Chlorhexidine Gluconate Cloth 2 % PADS 6 each  6 each Topical Daily Ottie Glazier, MD   6 each at 08/28/19 1026  . cloNIDine (CATAPRES) tablet 0.1 mg  0.1 mg Oral BID Ojie, Jude, MD   0.1 mg at 08/28/19 1026  . cloNIDine (CATAPRES) tablet 0.2 mg  0.2 mg Oral TID PRN Ottie Glazier, MD   0.2 mg at 08/24/19 1648  . diazepam (VALIUM) tablet 5 mg  5 mg Oral Q6H Flora Lipps, MD   5 mg at 08/28/19 1403  . dicyclomine (BENTYL) tablet 20  mg  20 mg Oral TID PRN Mansy, Jan A, MD      . feeding supplement (ENSURE ENLIVE) (ENSURE ENLIVE) liquid 237 mL  237 mL Oral BID BM Kasa, Kurian, MD   237 mL at [phone] [phone]  . folic acid (FOLVITE) tablet 1 mg  1 mg Oral Daily Mansy, Jan A, MD   1 mg at 08/28/19 1026  . gabapentin (NEURONTIN) capsule 300 mg  300 mg Oral BID PRN Mansy, Jan A, MD      . haloperidol lactate (HALDOL) injection 2.5-5 mg  2.5-5 mg Intravenous Q6H PRN Darel Hong D, NP   5 mg at 08/28/19 0314  . hydrALAZINE (APRESOLINE) injection 10-20 mg  10-20 mg Intravenous Q6H PRN Darel Hong D, NP   20 mg at 08/27/19 0008  . labetalol (NORMODYNE) injection 20 mg  20 mg Intravenous Q3H PRN Mansy, Jan A, MD   20 mg at 08/26/19 0034  . levETIRAcetam (KEPPRA) tablet 500 mg  500 mg Oral BID Mansy, Jan A, MD   500 mg at 08/28/19 1026  . LORazepam (ATIVAN) tablet 1-4 mg  1-4 mg Oral Q1H PRN Awilda Bill, NP       Or  . LORazepam  (ATIVAN) injection 1-4 mg  1-4 mg Intravenous Q1H PRN Awilda Bill, NP   4 mg at 08/28/19 0232  . metoprolol tartrate (LOPRESSOR) tablet 25 mg  25 mg Oral BID Stark Jock, Jude, MD   25 mg at 08/28/19 1026  . metroNIDAZOLE (FLAGYL) tablet 500 mg  500 mg Oral Q8H Flora Lipps, MD   500 mg at 08/28/19 1541  . morphine 2 MG/ML injection 2-4 mg  2-4 mg Intravenous Q4H PRN Awilda Bill, NP   4 mg at 08/28/19 0255  . multivitamin with minerals tablet 1 tablet  1 tablet Oral Daily Mansy, Jan A, MD   1 tablet at 08/28/19 1025  . nicotine (NICODERM CQ - dosed in mg/24 hours) patch 14 mg  14 mg Transdermal Daily Harrie Foreman, MD   14 mg at 08/28/19 1025  . ondansetron (ZOFRAN) injection 4 mg  4 mg Intravenous Q6H PRN Stark Jock, Jude, MD   4 mg at 08/26/19 2252  . oxyCODONE-acetaminophen (PERCOCET/ROXICET) 5-325 MG per tablet 1-2 tablet  1-2 tablet Oral BID PRN Ottie Glazier, MD   2 tablet at 08/28/19 1025  . pantoprazole (PROTONIX) EC tablet 20 mg  20 mg Oral Daily Mansy, Jan A, MD   20 mg at 08/28/19 1026  . polyethylene glycol (MIRALAX / GLYCOLAX) packet 17 g  17 g Oral Daily Mansy, Jan A, MD   17 g at 08/26/19 0959  . QUEtiapine (SEROQUEL) tablet 200 mg  200 mg Oral BID Mansy, Jan A, MD   200 mg at 08/28/19 1025  . sodium chloride flush (NS) 0.9 % injection 10-40 mL  10-40 mL Intracatheter Q12H Ottie Glazier, MD   10 mL at 08/28/19 1033  . sodium chloride flush (NS) 0.9 % injection 10-40 mL  10-40 mL Intracatheter PRN Ottie Glazier, MD      . sodium chloride flush (NS) 0.9 % injection 10-40 mL  10-40 mL Intracatheter Q12H Ojie, Jude, MD   10 mL at 08/28/19 1032  . sodium chloride flush (NS) 0.9 % injection 10-40 mL  10-40 mL Intracatheter PRN Ojie, Jude, MD      . thiamine (VITAMIN B-1) tablet 100 mg  100 mg Oral Daily Mansy, Jan A, MD   100 mg at 08/28/19 1026  Musculoskeletal: Strength & Muscle Tone: within normal limits Gait & Station: normal Patient leans: N/A  Psychiatric Specialty  Exam: Physical Exam  Review of Systems  Constitutional: Negative for fever.  HENT: Negative for hearing loss.   Eyes: Negative for blurred vision.  Cardiovascular: Negative for chest pain.  Skin: Negative for rash.  Psychiatric/Behavioral: Positive for substance abuse. Negative for depression, hallucinations and suicidal ideas. The patient is not nervous/anxious and does not have insomnia.     Blood pressure 95/68, pulse 86, temperature 98 F (36.7 C), temperature source Oral, resp. rate (!) 28, height 5\' 4"  (1.626 m), weight 63.5 kg, last menstrual period 07/25/2019, SpO2 99 %.Body mass index is 24.03 kg/m.  General Appearance: Casual  Eye Contact:  Fair  Speech:  Clear and Coherent  Volume:  Decreased  Mood:  Euthymic  Affect:  Appropriate  Thought Process:  Coherent  Orientation:  Full (Time, Place, and Person)  Thought Content:  Logical  Suicidal Thoughts:  No  Homicidal Thoughts:  No  Memory:  Recent;   Good  Judgement:  Fair  Insight:  Fair  Psychomotor Activity:  Normal  Concentration:  Concentration: Fair  Recall:  Good  Fund of Knowledge:  Good  Language:  Good  Akathisia:  No  Handed:  Right  AIMS (if indicated):     Assets:  Communication Skills Desire for Improvement Housing Leisure Time Physical Health Resilience  ADL's:  Intact  Cognition:  WNL  Sleep:        Assessment: 43 year old female with a past history of schizophrenia who presents in alcohol withdrawal.  Despite the patient's history, she is able to clearly explain the circumstances surrounding her admission and show remorse for her actions.  Patient is also currently engaged in outpatient care and shows good insight into her illness.  Patient is therefore able to make her own decisions.  Will rescind IVC.  Patient is not for psychiatric admission.  No further psychiatric management  Disposition: No evidence of imminent risk to self or others at present.   Patient does not meet criteria for  psychiatric inpatient admission. Supportive therapy provided about ongoing stressors. Discussed crisis plan, support from social network, calling 911, coming to the Emergency Department, and calling Suicide Hotline.  Dixie Dials, MD 08/28/2019 4:38 PM

## 2019-08-29 LAB — CULTURE, BLOOD (ROUTINE X 2)
Culture: NO GROWTH
Culture: NO GROWTH
Special Requests: ADEQUATE
Special Requests: ADEQUATE

## 2019-08-29 LAB — HEPARIN INDUCED PLATELET AB (HIT ANTIBODY): Heparin Induced Plt Ab: 0.084 OD (ref 0.000–0.400)

## 2019-08-29 NOTE — Discharge Summary (Signed)
[facility] NAME: Sarah Serrano    MR#:  [id number]  DATE OF BIRTH:  01-27-1976  DATE OF ADMISSION:  08/24/2019   ADMITTING PHYSICIAN: Christel Mormon, MD  DATE OF DISCHARGE: 08/28/2019  8:06 PM  PRIMARY CARE PHYSICIAN: Patient, No Pcp Per   ADMISSION DIAGNOSIS:  Alcohol abuse [F10.10] Back pain [M54.9] Colitis [K52.9] Acute pyelonephritis [N10] Acute colitis [K52.9] DISCHARGE DIAGNOSIS:  Active Problems:   Acute colitis   Alcohol abuse   Undifferentiated schizophrenia ([place])  SECONDARY DIAGNOSIS:   Past Medical History:  Diagnosis Date  . Alcohol abuse   . Bipolar 1 disorder ([place])   . Pancreatitis   . Substance abuse ([place])   . Thrombocytopenia [facility])    HOSPITAL COURSE:   Patient left AGAINST MEDICAL ADVICE  Chief complaint; left flank pain with nausea and vomiting diarrhea  History of presenting complaint; Sarah Serrano  is a 43 y.o. female with a known history of alcohol abuse, alcohol withdrawal seizures, bipolar disorder and pancreatitis, who presented to the emergency room with acute onset of left-sided flank pain with associated nausea and occasional vomiting and diarrhea as well as urinary frequency and urgency with occasional dysuria. She was seen in the ER on 10/23 and diagnosed with colitis and was managed on outpatient basis. Upon presentation to the emergency room, blood pressure was 140/100 with a pulse of 160 and otherwise normal vital signs.  Later blood pressure was 171/112.  Labs were remarkable for an AST of 45, anion gap of 17 and urine drug screen that was positive for cannabinoids and tricyclic's.  Urine pregnancy test was negative.  Her alcohol level was 399. Patient admitted to the medical service for further evaluation and management.  Hospital course; 1. Acute Colitis.   Patient was treated with IV antibiotics with IV Rocephin which she has completed.  As well as IV Flagyl.  Clinically  improved.  IV hydration , pain management and stool studies. 2. UTI with left pyelonephritis with complaint of flank pain.  Patient was treated with IV antibiotics with Rocephin.  3. Hypertensive urgency. IV Labetalol prn and resume lopressor. Blood pressure better controlled 4. Alcohol intoxication on admission and now in withdrawal CIWA monitoring, prn Ativan and banana bag daily, scheduled Librium. Precedex as needed.  Patient was being managed in the ICU 5. Seizure, likely alcohol withdrawal. Resume Keppra. 6.  Thrombocytopenia Noted drop in platelet count to 83.  No bleeding. Possibly related to patient's alcohol abuse.  Initial plan was to transfer patient out of ICU with plans for discharge the next day.  On my arrival this morning patient noted to have signed left [place] last night.  DISCHARGE CONDITIONS:  Left AGAINST MEDICAL ADVICE CONSULTS OBTAINED:  Treatment Team:  Pccm, Ander Gaster, MD Dixie Dials, MD DRUG ALLERGIES:   Allergies  Allergen Reactions  . Heparin     HIT Ab pending     On the day of Discharge:  VITAL SIGNS:  Blood pressure 95/68, pulse 86, temperature 98 F (36.7 C), temperature source Oral, resp. rate (!) 28, height 5\' 4"  (1.626 m), weight 63.5 kg, last menstrual period 07/25/2019, SpO2 99 %. PHYSICAL EXAMINATION:   DATA REVIEW:   CBC Recent Labs  Lab 08/28/19 0236  WBC 6.5  HGB 9.7*  HCT 29.8*  PLT 83*    Chemistries  Recent Labs  Lab 08/24/19 1508  08/28/19 0236  NA 141   < > 138  K 3.1*   < > 3.7  CL 101   < > 111  CO2 24   < > 21*  GLUCOSE 100*   < > 120*  BUN 6   < > 5*  CREATININE 0.42*   < > 0.53  CALCIUM 7.8*   < > 8.4*  MG 1.4*   < > 1.9  AST 57*  --   --   ALT 29  --   --   ALKPHOS 52  --   --   BILITOT 1.0  --   --    < > = values in this interval not displayed.     Microbiology Results  Results for orders placed or performed during the hospital encounter of 08/24/19  SARS  CORONAVIRUS 2 (TAT 6-24 HRS) Nasopharyngeal Nasopharyngeal Swab     Status: None   Collection Time: 08/24/19  6:04 AM   Specimen: Nasopharyngeal Swab  Result Value Ref Range Status   SARS Coronavirus 2 NEGATIVE NEGATIVE Final    Comment: (NOTE) SARS-CoV-2 target nucleic acids are NOT DETECTED. The SARS-CoV-2 RNA is generally detectable in upper and lower respiratory specimens during the acute phase of infection. Negative results do not preclude SARS-CoV-2 infection, do not rule out co-infections with other pathogens, and should not be used as the sole basis for treatment or other patient management decisions. Negative results must be combined with clinical observations, patient history, and epidemiological information. The expected result is Negative. Fact Sheet for Patients: SugarRoll.be Fact Sheet for Healthcare Providers: https://www.woods-mathews.com/ This test is not yet approved or cleared by the [place] FDA and  has been authorized for detection and/or diagnosis of SARS-CoV-2 by FDA under an Emergency Use Authorization (EUA). This EUA will remain  in effect (meaning this test can be used) for the duration of the COVID-19 declaration under Section 56 4(b)(1) of the Act, 21 U.S.C. section 360bbb-3(b)(1), unless the authorization is terminated or revoked sooner. Performed at [facility], [place] [address], [place], [place] [address]   SARS Coronavirus 2 by RT PCR (hospital order, performed in [facility] hospital lab) Nasopharyngeal Nasopharyngeal Swab     Status: None   Collection Time: 08/24/19 10:44 AM   Specimen: Nasopharyngeal Swab  Result Value Ref Range Status   SARS Coronavirus 2 NEGATIVE NEGATIVE Final    Comment: (NOTE) If result is NEGATIVE SARS-CoV-2 target nucleic acids are NOT DETECTED. The SARS-CoV-2 RNA is generally detectable in upper and lower  respiratory specimens during the acute phase of infection. The  lowest  concentration of SARS-CoV-2 viral copies this assay can detect is 250  copies / mL. A negative result does not preclude SARS-CoV-2 infection  and should not be used as the sole basis for treatment or other  patient management decisions.  A negative result may occur with  improper specimen collection / handling, submission of specimen other  than nasopharyngeal swab, presence of viral mutation(s) within the  areas targeted by this assay, and inadequate number of viral copies  (<250 copies / mL). A negative result must be combined with clinical  observations, patient history, and epidemiological information. If result is POSITIVE SARS-CoV-2 target nucleic acids are DETECTED. The SARS-CoV-2 RNA is generally detectable in upper and lower  respiratory specimens dur ing the acute phase of infection.  Positive  results are indicative of active infection with SARS-CoV-2.  Clinical  correlation with patient history and other diagnostic information is  necessary to determine patient infection status.  Positive results  do  not rule out bacterial infection or co-infection with other viruses. If result is PRESUMPTIVE POSTIVE SARS-CoV-2 nucleic acids MAY BE PRESENT.   A presumptive positive result was obtained on the submitted specimen  and confirmed on repeat testing.  While 2019 novel coronavirus  (SARS-CoV-2) nucleic acids may be present in the submitted sample  additional confirmatory testing may be necessary for epidemiological  and / or clinical management purposes  to differentiate between  SARS-CoV-2 and other Sarbecovirus currently known to infect humans.  If clinically indicated additional testing with an alternate test  methodology [phone]) is advised. The SARS-CoV-2 RNA is generally  detectable in upper and lower respiratory sp ecimens during the acute  phase of infection. The expected result is Negative. Fact Sheet for Patients:  StrictlyIdeas.no  Fact Sheet for Healthcare Providers: BankingDealers.co.za This test is not yet approved or cleared by the [place] FDA and has been authorized for detection and/or diagnosis of SARS-CoV-2 by FDA under an Emergency Use Authorization (EUA).  This EUA will remain in effect (meaning this test can be used) for the duration of the COVID-19 declaration under Section 564(b)(1) of the Act, 21 U.S.C. section 360bbb-3(b)(1), unless the authorization is terminated or revoked sooner. Performed at [facility], [address] [place], [place] [address]   CULTURE, BLOOD (ROUTINE X 2) w Reflex to ID Panel     Status: None   Collection Time: 08/24/19  5:21 PM   Specimen: BLOOD  Result Value Ref Range Status   Specimen Description BLOOD BLOOD LEFT WRIST  Final   Special Requests   Final    BOTTLES DRAWN AEROBIC AND ANAEROBIC Blood Culture adequate volume   Culture   Final    NO GROWTH 5 DAYS Performed at [facility], [address] [place], [place] [address]    Report Status 08/29/2019 FINAL  Final  CULTURE, BLOOD (ROUTINE X 2) w Reflex to ID Panel     Status: None   Collection Time: 08/24/19  6:22 PM   Specimen: BLOOD  Result Value Ref Range Status   Specimen Description BLOOD RIGHT MIDLINE  Final   Special Requests   Final    BOTTLES DRAWN AEROBIC AND ANAEROBIC Blood Culture adequate volume   Culture   Final    NO GROWTH 5 DAYS Performed at [facility], [address], [place], [place] [address]    Report Status 08/29/2019 FINAL  Final  MRSA PCR Screening     Status: None   Collection Time: 08/27/19  9:24 AM   Specimen: Nasopharyngeal  Result Value Ref Range Status   MRSA by PCR NEGATIVE NEGATIVE Final    Comment:        The GeneXpert MRSA Assay (FDA approved for NASAL specimens only), is one component of a comprehensive MRSA colonization surveillance program. It is not intended to diagnose MRSA infection nor to guide or  monitor treatment for MRSA infections. Performed at [facility], [address], [place],  [address]     RADIOLOGY:  No results found.   Amire Gossen M.D on 08/29/2019 at 7:46 AM  Between 7am to 6pm - Pager - [phone]  After 6pm go to [email or web address] - Proofreader  [facility]  Office  [phone]  CC: Primary care physician; Patient, No Pcp Per   Note: This dictation was prepared with Dragon dictation along with smaller phrase technology. Any transcriptional errors that result from this process are unintentional.

## 2019-11-12 ENCOUNTER — Emergency Department: Payer: No Typology Code available for payment source

## 2019-11-12 ENCOUNTER — Emergency Department
Admission: EM | Admit: 2019-11-12 | Discharge: 2019-11-13 | Disposition: A | Discharge status: Home / Self Care | Payer: No Typology Code available for payment source | Attending: Emergency Medicine | Admitting: Emergency Medicine

## 2019-11-12 ENCOUNTER — Other Ambulatory Visit: Payer: Self-pay

## 2019-11-12 DIAGNOSIS — F17210 Nicotine dependence, cigarettes, uncomplicated: Secondary | ICD-10-CM | POA: Insufficient documentation

## 2019-11-12 DIAGNOSIS — R410 Disorientation, unspecified: Secondary | ICD-10-CM | POA: Insufficient documentation

## 2019-11-12 DIAGNOSIS — F10920 Alcohol use, unspecified with intoxication, uncomplicated: Secondary | ICD-10-CM

## 2019-11-12 DIAGNOSIS — F10120 Alcohol abuse with intoxication, uncomplicated: Secondary | ICD-10-CM | POA: Insufficient documentation

## 2019-11-12 DIAGNOSIS — Z041 Encounter for examination and observation following transport accident: Secondary | ICD-10-CM | POA: Diagnosis present

## 2019-11-12 DIAGNOSIS — Z79899 Other long term (current) drug therapy: Secondary | ICD-10-CM | POA: Diagnosis not present

## 2019-11-12 LAB — URINALYSIS, COMPLETE (UACMP) WITH MICROSCOPIC
Bacteria, UA: NONE SEEN
Bilirubin Urine: NEGATIVE
Glucose, UA: NEGATIVE mg/dL
Hgb urine dipstick: NEGATIVE
Ketones, ur: NEGATIVE mg/dL
Leukocytes,Ua: NEGATIVE
Nitrite: NEGATIVE
Protein, ur: NEGATIVE mg/dL
Specific Gravity, Urine: 1.001 — ABNORMAL LOW (ref 1.005–1.030)
Squamous Epithelial / HPF: NONE SEEN (ref 0–5)
pH: 6 (ref 5.0–8.0)

## 2019-11-12 LAB — COMPREHENSIVE METABOLIC PANEL
ALT: 13 U/L (ref 0–44)
AST: 22 U/L (ref 15–41)
Albumin: 3.8 g/dL (ref 3.5–5.0)
Alkaline Phosphatase: 51 U/L (ref 38–126)
Anion gap: 15 (ref 5–15)
BUN: 6 mg/dL (ref 6–20)
CO2: 18 mmol/L — ABNORMAL LOW (ref 22–32)
Calcium: 9.6 mg/dL (ref 8.9–10.3)
Chloride: 110 mmol/L (ref 98–111)
Creatinine, Ser: 0.55 mg/dL (ref 0.44–1.00)
GFR calc Af Amer: >60 mL/min (ref >60)
GFR calc non Af Amer: >60 mL/min (ref >60)
Glucose, Bld: 130 mg/dL — ABNORMAL HIGH (ref 70–99)
Potassium: 3.1 mmol/L — ABNORMAL LOW (ref 3.5–5.1)
Sodium: 143 mmol/L (ref 135–145)
Total Bilirubin: 0.7 mg/dL (ref 0.3–1.2)
Total Protein: 8 g/dL (ref 6.5–8.1)

## 2019-11-12 LAB — CBC WITH DIFFERENTIAL/PLATELET
Abs Immature Granulocytes: 0.05 K/uL (ref 0.00–0.07)
Basophils Absolute: 0 K/uL (ref 0.0–0.1)
Basophils Relative: 0 %
Eosinophils Absolute: 0 K/uL (ref 0.0–0.5)
Eosinophils Relative: 0 %
HCT: 35.5 % — ABNORMAL LOW (ref 36.0–46.0)
Hemoglobin: 11.7 g/dL — ABNORMAL LOW (ref 12.0–15.0)
Immature Granulocytes: 1 %
Lymphocytes Relative: 14 %
Lymphs Abs: 1.4 K/uL (ref 0.7–4.0)
MCH: 29.9 pg (ref 26.0–34.0)
MCHC: 33 g/dL (ref 30.0–36.0)
MCV: 90.8 fL (ref 80.0–100.0)
Monocytes Absolute: 0.1 K/uL (ref 0.1–1.0)
Monocytes Relative: 1 %
Neutro Abs: 8.4 K/uL — ABNORMAL HIGH (ref 1.7–7.7)
Neutrophils Relative %: 84 %
Platelets: 254 K/uL (ref 150–400)
RBC: 3.91 MIL/uL (ref 3.87–5.11)
RDW: 13.8 % (ref 11.5–15.5)
WBC: 10 K/uL (ref 4.0–10.5)
nRBC: 0 % (ref 0.0–0.2)

## 2019-11-12 LAB — ETHANOL: Alcohol, Ethyl (B): 234 mg/dL — ABNORMAL HIGH

## 2019-11-12 LAB — SALICYLATE LEVEL: Salicylate Lvl: <7 mg/dL — ABNORMAL LOW (ref 7.0–30.0)

## 2019-11-12 LAB — URINE DRUG SCREEN, QUALITATIVE (ARMC ONLY)
Amphetamines, Ur Screen: NOT DETECTED
Barbiturates, Ur Screen: NOT DETECTED
Benzodiazepine, Ur Scrn: NOT DETECTED
Cannabinoid 50 Ng, Ur ~~LOC~~: POSITIVE — AB
Cocaine Metabolite,Ur ~~LOC~~: NOT DETECTED
MDMA (Ecstasy)Ur Screen: NOT DETECTED
Methadone Scn, Ur: NOT DETECTED
Opiate, Ur Screen: POSITIVE — AB
Phencyclidine (PCP) Ur S: NOT DETECTED
Tricyclic, Ur Screen: NOT DETECTED

## 2019-11-12 LAB — POCT PREGNANCY, URINE: Preg Test, Ur: NEGATIVE

## 2019-11-12 LAB — ACETAMINOPHEN LEVEL: Acetaminophen (Tylenol), Serum: <10 ug/mL — ABNORMAL LOW (ref 10–30)

## 2019-11-12 MED ORDER — POTASSIUM CHLORIDE CRYS ER 20 MEQ PO TBCR
40.0000 meq | EXTENDED_RELEASE_TABLET | Freq: Once | ORAL | Status: AC
  Administered 2019-11-12: 22:00:00 40 meq via ORAL
  Filled 2019-11-12: qty 2

## 2019-11-12 MED ORDER — DROPERIDOL 2.5 MG/ML IJ SOLN
5.0000 mg | Freq: Once | INTRAMUSCULAR | Status: AC
  Administered 2019-11-12: 16:00:00 5 mg via INTRAMUSCULAR
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
## 2019-11-12 NOTE — ED Notes (Signed)
Patient was standing up at the sink in her room stating she needed to use the restroom. I instructed patient and pointed to the rested. Patient stuck her hands down the back of her paints and pulled out BM and laid it in the sink and proceeded to the restroom.

## 2019-11-12 NOTE — ED Notes (Addendum)
Pt not able to take med due to pt sleeping.

## 2019-11-12 NOTE — ED Notes (Signed)
Informed pt. She was under IVC and she would be talking to TTS and NP when available.  Pt. Given snack tray and drink upon request.

## 2019-11-12 NOTE — ED Notes (Signed)
Citations from officers and information about court date in her possessions bag.

## 2019-11-12 NOTE — ED Notes (Signed)
Pt. Currently sleeping on bed in room #22.

## 2019-11-12 NOTE — ED Notes (Signed)
Pt being transported to CT at this time. 

## 2019-11-13 DIAGNOSIS — F10920 Alcohol use, unspecified with intoxication, uncomplicated: Secondary | ICD-10-CM | POA: Insufficient documentation
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
## 2019-11-13 NOTE — ED Notes (Signed)
BEHAVIORAL HEALTH ROUNDING Patient sleeping: No. Patient alert and oriented: yes Behavior appropriate: Yes.  ; If no, describe:  Nutrition and fluids offered: yes Toileting and hygiene offered: Yes  Sitter present: q15 minute observations  Law enforcement present: Yes  [employer]   ENVIRONMENTAL ASSESSMENT Potentially harmful objects out of patient reach: Yes.   Personal belongings secured: Yes.   Patient dressed in hospital provided attire only: Yes.   Plastic bags out of patient reach: Yes.   Patient care equipment (cords, cables, call bells, lines, and drains) shortened, removed, or accounted for: Yes.   Equipment and supplies removed from bottom of stretcher: Yes.   Potentially toxic materials out of patient reach: Yes.   Sharps container removed or out of patient reach: Yes.   

## 2019-11-13 NOTE — ED Notes (Signed)
ED BHU  Is the patient under IVC or is there intent for IVC: Yes - MD has rescinded     Is the patient medically cleared: Yes.   Is there vacancy in the ED BHU: Yes.   Is the population mix appropriate for patient: Yes.   Is the patient awaiting placement in inpatient or outpatient setting:   She will discharge to home  Has the patient had a psychiatric consult: Yes.   Survey of unit performed for contraband, proper placement and condition of furniture, tampering with fixtures in bathroom, shower, and each patient room: Yes.  ; Findings:  APPEARANCE/BEHAVIOR Calm and cooperative NEURO ASSESSMENT Orientation: oriented x3  Denies pain Hallucinations: No.None noted (Hallucinations) denies  Speech: Normal Gait: normal RESPIRATORY ASSESSMENT Even  Unlabored respirations  CARDIOVASCULAR ASSESSMENT Pulses equal   regular rate  Skin warm and dry   GASTROINTESTINAL ASSESSMENT no GI complaint EXTREMITIES Full ROM  PLAN OF CARE Provide calm/safe environment. Vital signs assessed twice daily. ED BHU Assessment once each 12-hour shift.  Assure the ED provider has rounded once each shift. Provide and encourage hygiene. Provide redirection as needed. Assess for escalating behavior; address immediately and inform ED provider.  Assess family dynamic and appropriateness for visitation as needed: Yes.  ; If necessary, describe findings:  Educate the patient/family about BHU procedures/visitation: Yes.  ; If necessary, describe findings:

## 2019-11-13 NOTE — ED Provider Notes (Signed)
-----------------------------------------   10:15 AM on 11/13/2019 -----------------------------------------  Per Dr. Claris Gower from psychiatry, the patient has been cleared for discharge.  He has rescinded the IVC.  Return precautions and discharge instructions provided.   Arta Silence, MD 11/13/19 1015
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
## 2019-11-13 NOTE — ED Notes (Signed)
Pt presents to the ER for evaluation  under IVC by the police. The patient was medicated due to behaviors; therefore, unable to assess at this time   Pt  BAL 234 opiates and THC on board

## 2019-11-13 NOTE — ED Notes (Signed)
BEHAVIORAL HEALTH ROUNDING Patient sleeping: Yes.   Patient alert and oriented: eyes closed  Appears asleep Behavior appropriate: Yes.  ; If no, describe:  Nutrition and fluids offered: Yes  Toileting and hygiene offered: sleeping Sitter present: q 15 minute observations  Law enforcement present: yes  [employer] 
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
## 2019-11-13 NOTE — ED Notes (Signed)
Patient observed lying in bed with eyes closed  Even, unlabored respirations observed   NAD pt appears to be sleeping  I will continue to monitor along with every 15 minute visual observations     

## 2019-11-13 NOTE — ED Notes (Signed)
esignature page not working at this time of discharge 

## 2019-11-13 NOTE — ED Notes (Signed)
Gave breakfast tray with juice.

## 2021-02-27 DEATH — deceased
